# Patient Record
Sex: Female | Born: 1965
Health system: Southern US, Community
[De-identification: ages and names within clinical notes are randomized; demographics above are authoritative.]

## PROBLEM LIST (undated history)

## (undated) DIAGNOSIS — E785 Hyperlipidemia, unspecified: Secondary | ICD-10-CM

## (undated) DIAGNOSIS — E669 Obesity, unspecified: Secondary | ICD-10-CM

## (undated) DIAGNOSIS — I10 Essential (primary) hypertension: Secondary | ICD-10-CM

## (undated) DIAGNOSIS — I219 Acute myocardial infarction, unspecified: Secondary | ICD-10-CM

## (undated) DIAGNOSIS — Z803 Family history of malignant neoplasm of breast: Secondary | ICD-10-CM

## (undated) HISTORY — DX: Acute myocardial infarction, unspecified: I21.9

## (undated) HISTORY — DX: Essential (primary) hypertension: I10

## (undated) HISTORY — DX: Family history of malignant neoplasm of breast: Z80.3

## (undated) HISTORY — PX: TUBAL LIGATION: SHX77

## (undated) HISTORY — DX: Hyperlipidemia, unspecified: E78.5

## (undated) HISTORY — PX: ABDOMINAL HYSTERECTOMY: SHX81

## (undated) HISTORY — DX: Obesity, unspecified: E66.9

---

## 2001-11-30 HISTORY — PX: VESICOVAGINAL FISTULA CLOSURE W/ TAH: SUR271

## 2001-11-30 HISTORY — PX: LEFT OOPHORECTOMY: SHX1961

## 2001-11-30 HISTORY — PX: APPENDECTOMY: SHX54

## 2002-04-05 ENCOUNTER — Inpatient Hospital Stay (HOSPITAL_COMMUNITY): Admission: RE | Admit: 2002-04-05 | Discharge: 2002-04-07 | Payer: Self-pay | Admitting: Internal Medicine

## 2003-02-21 ENCOUNTER — Ambulatory Visit (HOSPITAL_COMMUNITY): Admission: RE | Admit: 2003-02-21 | Discharge: 2003-02-21 | Payer: Self-pay | Admitting: Internal Medicine

## 2003-02-21 ENCOUNTER — Encounter: Payer: Self-pay | Admitting: Internal Medicine

## 2004-08-29 ENCOUNTER — Ambulatory Visit (HOSPITAL_COMMUNITY): Admission: RE | Admit: 2004-08-29 | Discharge: 2004-08-29 | Payer: Self-pay | Admitting: Internal Medicine

## 2005-03-09 ENCOUNTER — Encounter (INDEPENDENT_AMBULATORY_CARE_PROVIDER_SITE_OTHER): Payer: Self-pay | Admitting: *Deleted

## 2005-03-09 LAB — CONVERTED CEMR LAB: Pap Smear: NORMAL

## 2006-12-22 ENCOUNTER — Ambulatory Visit: Payer: Self-pay | Admitting: Family Medicine

## 2007-03-07 ENCOUNTER — Encounter: Payer: Self-pay | Admitting: Family Medicine

## 2007-03-07 LAB — CONVERTED CEMR LAB
Basophils Absolute: 0 10*3/uL (ref 0.0–0.1)
Basophils Relative: 1 % (ref 0–1)
Eosinophils Absolute: 0.3 10*3/uL (ref 0.0–0.7)
Eosinophils Relative: 4 % (ref 0–5)
HCT: 37.8 % (ref 36.0–46.0)
Hemoglobin: 12.7 g/dL (ref 12.0–15.0)
Lymphocytes Relative: 31 % (ref 12–46)
Lymphs Abs: 1.9 10*3/uL (ref 0.7–3.3)
MCHC: 33.6 g/dL (ref 30.0–36.0)
MCV: 94 fL (ref 78.0–100.0)
Monocytes Absolute: 0.6 10*3/uL (ref 0.2–0.7)
Monocytes Relative: 10 % (ref 3–11)
Neutro Abs: 3.3 10*3/uL (ref 1.7–7.7)
Neutrophils Relative %: 55 % (ref 43–77)
Platelets: 372 10*3/uL (ref 150–400)
RBC: 4.02 M/uL (ref 3.87–5.11)
RDW: 13.7 % (ref 11.5–14.0)
WBC: 6.1 10*3/uL (ref 4.0–10.5)

## 2007-03-10 ENCOUNTER — Encounter: Payer: Self-pay | Admitting: Family Medicine

## 2007-03-10 ENCOUNTER — Ambulatory Visit: Payer: Self-pay | Admitting: Family Medicine

## 2007-03-10 ENCOUNTER — Other Ambulatory Visit: Admission: RE | Admit: 2007-03-10 | Discharge: 2007-03-10 | Payer: Self-pay | Admitting: Family Medicine

## 2007-03-10 LAB — CONVERTED CEMR LAB
Bilirubin Urine: NEGATIVE
Chlamydia, DNA Probe: NEGATIVE
GC Probe Amp, Genital: NEGATIVE
Hemoglobin, Urine: NEGATIVE
Ketones, ur: NEGATIVE mg/dL
Leukocytes, UA: NEGATIVE
Nitrite: NEGATIVE
Protein, ur: NEGATIVE mg/dL
Specific Gravity, Urine: 1.022 (ref 1.005–1.03)
Urine Glucose: NEGATIVE mg/dL
Urobilinogen, UA: 0.2 (ref 0.0–1.0)
pH: 5.5 (ref 5.0–8.0)

## 2007-03-11 ENCOUNTER — Encounter: Payer: Self-pay | Admitting: Family Medicine

## 2007-03-11 LAB — CONVERTED CEMR LAB
Candida species: NEGATIVE
Gardnerella vaginalis: NEGATIVE
Trichomonal Vaginitis: NEGATIVE

## 2007-08-19 ENCOUNTER — Encounter: Payer: Self-pay | Admitting: Family Medicine

## 2007-08-19 LAB — CONVERTED CEMR LAB
Cholesterol: 218 mg/dL — ABNORMAL HIGH (ref 0–200)
HDL: 45 mg/dL (ref >39)
LDL Cholesterol: 131 mg/dL — ABNORMAL HIGH (ref 0–99)
Total CHOL/HDL Ratio: 4.8
Triglycerides: 212 mg/dL — ABNORMAL HIGH (ref <150)
VLDL: 42 mg/dL — ABNORMAL HIGH (ref 0–40)

## 2007-08-22 ENCOUNTER — Ambulatory Visit: Payer: Self-pay | Admitting: Family Medicine

## 2007-08-29 ENCOUNTER — Ambulatory Visit: Payer: Self-pay | Admitting: Family Medicine

## 2007-11-12 ENCOUNTER — Encounter (INDEPENDENT_AMBULATORY_CARE_PROVIDER_SITE_OTHER): Payer: Self-pay | Admitting: *Deleted

## 2007-11-17 ENCOUNTER — Ambulatory Visit: Payer: Self-pay | Admitting: Family Medicine

## 2008-03-16 ENCOUNTER — Encounter: Payer: Self-pay | Admitting: Family Medicine

## 2008-03-16 LAB — CONVERTED CEMR LAB
Cholesterol: 220 mg/dL — ABNORMAL HIGH (ref 0–200)
HDL: 46 mg/dL (ref >39)
Total CHOL/HDL Ratio: 4.8

## 2008-03-19 ENCOUNTER — Ambulatory Visit: Payer: Self-pay | Admitting: Family Medicine

## 2008-03-19 ENCOUNTER — Other Ambulatory Visit: Admission: RE | Admit: 2008-03-19 | Discharge: 2008-03-19 | Payer: Self-pay | Admitting: Family Medicine

## 2008-03-19 ENCOUNTER — Encounter: Payer: Self-pay | Admitting: Family Medicine

## 2008-03-19 ENCOUNTER — Encounter (INDEPENDENT_AMBULATORY_CARE_PROVIDER_SITE_OTHER): Payer: Self-pay | Admitting: *Deleted

## 2008-03-20 ENCOUNTER — Encounter: Payer: Self-pay | Admitting: Family Medicine

## 2008-03-20 LAB — CONVERTED CEMR LAB
Chlamydia, DNA Probe: NEGATIVE
Gardnerella vaginalis: NEGATIVE
Trichomonal Vaginitis: NEGATIVE

## 2008-03-29 DIAGNOSIS — J309 Allergic rhinitis, unspecified: Secondary | ICD-10-CM | POA: Insufficient documentation

## 2008-03-29 DIAGNOSIS — E785 Hyperlipidemia, unspecified: Secondary | ICD-10-CM

## 2008-03-29 DIAGNOSIS — I10 Essential (primary) hypertension: Secondary | ICD-10-CM

## 2008-04-09 ENCOUNTER — Ambulatory Visit: Payer: Self-pay | Admitting: Family Medicine

## 2008-08-11 ENCOUNTER — Encounter: Payer: Self-pay | Admitting: Family Medicine

## 2008-08-13 LAB — CONVERTED CEMR LAB
BUN: 17 mg/dL (ref 6–23)
Basophils Relative: 0 % (ref 0–1)
Calcium: 9.8 mg/dL (ref 8.4–10.5)
Cholesterol: 212 mg/dL — ABNORMAL HIGH (ref 0–200)
Creatinine, Ser: 0.83 mg/dL (ref 0.40–1.20)
Eosinophils Absolute: 0.1 10*3/uL (ref 0.0–0.7)
Eosinophils Relative: 2 % (ref 0–5)
Glucose, Bld: 100 mg/dL — ABNORMAL HIGH (ref 70–99)
HCT: 37 % (ref 36.0–46.0)
Lymphs Abs: 1.5 10*3/uL (ref 0.7–4.0)
MCHC: 33 g/dL (ref 30.0–36.0)
MCV: 95.4 fL (ref 78.0–100.0)
Monocytes Relative: 9 % (ref 3–12)
Platelets: 364 10*3/uL (ref 150–400)
RBC: 3.88 M/uL (ref 3.87–5.11)
Triglycerides: 157 mg/dL — ABNORMAL HIGH (ref <150)
WBC: 4.8 10*3/uL (ref 4.0–10.5)

## 2008-08-15 ENCOUNTER — Telehealth: Payer: Self-pay | Admitting: Family Medicine

## 2008-08-15 ENCOUNTER — Telehealth (INDEPENDENT_AMBULATORY_CARE_PROVIDER_SITE_OTHER): Payer: Self-pay | Admitting: *Deleted

## 2008-08-20 ENCOUNTER — Encounter: Payer: Self-pay | Admitting: Family Medicine

## 2008-08-20 LAB — HM MAMMOGRAPHY: HM Mammogram: NORMAL

## 2008-09-18 ENCOUNTER — Encounter: Payer: Self-pay | Admitting: Family Medicine

## 2008-10-01 ENCOUNTER — Ambulatory Visit: Payer: Self-pay | Admitting: Family Medicine

## 2009-02-09 ENCOUNTER — Encounter: Payer: Self-pay | Admitting: Family Medicine

## 2009-02-09 LAB — CONVERTED CEMR LAB
BUN: 22 mg/dL (ref 6–23)
Bilirubin, Direct: 0.1 mg/dL (ref 0.0–0.3)
CO2: 26 meq/L (ref 19–32)
Chloride: 101 meq/L (ref 96–112)
Glucose, Bld: 94 mg/dL (ref 70–99)
Indirect Bilirubin: 0.4 mg/dL (ref 0.0–0.9)
LDL Cholesterol: 140 mg/dL — ABNORMAL HIGH (ref 0–99)
Potassium: 3.8 meq/L (ref 3.5–5.3)
Total Bilirubin: 0.5 mg/dL (ref 0.3–1.2)
Total Protein: 7.4 g/dL (ref 6.0–8.3)
Triglycerides: 181 mg/dL — ABNORMAL HIGH (ref <150)
VLDL: 36 mg/dL (ref 0–40)

## 2009-02-11 ENCOUNTER — Other Ambulatory Visit: Admission: RE | Admit: 2009-02-11 | Discharge: 2009-02-11 | Payer: Self-pay | Admitting: Family Medicine

## 2009-02-11 ENCOUNTER — Encounter: Payer: Self-pay | Admitting: Family Medicine

## 2009-02-11 ENCOUNTER — Ambulatory Visit: Payer: Self-pay | Admitting: Family Medicine

## 2009-02-11 LAB — CONVERTED CEMR LAB
Bilirubin Urine: NEGATIVE
Blood in Urine, dipstick: NEGATIVE
Ketones, urine, test strip: NEGATIVE
Urobilinogen, UA: 0.2

## 2009-02-12 ENCOUNTER — Encounter: Payer: Self-pay | Admitting: Family Medicine

## 2009-02-12 LAB — CONVERTED CEMR LAB
Candida species: NEGATIVE
GC Probe Amp, Genital: NEGATIVE
Trichomonal Vaginitis: NEGATIVE

## 2009-04-02 ENCOUNTER — Encounter: Payer: Self-pay | Admitting: Family Medicine

## 2009-04-08 ENCOUNTER — Telehealth: Payer: Self-pay | Admitting: Family Medicine

## 2009-05-13 ENCOUNTER — Ambulatory Visit: Payer: Self-pay | Admitting: Family Medicine

## 2009-05-13 DIAGNOSIS — F329 Major depressive disorder, single episode, unspecified: Secondary | ICD-10-CM

## 2009-06-05 ENCOUNTER — Encounter: Payer: Self-pay | Admitting: Family Medicine

## 2009-08-12 ENCOUNTER — Ambulatory Visit: Payer: Self-pay | Admitting: Family Medicine

## 2009-08-12 DIAGNOSIS — M2550 Pain in unspecified joint: Secondary | ICD-10-CM | POA: Insufficient documentation

## 2009-09-02 ENCOUNTER — Encounter: Payer: Self-pay | Admitting: Family Medicine

## 2009-09-12 ENCOUNTER — Ambulatory Visit: Payer: Self-pay | Admitting: Family Medicine

## 2009-09-12 DIAGNOSIS — G43909 Migraine, unspecified, not intractable, without status migrainosus: Secondary | ICD-10-CM

## 2009-09-16 ENCOUNTER — Ambulatory Visit: Payer: Self-pay | Admitting: Family Medicine

## 2009-09-16 ENCOUNTER — Telehealth: Payer: Self-pay | Admitting: Family Medicine

## 2009-09-20 ENCOUNTER — Encounter: Payer: Self-pay | Admitting: Family Medicine

## 2009-09-24 ENCOUNTER — Encounter: Payer: Self-pay | Admitting: Family Medicine

## 2009-10-01 ENCOUNTER — Encounter: Payer: Self-pay | Admitting: Family Medicine

## 2009-10-03 ENCOUNTER — Telehealth: Payer: Self-pay | Admitting: Family Medicine

## 2009-11-11 ENCOUNTER — Ambulatory Visit: Payer: Self-pay | Admitting: Family Medicine

## 2009-11-11 LAB — CONVERTED CEMR LAB
CO2: 24 meq/L (ref 19–32)
Calcium: 9.9 mg/dL (ref 8.4–10.5)
Creatinine, Ser: 0.87 mg/dL (ref 0.40–1.20)
Glucose, Bld: 97 mg/dL (ref 70–99)

## 2009-11-25 DIAGNOSIS — E669 Obesity, unspecified: Secondary | ICD-10-CM

## 2010-02-20 ENCOUNTER — Telehealth: Payer: Self-pay | Admitting: Family Medicine

## 2010-02-24 ENCOUNTER — Ambulatory Visit: Payer: Self-pay | Admitting: Family Medicine

## 2010-02-24 ENCOUNTER — Other Ambulatory Visit: Admission: RE | Admit: 2010-02-24 | Discharge: 2010-02-24 | Payer: Self-pay | Admitting: Family Medicine

## 2010-02-24 ENCOUNTER — Telehealth: Payer: Self-pay | Admitting: Family Medicine

## 2010-02-24 LAB — CONVERTED CEMR LAB
Basophils Absolute: 0 10*3/uL (ref 0.0–0.1)
Cholesterol: 213 mg/dL — ABNORMAL HIGH (ref 0–200)
Hemoglobin: 13.4 g/dL (ref 12.0–15.0)
Lymphocytes Relative: 29 % (ref 12–46)
Neutro Abs: 3.2 10*3/uL (ref 1.7–7.7)
Platelets: 394 10*3/uL (ref 150–400)
Potassium: 4 meq/L (ref 3.5–5.3)
RDW: 13.8 % (ref 11.5–15.5)
Sodium: 138 meq/L (ref 135–145)
Total CHOL/HDL Ratio: 5.8
Triglycerides: 196 mg/dL — ABNORMAL HIGH (ref <150)
VLDL: 39 mg/dL (ref 0–40)

## 2010-02-25 ENCOUNTER — Telehealth: Payer: Self-pay | Admitting: Family Medicine

## 2010-02-25 LAB — CONVERTED CEMR LAB
ALT: 13 units/L (ref 0–35)
AST: 18 units/L (ref 0–37)
Albumin: 4.5 g/dL (ref 3.5–5.2)
Alkaline Phosphatase: 34 units/L — ABNORMAL LOW (ref 39–117)
Bilirubin, Direct: 0.1 mg/dL (ref 0.0–0.3)
Hgb A1c MFr Bld: 6.2 % — ABNORMAL HIGH (ref 4.6–6.1)
Indirect Bilirubin: 0.3 mg/dL (ref 0.0–0.9)
Total Bilirubin: 0.4 mg/dL (ref 0.3–1.2)
Total Protein: 7.4 g/dL (ref 6.0–8.3)
Vit D, 25-Hydroxy: 23 ng/mL — ABNORMAL LOW (ref 30–89)

## 2010-04-16 ENCOUNTER — Telehealth: Payer: Self-pay | Admitting: Physician Assistant

## 2010-04-22 ENCOUNTER — Telehealth: Payer: Self-pay | Admitting: Family Medicine

## 2010-06-09 ENCOUNTER — Ambulatory Visit: Payer: Self-pay | Admitting: Family Medicine

## 2010-06-09 DIAGNOSIS — R7303 Prediabetes: Secondary | ICD-10-CM

## 2010-06-16 LAB — CONVERTED CEMR LAB
ALT: 16 units/L (ref 0–35)
BUN: 16 mg/dL (ref 6–23)
Bilirubin, Direct: 0.1 mg/dL (ref 0.0–0.3)
Cholesterol: 212 mg/dL — ABNORMAL HIGH (ref 0–200)
Glucose, Bld: 93 mg/dL (ref 70–99)
Indirect Bilirubin: 0.5 mg/dL (ref 0.0–0.9)
Potassium: 3.8 meq/L (ref 3.5–5.3)
Total Bilirubin: 0.6 mg/dL (ref 0.3–1.2)
VLDL: 42 mg/dL — ABNORMAL HIGH (ref 0–40)

## 2010-07-07 ENCOUNTER — Ambulatory Visit: Payer: Self-pay | Admitting: Family Medicine

## 2010-07-09 ENCOUNTER — Encounter: Payer: Self-pay | Admitting: Family Medicine

## 2010-10-06 ENCOUNTER — Encounter: Payer: Self-pay | Admitting: Family Medicine

## 2010-10-09 ENCOUNTER — Telehealth (INDEPENDENT_AMBULATORY_CARE_PROVIDER_SITE_OTHER): Payer: Self-pay | Admitting: *Deleted

## 2010-10-11 LAB — CONVERTED CEMR LAB
Albumin: 4.6 g/dL (ref 3.5–5.2)
Bilirubin, Direct: <0.1 mg/dL (ref 0.0–0.3)
CO2: 26 meq/L (ref 19–32)
Calcium: 9.6 mg/dL (ref 8.4–10.5)
Chloride: 103 meq/L (ref 96–112)
Glucose, Bld: 91 mg/dL (ref 70–99)
Hgb A1c MFr Bld: 5.5 % (ref <5.7)
LDL Cholesterol: 123 mg/dL — ABNORMAL HIGH (ref 0–99)
Potassium: 3.7 meq/L (ref 3.5–5.3)
Sodium: 138 meq/L (ref 135–145)
Total Bilirubin: 0.7 mg/dL (ref 0.3–1.2)
Total CHOL/HDL Ratio: 3.9
VLDL: 29 mg/dL (ref 0–40)

## 2010-10-13 ENCOUNTER — Ambulatory Visit: Payer: Self-pay | Admitting: Family Medicine

## 2010-10-13 DIAGNOSIS — R928 Other abnormal and inconclusive findings on diagnostic imaging of breast: Secondary | ICD-10-CM | POA: Insufficient documentation

## 2010-10-13 DIAGNOSIS — M25519 Pain in unspecified shoulder: Secondary | ICD-10-CM | POA: Insufficient documentation

## 2010-10-14 ENCOUNTER — Encounter: Payer: Self-pay | Admitting: Family Medicine

## 2010-10-16 ENCOUNTER — Telehealth: Payer: Self-pay | Admitting: Family Medicine

## 2010-10-19 DIAGNOSIS — J209 Acute bronchitis, unspecified: Secondary | ICD-10-CM

## 2010-10-21 ENCOUNTER — Encounter: Payer: Self-pay | Admitting: Family Medicine

## 2010-12-21 ENCOUNTER — Encounter: Payer: Self-pay | Admitting: Family Medicine

## 2010-12-29 ENCOUNTER — Telehealth: Payer: Self-pay | Admitting: Family Medicine

## 2010-12-30 ENCOUNTER — Ambulatory Visit: Admit: 2010-12-30 | Payer: Self-pay | Admitting: Family Medicine

## 2010-12-30 NOTE — Letter (Signed)
Summary: FMLA PAPER  FMLA PAPER   Imported By: Lind Guest 07/09/2010 10:34:20  _____________________________________________________________________  External Attachment:    Type:   Image     Comment:   External Document

## 2010-12-30 NOTE — Progress Notes (Signed)
Summary: lab order  Phone Note Call from Patient   Summary of Call: pt needs lab work faxed over so she can do sat. [phone] Initial call taken by: Rudene Anda,  February 20, 2010 10:34 AM  Follow-up for Phone Call        ORDER SENT Follow-up by: Adella Hare LPN,  February 20, 2010 11:32 AM

## 2010-12-30 NOTE — Progress Notes (Signed)
  Phone Note From Pharmacy   Caller: [facility] Summary of Call: lipitor requires pa Initial call taken by: Adella Hare LPN,  February 25, 2010 12:10 PM  Follow-up for Phone Call        She wasn't able to use the card yesterday. The Lipitor needed a PA and they said that we needed to call the insurance company to see why the card wasn't working. I told you that you may just want to change it. Follow-up by: Everitt Amber LPN,  February 25, 2010 4:03 PM  Additional Follow-up for Phone Call Additional follow up Details #1::        changed top pravastatin, I will erx and pls let her know Additional Follow-up by: Syliva Overman MD,  February 25, 2010 8:39 PM    Additional Follow-up for Phone Call Additional follow up Details #2::    patient aware Follow-up by: Everitt Amber LPN,  February 26, 2010 3:06 PM  New/Updated Medications: PRAVASTATIN SODIUM 40 MG TABS (PRAVASTATIN SODIUM) Take 1 tab by mouth at bedtime Prescriptions: PRAVASTATIN SODIUM 40 MG TABS (PRAVASTATIN SODIUM) Take 1 tab by mouth at bedtime  #30 x 3   Entered and Authorized by:   Syliva Overman MD   Signed by:   Syliva Overman MD on 02/25/2010   Method used:   Electronically to        [facility] (retail)       [address]       [place], [place]  [address]       Ph: [phone]       Fax: [phone]   RxID:   [id number]

## 2010-12-30 NOTE — Assessment & Plan Note (Signed)
Summary: WOULD LIKE TO DISCUSS FMLA PAPERS YOU FILLED OUT/SLJ   Vital Signs:  Patient profile:   45 year old female Menstrual status:  hysterectomy Height:      66.5 inches Weight:      180 pounds BMI:     28.72 O2 Sat:      98 % Pulse rate:   72 / minute Pulse rhythm:   regular Resp:     16 per minute BP sitting:   120 / 80  (left arm)  Vitals Entered By: Everitt Amber LPN (July 07, 2010 2:07 PM)  Nutrition Counseling: Patient's BMI is greater than 25 and therefore counseled on weight management options. CC: FMLA papers   Primary Care Darneshia Demary:  Syliva Overman MD  CC:  FMLA papers.  History of Present Illness: Reports  that tshe has been doing fairly well. She continues to be stressed helping to caRE FOR HER GRANDSON, HER DAUGHTER'S HEALTHIS IMOPROVING SLOWLY, ABOUT WHICH SHE IS HAPPY. Denies recent fever or chills. Denies sinus pressure, nasal congestion , ear pain or sore throat. Denies chest congestion, or cough productive of sputum. Denies chest pain, palpitations, PND, orthopnea or leg swelling. Denies abdominal pain, nausea, vomitting, diarrhea or constipation. Denies change in bowel movements or bloody stool. Denies dysuria , frequency, incontinence or hesitancy. Denies  joint pain, swelling, or reduced mobility. Denies headaches, vertigo, seizures. Denies depression, anxiety or insomnia. Denies  rash, lesions, or itch. she is also requesting FMLA forms to be completed which will bedue on October     Allergies: No Known Drug Allergies  Review of Systems      See HPI General:  Complains of fatigue. Eyes:  Complains of blurring and discharge. Endo:  Denies cold intolerance, excessive thirst, excessive urination, and polyuria. Heme:  Denies abnormal bruising and bleeding. Allergy:  Complains of seasonal allergies; denies persistent infections.  Physical Exam  General:  Well-developed,well-nourished,in no acute distress; alert,appropriate and cooperative  throughout examination HEENT: No facial asymmetry,  EOMI, No sinus tenderness, TM's Clear, oropharynx  pink and moist.   Chest: Clear to auscultation bilaterally.  CVS: S1, S2, No murmurs, No S3.   Abd: Soft, Nontender.  MS: Adequate ROM spine, hips, shoulders and knees.  Ext: No edema.   CNS: CN 2-12 intact, power tone and sensation normal throughout.   Skin: Intact, no visible lesions or rashes.  Psych: Good eye contact, normal affect.  Memory intact, not anxious or depressed appearing.    Impression & Recommendations:  Problem # 1:  IMPAIRED FASTING GLUCOSE (ICD-790.21) Assessment Improved  Labs Reviewed: Creat: 0.78 (06/14/2010)   HBA1C 5.7 , pt applauded on improvement, and adbvised to follow a low carb and reduced glucose diet  Problem # 2:  OVERWEIGHT (ICD-278.02) Assessment: Improved  Ht: 66.5 (07/07/2010)   Wt: 180 (07/07/2010)   BMI: 28.72 (07/07/2010)  Problem # 3:  DEPRESSION (ICD-311) Assessment: Improved  Problem # 4:  HYPERLIPIDEMIA (ICD-272.4) Assessment: Unchanged  Her updated medication list for this problem includes:    Pravastatin Sodium 40 Mg Tabs (Pravastatin sodium) .Marland Kitchen... Take 1 tab by mouth at bedtime  Labs Reviewed: SGOT: 16 (06/14/2010)   SGPT: 16 (06/14/2010)   HDL:50 (06/14/2010), 37 (02/22/2010)  LDL:120 (06/14/2010), 137 (02/22/2010)  Chol:212 (06/14/2010), 213 (02/22/2010)  Trig:210 (06/14/2010), 196 (02/22/2010) low fat diet stressed ansd pt to take med as prescribed also  Complete Medication List: 1)  Ibuprofen 800 Mg Tabs (Ibuprofen) .... One tab by mouth two times a day as needed 2)  Fioricet 50-325-40 Mg Tabs (Butalbital-apap-caffeine) .... One tablet every 6 to 8 hours as needed for severe headache 3)  Prinzide 20-12.5 Mg Tabs (Lisinopril-hydrochlorothiazide) .... One and a half tablets daily 4)  Tylenol Pm Extra Strength 500-25 Mg Tabs (Diphenhydramine-apap (sleep)) .... Take 1 tab by mouth at bedtime 5)  Pravastatin Sodium 40 Mg  Tabs (Pravastatin sodium) .... Take 1 tab by mouth at bedtime  Patient Instructions: 1)  f/u as before. 2)  labs as previously ordered.

## 2010-12-30 NOTE — Progress Notes (Signed)
Summary: papers  Phone Note Call from Patient   Summary of Call: wants to know if her papers are ready work is asking her  call back at [phone] Initial call taken by: Lind Guest,  Apr 22, 2010 2:17 PM  Follow-up for Phone Call        returned call, left message Follow-up by: Adella Hare LPN,  Apr 22, 2010 2:31 PM  Additional Follow-up for Phone Call Additional follow up Details #1::        advised patient not able to complete forms due to no medical grounds to put her on leave, she currently has fmla forms for blood pressure but blood pressure at this time is controlled. if she needs to be out for stress patient will have to come for ov then get referal to mental health for this. we can however fill out fmla forms stating she is out taking care of daughter, patient states nevermind unless papers are on her she will not get paid Additional Follow-up by: Adella Hare LPN,  Apr 22, 2010 2:49 PM

## 2010-12-30 NOTE — Medication Information (Signed)
Summary: Tax adviser   Imported By: Lind Guest 10/14/2010 07:49:28  _____________________________________________________________________  External Attachment:    Type:   Image     Comment:   External Document

## 2010-12-30 NOTE — Assessment & Plan Note (Signed)
Summary: physical   Vital Signs:  Patient profile:   45 year old female Menstrual status:  hysterectomy Height:      66.5 inches Weight:      186.25 pounds BMI:     29.72 O2 Sat:      96 % Pulse rate:   84 / minute Pulse rhythm:   regular Resp:     16 per minute BP sitting:   112 / 80  (left arm) Cuff size:   large  Vitals Entered By: Everitt Amber LPN (February 24, 2010 8:48 AM)  Nutrition Counseling: Patient's BMI is greater than 25 and therefore counseled on weight management options.  Vision Screening:Left eye w/o correction: 20 / 25 Right Eye w/o correction: 20 / 25 Both eyes w/o correction:  20/ 20  Color vision testing: normal      Vision Entered By: Everitt Amber LPN (February 24, 2010 8:48 AM)   Primary Care Provider:  Syliva Overman MD   History of Present Illness: Reports  that she has been doing fairly well. Her main health issue is stress due primartily to the irresponsible behavior of an adult daughter with 3 children , whoi is assuming responsibility neither ofr the children or for herself. Denies recent fever or chills. Denies sinus pressure, nasal congestion , ear pain or sore throat. Denies chest congestion, or cough productive of sputum. Denies chest pain, palpitations, PND, orthopnea or leg swelling. Denies abdominal pain, nausea, vomitting, diarrhea or constipation. Denies change in bowel movements or bloody stool. Denies dysuria , frequency, incontinence or hesitancy. Denies  joint pain, swelling, or reduced mobility. Denies headaches, vertigo, seizures.  Denies  rash, lesions, or itch. the pt has no regular time dedicated to exercise, however she is very active, she tries to follow a healthy diet.     Current Medications (verified): 1)  Ibuprofen 800 Mg  Tabs (Ibuprofen) .... One Tab By Mouth Two Times A Day As Needed 2)  Fioricet 50-325-40 Mg Tabs (Butalbital-Apap-Caffeine) .... One Tablet Every 6 To 8 Hours As Needed For Severe Headache 3)   Prinzide 20-12.5 Mg Tabs (Lisinopril-Hydrochlorothiazide) .... One and A Half Tablets Daily 4)  Tylenol Pm Extra Strength 500-25 Mg Tabs (Diphenhydramine-Apap (Sleep)) .... Take 1 Tab By Mouth At Bedtime  Allergies (verified): No Known Drug Allergies  Review of Systems      See HPI General:  Complains of fatigue. Eyes:  Denies blurring, discharge, and red eye. MS:  Denies joint pain and joint swelling. Derm:  Denies itching, lesion(s), and rash. Neuro:  Complains of headaches; denies poor balance, seizures, and sensation of room spinning; occaSIONAL. Psych:  Complains of anxiety, depression, and easily tearful; denies mental problems, panic attacks, sense of great danger, suicidal thoughts/plans, thoughts of violence, and unusual visions or sounds. Endo:  Denies cold intolerance, excessive hunger, excessive thirst, excessive urination, heat intolerance, polyuria, and weight change. Heme:  Denies abnormal bruising and bleeding. Allergy:  Complains of seasonal allergies; mild.  Physical Exam  General:  Well-developed,well-nourished,in no acute distress; alert,appropriate and cooperative throughout examination Head:  Normocephalic and atraumatic without obvious abnormalities. No apparent alopecia or balding. Eyes:  No corneal or conjunctival inflammation noted. EOMI. Perrla. Funduscopic exam benign, without hemorrhages, exudates or papilledema. Vision grossly normal. Ears:  External ear exam shows no significant lesions or deformities.  Otoscopic examination reveals clear canals, tympanic membranes are intact bilaterally without bulging, retraction, inflammation or discharge. Hearing is grossly normal bilaterally. Nose:  External nasal examination shows no deformity or inflammation.  Nasal mucosa are pink and moist without lesions or exudates. Mouth:  Oral mucosa and oropharynx without lesions or exudates.  Teeth in good repair. Neck:  No deformities, masses, or tenderness noted. Chest Wall:   No deformities, masses, or tenderness noted. Breasts:  No mass, nodules, thickening, tenderness, bulging, retraction, inflamation, nipple discharge or skin changes noted.   Lungs:  Normal respiratory effort, chest expands symmetrically. Lungs are clear to auscultation, no crackles or wheezes. Heart:  Normal rate and regular rhythm. S1 and S2 normal without gallop, murmur, click, rub or other extra sounds. Abdomen:  Bowel sounds positive,abdomen soft and non-tender without masses, organomegaly or hernias noted. Rectal:  No external abnormalities noted. Normal sphincter tone. No rectal masses or tenderness.guaic negative stool  Genitalia:  normal introitus, no external lesions, no vaginal discharge, mucosa pink and moist, and no adnexal masses or tenderness. uterus is absent.  Msk:  No deformity or scoliosis noted of thoracic or lumbar spine.   Pulses:  R and L carotid,radial,femoral,dorsalis pedis and posterior tibial pulses are full and equal bilaterally Extremities:  No clubbing, cyanosis, edema, or deformity noted with normal full range of motion of all joints.   Neurologic:  No cranial nerve deficits noted. Station and gait are normal. Plantar reflexes are down-going bilaterally. DTRs are symmetrical throughout. Sensory, motor and coordinative functions appear intact. Skin:  Intact without suspicious lesions or rashes Cervical Nodes:  No lymphadenopathy noted Axillary Nodes:  No palpable lymphadenopathy Inguinal Nodes:  No significant adenopathy Psych:  Cognition and judgment appear intact. Alert and cooperative with normal attention span and concentration. No apparent delusions, illusions, hallucinations. mild depression and anxiety present   Impression & Recommendations:  Problem # 1:  OVERWEIGHT (ICD-278.02) Assessment Deteriorated  Ht: 66.5 (02/24/2010)   Wt: 186.25 (02/24/2010)   BMI: 29.72 (02/24/2010)  Problem # 2:  HYPERTENSION (ICD-401.9) Assessment: Unchanged  Her updated  medication list for this problem includes:    Prinzide 20-12.5 Mg Tabs (Lisinopril-hydrochlorothiazide) ..... One and a half tablets daily  BP today: 112/80 Prior BP: 120/84 (11/11/2009)  Labs Reviewed: K+: 4.0 (02/22/2010) Creat: : 0.73 (02/22/2010)   Chol: 213 (02/22/2010)   HDL: 37 (02/22/2010)   LDL: 137 (02/22/2010)   TG: 196 (02/22/2010)  Problem # 3:  HYPERLIPIDEMIA (ICD-272.4) Assessment: Unchanged  Her updated medication list for this problem includes:    Lipitor 10 Mg Tabs (Atorvastatin calcium) .Marland Kitchen... Take 1 tab by mouth at bedtime  Labs Reviewed: SGOT: 15 (02/09/2009)   SGPT: 19 (02/09/2009)   HDL:37 (02/22/2010), 42 (02/09/2009)  LDL:137 (02/22/2010), 140 (02/09/2009)  Chol:213 (02/22/2010), 218 (02/09/2009)  Trig:196 (02/22/2010), 181 (02/09/2009)  Problem # 4:  ALLERGIC RHINITIS (ICD-477.9)  Complete Medication List: 1)  Ibuprofen 800 Mg Tabs (Ibuprofen) .... One tab by mouth two times a day as needed 2)  Fioricet 50-325-40 Mg Tabs (Butalbital-apap-caffeine) .... One tablet every 6 to 8 hours as needed for severe headache 3)  Prinzide 20-12.5 Mg Tabs (Lisinopril-hydrochlorothiazide) .... One and a half tablets daily 4)  Tylenol Pm Extra Strength 500-25 Mg Tabs (Diphenhydramine-apap (sleep)) .... Take 1 tab by mouth at bedtime 5)  Lipitor 10 Mg Tabs (Atorvastatin calcium) .... Take 1 tab by mouth at bedtime  Other Orders: T-Hepatic Function [id number]) T-Lipid Profile [id number]) Pap Smear (29562) Hemoccult Guaiac-1 spec.(in office) (13086)  Patient Instructions: 1)  Please schedule a follow-up appointment in 3 months. 2)  It is important that you exercise regularly at least 20 minutes 5 times a week. If you develop chest  pain, have severe difficulty breathing, or feel very tired , stop exercising immediately and seek medical attention. 3)  You need to lose weight. Consider a lower calorie diet and regular exercise.  4)  pls follow a low fat diet , and you will  be started  on med for cholesterol Prescriptions: IBUPROFEN 800 MG  TABS (IBUPROFEN) one tab by mouth two times a day as needed  #60 x 4   Entered by:   Everitt Amber LPN   Authorized by:   Syliva Overman MD   Signed by:   Everitt Amber LPN on 60/45/4098   Method used:   Electronically to        [facility] (retail)       [address]       [place], [place]  [address]       Ph: [phone]       Fax: [phone]   RxID:   [id number] FIORICET 50-325-40 MG TABS [facility]) one tablet every 6 to 8 hours as needed for severe headache  #30 x 4   Entered by:   Everitt Amber LPN   Authorized by:   Syliva Overman MD   Signed by:   Everitt Amber LPN on 53/66/4403   Method used:   Electronically to        [facility] (retail)       [address]       [place], [place]  [address]       Ph: [phone]       Fax: [phone]   RxID:   [id number] PRINZIDE 20-12.5 MG TABS (LISINOPRIL-HYDROCHLOROTHIAZIDE) one and a half tablets daily  #45 x 4   Entered by:   Everitt Amber LPN   Authorized by:   Syliva Overman MD   Signed by:   Everitt Amber LPN on 73/22/0254   Method used:   Electronically to        [facility] (retail)       [address]       [place], [place]  [address]       Ph: [phone]       Fax: [phone]   RxID:   [id number] LIPITOR 10 MG TABS (ATORVASTATIN CALCIUM) Take 1 tab by mouth at bedtime  #30 x 3   Entered and Authorized by:   Syliva Overman MD   Signed by:   Syliva Overman MD on 02/24/2010   Method used:   Electronically to        [facility] (retail)       [address]       [place], [place]  [address]       Ph: [phone]       Fax: [phone]   RxID:   [id number]   Laboratory Results    Stool - Occult Blood Hemmoccult #1: negative Date: 02/24/2010 Comments: [id number] 9 r 06/2010 118/1012      Preventive  Care Screening  Last Tetanus Booster:    Date:  04/09/2008    Results:  Tdap

## 2010-12-30 NOTE — Assessment & Plan Note (Signed)
Summary: office visit   Vital Signs:  Patient profile:   45 year old female Menstrual status:  hysterectomy Height:      66.5 inches Weight:      186.50 pounds BMI:     29.76 O2 Sat:      97 % on Room air Pulse rate:   83 / minute Pulse rhythm:   regular Resp:     16 per minute BP sitting:   130 / 84  (left arm)  Vitals Entered By: Mauricia Area CMA (October 13, 2010 11:11 AM)  Nutrition Counseling: Patient's BMI is greater than 25 and therefore counseled on weight management options.  O2 Flow:  Room air CC: Right shoulder and right elbow pain. Chest congestion, producing brownish phlegm Comments Did not bring meds   CC:  Right shoulder and right elbow pain. Chest congestion and producing brownish phlegm.  Allergies (verified): No Known Drug Allergies  Review of Systems      See HPI Resp:  Complains of cough and sputum productive; 1 wek h/o chest congestion with cough productive of Clemon sputum. MS:  Complains of joint pain; denies low back pain and mid back pain; right shoulder pain and popping x 2 weeks, felt the initial inj when shutting her patio door, still has full ROM , though with some restrictions, eg when putting on her coat, also experiencing right elbow pain.  Physical Exam  General:  Well-developed,well-nourished,in no acute distress; alert,appropriate and cooperative throughout examination HEENT: No facial asymmetry,  EOMI, No sinus tenderness, TM's Clear, oropharynx  pink and moist.   Chest: decreased air entry, bilateral crackles CVS: S1, S2, No murmurs, No S3.   Abd: Soft, Nontender.  MS: Adequate ROM spine, hips,  and knees. decreased in right shoulder Ext: No edema.   CNS: CN 2-12 intact, power tone and sensation normal throughout.   Skin: Intact, no visible lesions or rashes.  Psych: Good eye contact, normal affect.  Memory intact, not anxious or depressed appearing.    Impression & Recommendations:  Problem # 1:  SHOULDER PAIN, RIGHT  (ICD-719.41) Assessment Comment Only  Her updated medication list for this problem includes:    Ibuprofen 800 Mg Tabs (Ibuprofen) ..... One tab by mouth two times a day as needed    Fioricet 50-325-40 Mg Tabs (Butalbital-apap-caffeine) ..... One tablet every 6 to 8 hours as needed for severe headache  Orders: Depo- Medrol 80mg  (J1040) Ketorolac-Toradol 15mg  (U1324) Admin of Therapeutic Inj  intramuscular or subcutaneous (40102) Orthopedic Referral (Ortho)  Problem # 2:  ABNORMAL MAMMOGRAM, RIGHT BREAST (ICD-793.80) Assessment: Comment Only  Orders: Radiology Referral (Radiology)  Problem # 3:  HYPERTENSION (ICD-401.9) Assessment: Unchanged  Her updated medication list for this problem includes:    Prinzide 20-12.5 Mg Tabs (Lisinopril-hydrochlorothiazide) ..... One and a half tablets daily  BP today: 130/84 Prior BP: 120/80 (07/07/2010)  Labs Reviewed: K+: 3.7 (10/11/2010) Creat: : 0.73 (10/11/2010)   Chol: 205 (10/11/2010)   HDL: 53 (10/11/2010)   LDL: 123 (10/11/2010)   TG: 147 (10/11/2010)  Problem # 4:  HYPERLIPIDEMIA (ICD-272.4) Assessment: Unchanged  The following medications were removed from the medication list:    Pravastatin Sodium 40 Mg Tabs (Pravastatin sodium) .Marland Kitchen... Take 1 tab by mouth at bedtime    Pravastatin Sodium 40 Mg Tabs (Pravastatin sodium) .Marland Kitchen..Marland Kitchen Two tablets at bedtime Her updated medication list for this problem includes:    Pravastatin Sodium 40 Mg Tabs (Pravastatin sodium) .Marland Kitchen..Marland Kitchen Two at bedtime  Labs Reviewed: SGOT: 16 (10/11/2010)  SGPT: 13 (10/11/2010)   HDL:53 (10/11/2010), 50 (06/14/2010)  LDL:123 (10/11/2010), 120 (06/14/2010)  Chol:205 (10/11/2010), 212 (06/14/2010)  Trig:147 (10/11/2010), 210 (06/14/2010)  Problem # 5:  ACUTE BRONCHITIS (ICD-466.0) Assessment: Comment Only  Her updated medication list for this problem includes:    Tessalon Perles 100 Mg Caps (Benzonatate) .Marland Kitchen... Take 1 capsule by mouth three times a day    Penicillin V  Potassium 500 Mg Tabs (Penicillin v potassium) .Marland Kitchen... Take 1 tablet by mouth three times a day    Tussionex Pennkinetic Er 10-8 Mg/62ml Lqcr (Hydrocod polst-chlorphen polst) ..... One teaspoon twice daily as needed for uncontrolled cough  Complete Medication List: 1)  Ibuprofen 800 Mg Tabs (Ibuprofen) .... One tab by mouth two times a day as needed 2)  Fioricet 50-325-40 Mg Tabs (Butalbital-apap-caffeine) .... One tablet every 6 to 8 hours as needed for severe headache 3)  Prinzide 20-12.5 Mg Tabs (Lisinopril-hydrochlorothiazide) .... One and a half tablets daily 4)  Tylenol Pm Extra Strength 500-25 Mg Tabs (Diphenhydramine-apap (sleep)) .... Take 1 tab by mouth at bedtime 5)  Tessalon Perles 100 Mg Caps (Benzonatate) .... Take 1 capsule by mouth three times a day 6)  Penicillin V Potassium 500 Mg Tabs (Penicillin v potassium) .... Take 1 tablet by mouth three times a day 7)  Fluconazole 150 Mg Tabs (Fluconazole) .... Take 1 tablet by mouth once a day as needed vaginall itch 8)  Pravastatin Sodium 40 Mg Tabs (Pravastatin sodium) .... Two at bedtime 9)  Tussionex Pennkinetic Er 10-8 Mg/53ml Lqcr (Hydrocod polst-chlorphen polst) .... One teaspoon twice daily as needed for uncontrolled cough  Patient Instructions: 1)  Please schedule a follow-up appointment in 4 months. 2)  It is important that you exercise regularly at least 20 minutes 5 times a week. If you develop chest pain, have severe difficulty breathing, or feel very tired , stop exercising immediately and seek medical attention. 3)  You need to lose weight. Consider a lower calorie diet and regular exercise.  4)  Pls cut back on fried  foods and red meats, your cholesterol is high. 5)  Dose inc to TWO pravachol at bedtime. 6)  Meds sent in fror bronchitis and right shoulder pain, you will be referred to dr Madelon Lips also. 7)  Injections today Prescriptions: PRAVASTATIN SODIUM 40 MG TABS (PRAVASTATIN SODIUM) two at bedtime  #180 x 1   Entered  and Authorized by:   Syliva Overman MD   Signed by:   Syliva Overman MD on 10/13/2010   Method used:   Printed then faxed to ...       [facility] (retail)       [address]       [place], [place]  [address]       Ph: [phone]       Fax: [phone]   RxID:   [id number] FLUCONAZOLE 150 MG TABS (FLUCONAZOLE) Take 1 tablet by mouth once a day as needed vaginall itch  #3 x 0   Entered and Authorized by:   Syliva Overman MD   Signed by:   Syliva Overman MD on 10/13/2010   Method used:   Electronically to        [facility] (retail)       [address]       [place], [place]  [address]  Ph: [phone]       Fax: [phone]   RxID:   [id number] PENICILLIN V POTASSIUM 500 MG TABS (PENICILLIN V POTASSIUM) Take 1 tablet by mouth three times a day  #21 x 0   Entered and Authorized by:   Syliva Overman MD   Signed by:   Syliva Overman MD on 10/13/2010   Method used:   Electronically to        [facility] (retail)       [address]       [place], [place]  [address]       Ph: [phone]       Fax: [phone]   RxID:   [id number] PREDNISONE (PAK) 5 MG TABS (PREDNISONE) Use as directed  #21 x 0   Entered and Authorized by:   Syliva Overman MD   Signed by:   Syliva Overman MD on 10/13/2010   Method used:   Electronically to        [facility] (retail)       [address]       [place], [place]  [address]       Ph: [phone]       Fax: [phone]   RxID:   [id number] TESSALON PERLES 100 MG CAPS (BENZONATATE) Take 1 capsule by mouth three times a day  #21 x 0   Entered and Authorized by:   Syliva Overman MD   Signed by:   Syliva Overman MD on 10/13/2010   Method used:   Electronically to        [facility] (retail)       [address]       [place], [place]  [address]       Ph: [phone]        Fax: [phone]   RxID:   [id number] PRAVASTATIN SODIUM 40 MG TABS (PRAVASTATIN SODIUM) two tablets at bedtime  #60 x 3   Entered and Authorized by:   Syliva Overman MD   Signed by:   Syliva Overman MD on 10/13/2010   Method used:   Printed then faxed to ...       [facility] (retail)       [address]       [place], [place]  [address]       Ph: [phone]       Fax: [phone]   RxID:   [id number]    Medication Administration  Injection # 1:    Medication: Depo- Medrol 80mg     Diagnosis: SHOULDER PAIN, RIGHT (ICD-719.41)    Route: IM    Site: RUOQ gluteus    Exp Date: 07/12    Lot #: Gunnar [id number]    Mfr: Pharmacia    Patient tolerated injection without complications    Given by: Adella Hare LPN (October 13, 2010 11:51 AM)  Injection # 2:    Medication: Ketorolac-Toradol 15mg     Diagnosis: SHOULDER PAIN, RIGHT (ICD-719.41)    Route: IM    Site: LUOQ gluteus    Exp Date: 10/01/2011    Lot #: [id number]    Mfr: [email or web address]    Comments: toradol 60mg  given    Patient tolerated injection without complications    Given by: Marijean Niemann  Boothe LPN (October 13, 2010 11:51 AM)  Orders Added: 1)  Est. Patient Level IV [[address]] 2)  Depo- Medrol 80mg  [J1040] 3)  Ketorolac-Toradol 15mg  [J1885] 4)  Admin of Therapeutic Inj  intramuscular or subcutaneous [96[address]] 5)  Radiology Referral [Radiology] 6)  Orthopedic Referral [Ortho]     Medication Administration  Injection # 1:    Medication: Depo- Medrol 80mg     Diagnosis: SHOULDER PAIN, RIGHT (ICD-719.41)    Route: IM    Site: RUOQ gluteus    Exp Date: 07/12    Lot #: Gunnar Bulla    Mfr: Pharmacia    Patient tolerated injection without complications    Given by: Adella Hare LPN (October 13, 2010 11:51 AM)  Injection # 2:    Medication: Ketorolac-Toradol 15mg     Diagnosis: SHOULDER PAIN, RIGHT (ICD-719.41)    Route: IM    Site: LUOQ gluteus    Exp Date: 10/01/2011    Lot #: [id number]     Mfr: [email or web address]    Comments: toradol 60mg  given    Patient tolerated injection without complications    Given by: Adella Hare LPN (October 13, 2010 11:51 AM)  Orders Added: 1)  Est. Patient Level IV [[address]] 2)  Depo- Medrol 80mg  [J1040] 3)  Ketorolac-Toradol 15mg  [J1885] 4)  Admin of Therapeutic Inj  intramuscular or subcutaneous [96372] 5)  Radiology Referral [Radiology] 6)  Orthopedic Referral [Ortho]

## 2010-12-30 NOTE — Assessment & Plan Note (Signed)
Summary: office visit   Vital Signs:  Patient profile:   45 year old female Menstrual status:  hysterectomy Height:      66.5 inches Weight:      184 pounds BMI:     29.36 O2 Sat:      98 % Pulse rate:   75 / minute Pulse rhythm:   regular Resp:     16 per minute BP sitting:   118 / 80  (left arm) Cuff size:   large  Vitals Entered By: Everitt Amber LPN (June 09, 2010 1:25 PM)  Nutrition Counseling: Patient's BMI is greater than 25 and therefore counseled on weight management options. CC: Follow up chronic problems   Primary Care Provider:  Syliva Overman MD  CC:  Follow up chronic problems.  History of Present Illness: Reports  that she has been doing better, since her daughter's health has improved Denies recent fever or chills. Denies sinus pressure, nasal congestion , ear pain or sore throat. Denies chest congestion, or cough productive of sputum. Denies chest pain, palpitations, PND, orthopnea or leg swelling. Denies abdominal pain, nausea, vomitting, diarrhea or constipation. Denies change in bowel movements or bloody stool. Denies dysuria , frequency, incontinence or hesitancy. Denies  joint pain, swelling, or reduced mobility. Denies headaches, vertigo, seizures. Denies depression, anxiety or insomnia. Denies  rash, lesions, or itch.     Current Medications (verified): 1)  Ibuprofen 800 Mg  Tabs (Ibuprofen) .... One Tab By Mouth Two Times A Day As Needed 2)  Fioricet 50-325-40 Mg Tabs (Butalbital-Apap-Caffeine) .... One Tablet Every 6 To 8 Hours As Needed For Severe Headache 3)  Prinzide 20-12.5 Mg Tabs (Lisinopril-Hydrochlorothiazide) .... One and A Half Tablets Daily 4)  Tylenol Pm Extra Strength 500-25 Mg Tabs (Diphenhydramine-Apap (Sleep)) .... Take 1 Tab By Mouth At Bedtime 5)  Pravastatin Sodium 40 Mg Tabs (Pravastatin Sodium) .... Take 1 Tab By Mouth At Bedtime  Allergies (verified): No Known Drug Allergies  Review of Systems      See HPI General:   Complains of fatigue. Eyes:  Denies blurring, discharge, eye irritation, and vision loss-1 eye. Endo:  Denies cold intolerance, excessive hunger, excessive thirst, excessive urination, heat intolerance, polyuria, and weight change. Heme:  Denies abnormal bruising and bleeding. Allergy:  Denies hives or rash and itching eyes.  Physical Exam  General:  Well-developed,well-nourished,in no acute distress; alert,appropriate and cooperative throughout examination HEENT: No facial asymmetry,  EOMI, No sinus tenderness, TM's Clear, oropharynx  pink and moist.   Chest: Clear to auscultation bilaterally.  CVS: S1, S2, No murmurs, No S3.   Abd: Soft, Nontender.  MS: Adequate ROM spine, hips, shoulders and knees.  Ext: No edema.   CNS: CN 2-12 intact, power tone and sensation normal throughout.   Skin: Intact, no visible lesions or rashes.  Psych: Good eye contact, normal affect.  Memory intact, not anxious or depressed appearing.    Impression & Recommendations:  Problem # 1:  IMPAIRED FASTING GLUCOSE (ICD-790.21) Assessment Comment Only  Orders: T- Hemoglobin A1C ([address]), due, low carb diet discussed and encouraged as well as regular exercise  Labs Reviewed: Creat: 0.73 (02/22/2010)     Problem # 2:  HYPERTENSION (ICD-401.9) Assessment: Unchanged  Her updated medication list for this problem includes:    Prinzide 20-12.5 Mg Tabs (Lisinopril-hydrochlorothiazide) ..... One and a half tablets daily  Orders: T-Basic Metabolic Panel [phone])  BP today: 118/80 Prior BP: 112/80 (02/24/2010)  Labs Reviewed: K+: 4.0 (02/22/2010) Creat: : 0.73 (02/22/2010)  Chol: 213 (02/22/2010)   HDL: 37 (02/22/2010)   LDL: 137 (02/22/2010)   TG: 196 (02/22/2010)  Problem # 3:  OVERWEIGHT (ICD-278.02) Assessment: Unchanged  Ht: 66.5 (06/09/2010)   Wt: 184 (06/09/2010)   BMI: 29.36 (06/09/2010)  Problem # 4:  PAIN IN JOINT, MULTIPLE SITES (ICD-719.49) Assessment: Improved  Problem #  5:  HEADACHE (ICD-784.0) Assessment: Improved  Her updated medication list for this problem includes:    Ibuprofen 800 Mg Tabs (Ibuprofen) ..... One tab by mouth two times a day as needed    Fioricet 50-325-40 Mg Tabs (Butalbital-apap-caffeine) ..... One tablet every 6 to 8 hours as needed for severe headache  Problem # 6:  HYPERLIPIDEMIA (ICD-272.4) Assessment: Comment Only  Her updated medication list for this problem includes:    Pravastatin Sodium 40 Mg Tabs (Pravastatin sodium) .Marland Kitchen... Take 1 tab by mouth at bedtime  Orders: T-Hepatic Function [phone]) T-Lipid Profile [phone])  Labs Reviewed: SGOT: 18 (02/24/2010)   SGPT: 13 (02/24/2010)   HDL:37 (02/22/2010), 42 (02/09/2009)  LDL:137 (02/22/2010), 140 (02/09/2009)  Chol:213 (02/22/2010), 218 (02/09/2009)  Trig:196 (02/22/2010), 181 (02/09/2009)  Complete Medication List: 1)  Ibuprofen 800 Mg Tabs (Ibuprofen) .... One tab by mouth two times a day as needed 2)  Fioricet 50-325-40 Mg Tabs (Butalbital-apap-caffeine) .... One tablet every 6 to 8 hours as needed for severe headache 3)  Prinzide 20-12.5 Mg Tabs (Lisinopril-hydrochlorothiazide) .... One and a half tablets daily 4)  Tylenol Pm Extra Strength 500-25 Mg Tabs (Diphenhydramine-apap (sleep)) .... Take 1 tab by mouth at bedtime 5)  Pravastatin Sodium 40 Mg Tabs (Pravastatin sodium) .... Take 1 tab by mouth at bedtime  Patient Instructions: 1)  Please schedule a follow-up appointment in 4 5 months. 2)  It is important that you exercise regularly at least 20 minutes 5 times a week. If you develop chest pain, have severe difficulty breathing, or feel very tired , stop exercising immediately and seek medical attention. 3)  You need to lose weight. Consider a lower calorie diet and regular exercise.  4)  BMP prior to visit, ICD-9: 5)  Hepatic Panel prior to visit, ICD-9: 6)  Lipid Panel prior to visit, ICD-9:  fasting nex t week  7)  HbgA1C prior to visit, ICD-9: 8)   your bp is great

## 2010-12-30 NOTE — Letter (Signed)
Summary: [facility]/ mammogram paper signed  [facility]/ mammogram paper signed   Imported By: Lind Guest 10/14/2010 08:54:30  _____________________________________________________________________  External Attachment:    Type:   Image     Comment:   External Document

## 2010-12-30 NOTE — Progress Notes (Signed)
Summary: lipitor   Phone Note Call from Patient   Summary of Call: patient states that pharmacy wouldn't let her get lipitor with the card Dr. Lodema Hong gave her this morning.  They stated that we would need to call her insurance company and ask them about the card.   Initial call taken by: Curtis Sites,  February 24, 2010 1:24 PM  Follow-up for Phone Call        Talked to her and told her that the lipitor needed a PA and already sent a message to dr. Lodema Hong about changing it Follow-up by: Everitt Amber LPN,  February 25, 2010 4:09 PM

## 2010-12-30 NOTE — Progress Notes (Signed)
Summary: results  Phone Note Call from Patient   Summary of Call: RESULTS OF MAMMOGRAM CALL BACK AT [phone] Initial call taken by: Lind Guest,  October 09, 2010 9:12 AM  Follow-up for Phone Call        find out when and where she had it and send for it , I will let her know next week when the info is available Follow-up by: Syliva Overman MD,  October 10, 2010 12:37 PM  Additional Follow-up for Phone Call Additional follow up Details #1::        Called patient, no answer Additional Follow-up by: Mauricia Area CMA,  October 10, 2010 3:20 PM    Additional Follow-up for Phone Call Additional follow up Details #2::    Patient aware. [facility] Monday, October 06, 2010. Sent for results. Follow-up by: Mauricia Area CMA,  October 10, 2010 3:27 PM

## 2010-12-30 NOTE — Progress Notes (Signed)
Summary: needs time off  Phone Note Call from Patient   Summary of Call: pt daughter was in a bad accident and would like to get a not to take time off work. [phone]  Initial call taken by: Rudene Anda,  Apr 16, 2010 4:30 PM  Follow-up for Phone Call        I could do a note ,but would be better to get FMLA forms from her daughters dr. Follow-up by: Esperanza Sheets PA,  Apr 16, 2010 5:44 PM  Additional Follow-up for Phone Call Additional follow up Details #1::        returned call, left message Additional Follow-up by: Adella Hare LPN,  Apr 17, 2010 3:41 PM    Additional Follow-up for Phone Call Additional follow up Details #2::    states she cant get fmla papers that way because daughter is over 77   only needs work note, she already has fmla paper for herself for her bp Follow-up by: Adella Hare LPN,  Apr 17, 2010 4:02 PM  Additional Follow-up for Phone Call Additional follow up Details #3:: Details for Additional Follow-up Action Taken: Advise pt I spoke with Dr Lodema Hong. We can complete FMLA forms for her to care for a family member.  She would be listed as the employee but her daughter would be listed as the pt.  Or she can have her daughters Drs fill this out for her.  If we fill out the forms, we will need a written statement from her daughters Drs verifying the illness and need for care x 6-8 weeks. Additional Follow-up by: Esperanza Sheets PA,  Apr 18, 2010 8:14 AM  returned call, left message Adella Hare LPN  Apr 18, 2010 9:13 AM  patient aware Adella Hare LPN  Apr 18, 2010 9:15 AM

## 2010-12-30 NOTE — Letter (Signed)
Summary: DIAG. MAMMO REF  DIAG. MAMMO REF   Imported By: Lind Guest 10/13/2010 13:01:05  _____________________________________________________________________  External Attachment:    Type:   Image     Comment:   External Document

## 2010-12-30 NOTE — Progress Notes (Signed)
Summary: cough medicine  Phone Note Call from Patient   Summary of Call: was in here this week and still has the cough and would like to know will you call in some tussionex  call back at [phone] Initial call taken by: Lind Guest,  October 16, 2010 11:18 AM  Follow-up for Phone Call        script printed pls fax and let her know Follow-up by: Syliva Overman MD,  October 16, 2010 3:47 PM  Additional Follow-up for Phone Call Additional follow up Details #1::        med sent, called patient, no answer Additional Follow-up by: Adella Hare LPN,  October 16, 2010 4:35 PM    New/Updated Medications: Sandria Senter ER 10-8 MG/5ML LQCR (HYDROCOD POLST-CHLORPHEN POLST) one teaspoon twice daily as needed for uncontrolled cough Prescriptions: TUSSIONEX PENNKINETIC ER 10-8 MG/5ML LQCR (HYDROCOD POLST-CHLORPHEN POLST) one teaspoon twice daily as needed for uncontrolled cough  #300cc x 0   Entered and Authorized by:   Syliva Overman MD   Signed by:   Syliva Overman MD on 10/16/2010   Method used:   Printed then faxed to ...       [facility] (retail)       [address]       [place], [place]  [address]       Ph: [phone]       Fax: [phone]   RxID:   [id number]

## 2010-12-31 ENCOUNTER — Encounter: Payer: Self-pay | Admitting: Family Medicine

## 2011-01-07 NOTE — Letter (Signed)
Summary: 1st missed letter  1st missed letter   Imported By: Lind Guest 12/31/2010 09:28:20  _____________________________________________________________________  External Attachment:    Type:   Image     Comment:   External Document

## 2011-01-07 NOTE — Progress Notes (Signed)
Summary: MEDICINE  Phone Note Call from Patient   Summary of Call: LEFT MESSAGE THAT SHE NEEDS SOMETHING FOR HER BRONCHITIS. [facility] AND COUGH MEDICINE  CALL BACK AT [phone] Initial call taken by: Lind Guest,  December 29, 2010 8:23 AM  Follow-up for Phone Call        if she has fever and green sputum she needs an office visit, if not advise robitussin or mucinex D with fluids  Follow-up by: Syliva Overman MD,  December 29, 2010 12:31 PM  Additional Follow-up for Phone Call Additional follow up Details #1::        she left message at lunch to call her Additional Follow-up by: Lind Guest,  December 29, 2010 12:46 PM    Additional Follow-up for Phone Call Additional follow up Details #2::    patient declines fever or green drainage but states she has been taking otc meds and been sick all weekend advised to schedule ov Follow-up by: Adella Hare LPN,  December 29, 2010 1:52 PM

## 2011-02-12 ENCOUNTER — Telehealth: Payer: Self-pay | Admitting: Family Medicine

## 2011-02-16 ENCOUNTER — Ambulatory Visit (INDEPENDENT_AMBULATORY_CARE_PROVIDER_SITE_OTHER): Payer: BC Managed Care – PPO | Admitting: Family Medicine

## 2011-02-16 ENCOUNTER — Other Ambulatory Visit (HOSPITAL_COMMUNITY)
Admission: RE | Admit: 2011-02-16 | Discharge: 2011-02-16 | Disposition: A | Discharge status: Home / Self Care | Payer: BC Managed Care – PPO | Source: Ambulatory Visit | Attending: Family Medicine | Admitting: Family Medicine

## 2011-02-16 ENCOUNTER — Encounter: Payer: Self-pay | Admitting: Family Medicine

## 2011-02-16 ENCOUNTER — Other Ambulatory Visit: Payer: Self-pay | Admitting: Family Medicine

## 2011-02-16 DIAGNOSIS — Z1211 Encounter for screening for malignant neoplasm of colon: Secondary | ICD-10-CM

## 2011-02-16 DIAGNOSIS — Z0000 Encounter for general adult medical examination without abnormal findings: Secondary | ICD-10-CM

## 2011-02-16 DIAGNOSIS — Z124 Encounter for screening for malignant neoplasm of cervix: Secondary | ICD-10-CM

## 2011-02-16 DIAGNOSIS — I10 Essential (primary) hypertension: Secondary | ICD-10-CM

## 2011-02-16 DIAGNOSIS — Z01419 Encounter for gynecological examination (general) (routine) without abnormal findings: Secondary | ICD-10-CM | POA: Insufficient documentation

## 2011-02-16 LAB — CONVERTED CEMR LAB
Bilirubin Urine: NEGATIVE
Nitrite: NEGATIVE
OCCULT 1: NEGATIVE
Specific Gravity, Urine: >=1.03
Urobilinogen, UA: 0.2

## 2011-02-17 ENCOUNTER — Encounter: Payer: Self-pay | Admitting: Family Medicine

## 2011-02-17 NOTE — Progress Notes (Signed)
Summary: lab order  Phone Note Call from Patient   Summary of Call: send lab order to lab going saturday to the lab. Has an appoinment Monday Initial call taken by: Lind Guest,  February 12, 2011 12:55 PM  Follow-up for Phone Call        what needs to be ordered Follow-up by: Adella Hare LPN,  February 12, 2011 1:33 PM  Additional Follow-up for Phone Call Additional follow up Details #1::        cbc, chem 7 lipid, hepatic, TSh, and HBA1C and vit D level Additional Follow-up by: Syliva Overman MD,  February 12, 2011 2:34 PM    Additional Follow-up for Phone Call Additional follow up Details #2::    order sent Follow-up by: Adella Hare LPN,  February 13, 2011 8:37 AM

## 2011-02-19 ENCOUNTER — Encounter: Payer: Self-pay | Admitting: *Deleted

## 2011-02-24 ENCOUNTER — Telehealth: Payer: Self-pay | Admitting: Family Medicine

## 2011-02-24 LAB — CONVERTED CEMR LAB
ALT: 17 units/L (ref 0–35)
AST: 16 units/L (ref 0–37)
Albumin: 4.5 g/dL (ref 3.5–5.2)
Basophils Absolute: 0 10*3/uL (ref 0.0–0.1)
Calcium: 9.7 mg/dL (ref 8.4–10.5)
Cholesterol: 177 mg/dL (ref 0–200)
Eosinophils Absolute: 0.2 10*3/uL (ref 0.0–0.7)
Eosinophils Relative: 3 % (ref 0–5)
HCT: 39 % (ref 36.0–46.0)
HDL: 48 mg/dL (ref >39)
MCV: 98.5 fL (ref 78.0–100.0)
Neutrophils Relative %: 59 % (ref 43–77)
Platelets: 372 10*3/uL (ref 150–400)
RDW: 14.3 % (ref 11.5–15.5)
Sodium: 140 meq/L (ref 135–145)
TSH: 1.05 microintl units/mL (ref 0.350–4.500)
Total CHOL/HDL Ratio: 3.7
Total Protein: 7.3 g/dL (ref 6.0–8.3)
Triglycerides: 140 mg/dL (ref <150)

## 2011-02-24 NOTE — Telephone Encounter (Signed)
Patient aware of lab results.

## 2011-02-26 NOTE — Assessment & Plan Note (Signed)
Summary: f up   Vital Signs:  Patient profile:   45 year old female Menstrual status:  hysterectomy Height:      66.5 inches Weight:      187.31 pounds BMI:     29.89 O2 Sat:      97 % Pulse rate:   85 / minute Pulse rhythm:   regular Resp:     16 per minute BP supine:   130 / 86  (right arm) BP sitting:   130 / 90  (left arm) Cuff size:   large  Vitals Entered By: Everitt Amber LPN (February 16, 2011 8:03 AM)  Nutrition Counseling: Patient's BMI is greater than 25 and therefore counseled on weight management options. CC: CPE  Vision Screening:Left eye w/o correction: 20 / 40 Right Eye w/o correction: 20 / 40 Both eyes w/o correction:  20/ 25        Vision Entered By: Adella Hare LPN (February 16, 2011 9:30 AM)   Primary Care Provider:  Syliva Overman MD  CC:  CPE.  History of Present Illness: Reports  that she has been doing well. Denies recent fever or chills. Denies sinus pressure, nasal congestion , ear pain or sore throat. Denies chest congestion, or cough productive of sputum. Denies chest pain, palpitations, PND, orthopnea or leg swelling. Denies abdominal pain, nausea, vomitting, diarrhea or constipation. Denies change in bowel movements or bloody stool. Denies dysuria , frequency, incontinence or hesitancy. Denies  joint pain, swelling, or reduced mobility. Denies , vertigo, seizures. Denies depression, anxiety or insomnia. Denies  rash, lesions, or itch.     Current Medications (verified): 1)  Ibuprofen 800 Mg  Tabs (Ibuprofen) .... One Tab By Mouth Two Times A Day As Needed 2)  Fioricet 50-325-40 Mg Tabs (Butalbital-Apap-Caffeine) .... One Tablet Every 6 To 8 Hours As Needed For Severe Headache 3)  Prinzide 20-12.5 Mg Tabs (Lisinopril-Hydrochlorothiazide) .... One and A Half Tablets Daily 4)  Tylenol Pm Extra Strength 500-25 Mg Tabs (Diphenhydramine-Apap (Sleep)) .... Take 1 Tab By Mouth At Bedtime 5)  Pravastatin Sodium 40 Mg Tabs (Pravastatin Sodium)  .... Two At Bedtime 6)  Vitamin D 1000 Unit Tabs (Cholecalciferol) .... Take 1 Tablet By Mouth Once A Day 7)  Biotin 300 Mcg Tabs (Biotin) .... 2 Tabs A Day 8)  Benadryl 25 Mg Caps (Diphenhydramine Hcl) .... 2 At Bedtime 9)  Colace 100 Mg Caps (Docusate Sodium) .... Take 1 Tablet By Mouth Two Times A Day  Allergies (verified): No Known Drug Allergies  Review of Systems      See HPI General:  Denies fatigue. Eyes:  Complains of vision loss-both eyes. GU:  Complains of urinary frequency; denies dysuria; pt reports dark colored urine wants it checked. Neuro:  Complains of headaches; occasional. Psych:  Denies anxiety and depression. Endo:  Denies cold intolerance, excessive hunger, excessive thirst, excessive urination, and heat intolerance. Heme:  Denies abnormal bruising, bleeding, enlarge lymph nodes, and fevers. Allergy:  Complains of seasonal allergies; denies hives or rash and itching eyes.  Physical Exam  General:  Well-developed,well-nourished,in no acute distress; alert,appropriate and cooperative throughout examination Head:  Normocephalic and atraumatic without obvious abnormalities. No apparent alopecia or balding. Eyes:  No corneal or conjunctival inflammation noted. EOMI. Perrla. Funduscopic exam benign, without hemorrhages, exudates or papilledema. Vision grossly normal. Ears:  External ear exam shows no significant lesions or deformities.  Otoscopic examination reveals clear canals, tympanic membranes are intact bilaterally without bulging, retraction, inflammation or discharge. Hearing is grossly normal  bilaterally. Nose:  External nasal examination shows no deformity or inflammation. Nasal mucosa are pink and moist without lesions or exudates. Mouth:  Oral mucosa and oropharynx without lesions or exudates.  Teeth in good repair. Neck:  No deformities, masses, or tenderness noted. Chest Wall:  No deformities, masses, or tenderness noted. Breasts:  No mass, nodules,  thickening, tenderness, bulging, retraction, inflamation, nipple discharge or skin changes noted.   Lungs:  Normal respiratory effort, chest expands symmetrically. Lungs are clear to auscultation, no crackles or wheezes. Heart:  Normal rate and regular rhythm. S1 and S2 normal without gallop, murmur, click, rub or other extra sounds. Abdomen:  Bowel sounds positive,abdomen soft and non-tender without masses, organomegaly or hernias noted. Rectal:  No external abnormalities noted. Normal sphincter tone. No rectal masses or tenderness. Genitalia:  Normal introitus for age, no external lesions, no vaginal discharge, mucosa pink and moist, no vaginal or cervical lesions, no vaginal atrophy, no friaility or hemorrhage, uterus abnsent, no adnexal masses or tenderness Msk:  No deformity or scoliosis noted of thoracic or lumbar spine.   Pulses:  R and L carotid,radial,femoral,dorsalis pedis and posterior tibial pulses are full and equal bilaterally Extremities:  No clubbing, cyanosis, edema, or deformity noted with normal full range of motion of all joints.   Neurologic:  No cranial nerve deficits noted. Station and gait are normal. Plantar reflexes are down-going bilaterally. DTRs are symmetrical throughout. Sensory, motor and coordinative functions appear intact. Skin:  Intact without suspicious lesions or rashes Cervical Nodes:  No lymphadenopathy noted Axillary Nodes:  No palpable lymphadenopathy Inguinal Nodes:  No significant adenopathy Psych:  Cognition and judgment appear intact. Alert and cooperative with normal attention span and concentration. No apparent delusions, illusions, hallucinations   Impression & Recommendations:  Problem # 1:  PHYSICAL EXAMINATION (ICD-V70.0) Assessment Comment Only  Problem # 2:  HYPERTENSION (ICD-401.9) Assessment: Deteriorated  Her updated medication list for this problem includes:    Prinzide 20-12.5 Mg Tabs (Lisinopril-hydrochlorothiazide) ..... One and a  half tablets daily  Orders: UA Dipstick W/ Micro (manual) (40981) T-Basic Metabolic Panel (19147-82956)  BP today: 130/90 Prior BP: 130/84 (10/13/2010)  Labs Reviewed: K+: 3.7 (10/11/2010) Creat: : 0.73 (10/11/2010)   Chol: 205 (10/11/2010)   HDL: 53 (10/11/2010)   LDL: 123 (10/11/2010)   TG: 147 (10/11/2010)  Problem # 3:  HYPERLIPIDEMIA (ICD-272.4) Assessment: Deteriorated  Her updated medication list for this problem includes:    Pravastatin Sodium 40 Mg Tabs (Pravastatin sodium) .Marland Kitchen..Marland Kitchen Two at bedtime  Orders: T-Lipid Profile [phone]) T-Hepatic Function [phone])  Labs Reviewed: SGOT: 16 (10/11/2010)   SGPT: 13 (10/11/2010)   HDL:53 (10/11/2010), 50 (06/14/2010)  LDL:123 (10/11/2010), 120 (06/14/2010)  Chol:205 (10/11/2010), 212 (06/14/2010)  Trig:147 (10/11/2010), 210 (06/14/2010)  Problem # 4:  DEPRESSION (ICD-311)  Complete Medication List: 1)  Ibuprofen 800 Mg Tabs (Ibuprofen) .... One tab by mouth two times a day as needed 2)  Fioricet 50-325-40 Mg Tabs (Butalbital-apap-caffeine) .... One tablet every 6 to 8 hours as needed for severe headache 3)  Prinzide 20-12.5 Mg Tabs (Lisinopril-hydrochlorothiazide) .... One and a half tablets daily 4)  Tylenol Pm Extra Strength 500-25 Mg Tabs (Diphenhydramine-apap (sleep)) .... Take 1 tab by mouth at bedtime 5)  Pravastatin Sodium 40 Mg Tabs (Pravastatin sodium) .... Two at bedtime 6)  Vitamin D 1000 Unit Tabs (Cholecalciferol) .... Take 1 tablet by mouth once a day 7)  Biotin 300 Mcg Tabs (Biotin) .... 2 tabs a day 8)  Benadryl 25 Mg Caps (Diphenhydramine  hcl) .... 2 at bedtime 9)  Colace 100 Mg Caps (Docusate sodium) .... Take 1 tablet by mouth two times a day  Other Orders: Hemoccult Guaiac-1 spec.(in office) (82270) Pap Smear (82956)  Patient Instructions: 1)  Please schedule a follow-up appointment in 6 months. 2)  It is important that you exercise regularly at least 20 minutes 5 times a week. If you develop  chest pain, have severe difficulty breathing, or feel very tired , stop exercising immediately and seek medical attention. 3)  You need to lose weight. Consider a lower calorie diet and regular exercise. Goal is 5 to 8 pounds 4)  Your blood pressure is not as controlled as I would like,PLS cut back on salt, and increase fresh fruit and veg intake, also make a serious attempt to lose weight . 5)  No med changes at this time, labs are good overall, and improved. 6)  Pls call for an eye exam Prescriptions: PRAVASTATIN SODIUM 40 MG TABS (PRAVASTATIN SODIUM) two at bedtime  #180 x 1   Entered by:   Adella Hare LPN   Authorized by:   Syliva Overman MD   Signed by:   Adella Hare LPN on 21/30/8657   Method used:   Electronically to        [facility] (retail)       [address]       [place], [place]  [address]       Ph: [phone]       Fax: [phone]   RxID:   [id number] PRINZIDE 20-12.5 MG TABS (LISINOPRIL-HYDROCHLOROTHIAZIDE) one and a half tablets daily  #45 x 5   Entered by:   Adella Hare LPN   Authorized by:   Syliva Overman MD   Signed by:   Adella Hare LPN on 56/43/3295   Method used:   Electronically to        [facility] (retail)       [address]       [place], [place]  [address]       Ph: [phone]       Fax: [phone]   RxID:   [id number] FIORICET 50-325-40 MG TABS [facility]) one tablet every 6 to 8 hours as needed for severe headache  #30 x 5   Entered by:   Adella Hare LPN   Authorized by:   Syliva Overman MD   Signed by:   Adella Hare LPN on 15/17/6160   Method used:   Electronically to        [facility] (retail)       [address]       [place], [place]  [address]       Ph: [phone]       Fax: [phone]   RxID:   [id number] IBUPROFEN 800 MG  TABS (IBUPROFEN) one tab by mouth two times a day as needed  #60 x 5    Entered by:   Adella Hare LPN   Authorized by:   Syliva Overman MD   Signed by:   Adella Hare LPN on 01/75/1025   Method used:   Electronically to        [facility] (retail)       [address] [facility]  [place], [place]  [address]       Ph: [phone]       Fax: [phone]   RxID:   [id number]    Orders Added: 1)  UA Dipstick W/ Micro (manual) [81000] 2)  Est. Patient 40-64 years [99396] 3)  T-Basic Metabolic Panel [80048-22910] 4)  T-Lipid Profile [80061-22930] 5)  T-Hepatic Function [80076-22960] 6)  Hemoccult Guaiac-1 spec.(in office) [82270] 7)  Pap Smear [88150]    Laboratory Results   Urine Tests    Routine Urinalysis   Color: lt. yellow Appearance: Clear Glucose: negative   (Normal Range: Negative) Bilirubin: negative   (Normal Range: Negative) Ketone: negative   (Normal Range: Negative) Spec. Gravity: >=1.030   (Normal Range: 1.003-1.035) Blood: negative   (Normal Range: Negative) pH: 5.5   (Normal Range: 5.0-8.0) Protein: negative   (Normal Range: Negative) Urobilinogen: 0.2   (Normal Range: 0-1) Nitrite: negative   (Normal Range: Negative) Leukocyte Esterace: negative   (Normal Range: Negative)    Date/Time Received: February 16, 2011 9:29 AM  Date/Time Reported: February 16, 2011 9:29 AM   Stool - Occult Blood Hemmoccult #1: negative Date: 02/16/2011 Comments: 5030 5/14 51301 13L 10/13 Adella Hare LPN  February 16, 2011 9:30 AM    Laboratory Results   Urine Tests    Routine Urinalysis   Color: lt. yellow Appearance: Clear Glucose: negative   (Normal Range: Negative) Bilirubin: negative   (Normal Range: Negative) Ketone: negative   (Normal Range: Negative) Spec. Gravity: >=1.030   (Normal Range: 1.003-1.035) Blood: negative   (Normal Range: Negative) pH: 5.5   (Normal Range: 5.0-8.0) Protein: negative   (Normal Range: Negative) Urobilinogen: 0.2   (Normal Range: 0-1) Nitrite: negative   (Normal  Range: Negative) Leukocyte Esterace: negative   (Normal Range: Negative)     Comments: 5030 5/14 51301 13L 10/13 Adella Hare LPN  February 16, 2011 9:30 AM

## 2011-03-23 ENCOUNTER — Ambulatory Visit (INDEPENDENT_AMBULATORY_CARE_PROVIDER_SITE_OTHER): Payer: BC Managed Care – PPO | Admitting: Family Medicine

## 2011-03-23 ENCOUNTER — Encounter: Payer: Self-pay | Admitting: Family Medicine

## 2011-03-23 DIAGNOSIS — R7301 Impaired fasting glucose: Secondary | ICD-10-CM

## 2011-03-23 DIAGNOSIS — E785 Hyperlipidemia, unspecified: Secondary | ICD-10-CM

## 2011-03-23 DIAGNOSIS — I10 Essential (primary) hypertension: Secondary | ICD-10-CM

## 2011-03-23 DIAGNOSIS — N3090 Cystitis, unspecified without hematuria: Secondary | ICD-10-CM

## 2011-03-23 DIAGNOSIS — R300 Dysuria: Secondary | ICD-10-CM

## 2011-03-23 LAB — POCT URINALYSIS DIPSTICK
Protein, UA: NEGATIVE
Spec Grav, UA: 1.03
pH, UA: 5.5

## 2011-03-23 MED ORDER — CIPROFLOXACIN HCL 500 MG PO TABS: 500.0000 mg | ORAL_TABLET | Freq: Two times a day (BID) | ORAL | Status: AC

## 2011-03-23 MED ORDER — FLUCONAZOLE 150 MG PO TABS: 150.0000 mg | ORAL_TABLET | Freq: Once | ORAL | Status: AC

## 2011-03-23 NOTE — Patient Instructions (Signed)
F/u in 4 months.  Fasting lipid, hepatic and chem7 and hBA1C in 4 months.  Med is sent in today for a presumed UTI. Drink at least 64 ounces of water daily.  No changes in medications today.  It is important that you exercise regularly at least 30 minutes 5 times a week. If you develop chest pain, have severe difficulty breathing, or feel very tired, stop exercising immediately and seek medical attention  A healthy diet is rich in fruit, vegetables and whole grains. Poultry fish, nuts and beans are a healthy choice for protein rather then red meat. A low sodium diet and drinking 64 ounces of water daily is generally recommended. Oils and sweet should be limited. Carbohydrates especially for those who are diabetic or overweight, should be limited to 34-45 gram per meal. It is important to eat on a regular schedule, at least 3 times daily. Snacks should be primarily fruits, vegetables or nuts.

## 2011-03-25 LAB — URINE CULTURE

## 2011-03-25 NOTE — Progress Notes (Signed)
  Subjective:    Patient ID: Meredith Sanchez, female    DOB: 03-21-1966, 45 y.o.   MRN: [id number]  HPI 1 week h/o dysuria and frequency, denies flank pain , fever, chills , nausea or vomiting. Prior to this she has been well and is here for f/u of her chronic problems   Review of Systems Denies recent fever or chills. Denies sinus pressure, nasal congestion, ear pain or sore throat. Denies chest congestion, productive cough or wheezing. Denies chest pains, palpitations, paroxysmal nocturnal dyspnea, orthopnea and leg swelling Denies abdominal pain, nausea, vomiting,diarrhea or constipation.  Denies rectal bleeding or change in bowel movement.  Denies joint pain, swelling and limitation in mobility. Denies headaches, seizure, numbness, or tingling. Denies depression, anxiety or insomnia. Denies skin break down or rash.        Objective:   Physical Exam Patient alert and oriented and in no Cardiopulmonary distress.  HEENT: No facial asymmetry, EOMI, no sinus tenderness, TM's clear, Oropharynx pink and moist.  Neck supple no adenopathy.  Chest: Clear to auscultation bilaterally.  CVS: S1, S2 no murmurs, no S3.  ABD: Soft mild suprapubic tenderness, no renal angle tenderness. Bowel sounds normal.  Ext: No edema  MS: Adequate ROM spine, shoulders, hips and knees.  Skin: Intact, no ulcerations or rash noted.  Psych: Good eye contact, normal affect. Memory intact not anxious or depressed appearing.  CNS: CN 2-12 intact, power, tone and sensation normal throughout.        Assessment & Plan:  1. Cystitis: cipro prescribed and urine c/s sent 2.Hypertension:Controlled, no changes in medication.  3.Hyperlipidemia: Hyperlipidemia:Low fat diet discussed and encouraged. Pt to have fasting labs 4. Obesity: unchanged, lifestyle change encouraged and discussed

## 2011-04-17 NOTE — Op Note (Signed)
[facility]  Patient:    Meredith Sanchez, Meredith Sanchez Visit Number: [id number] MRN: [id number]          Service Type: Attending:  Arna Snipe, M.D. Dictated by:   Arna Snipe, M.D. Proc. Date: 04/05/02                             Operative Report  PREOPERATIVE DIAGNOSES: 1. Fibroid uterus. 2. Menometrorrhagia.  POSTOPERATIVE DIAGNOSES: 1. Fibroid uterus. 2. Menometrorrhagia.  OPERATION PERFORMED: 1. Total abdominal hysterectomy. 2. Left salpingo-oophorectomy. 3. Incidental appendectomy.  COMPLICATIONS:  None.  SURGEON:  Arna Snipe, M.D.  DESCRIPTION OF PROCEDURE:  Under adequate general anesthesia, the patient was prepped with Betadine in the usual manner.  A transverse Pfannenstiel skin incision was made.  This was carried through the subcutaneous tissue and the surface of the anterior rectus sheath which was incised longitudinally and the rectus muscles split in the midline.  The posterior rectus sheath and peritoneum were incised and there was noted to be a huge fibroid uterus extended up to the level of the umbilicus.  It contained multiple fibroids. The left ovary was noted to be plastered to the uterus and it was difficult to remove.  The right ovary was normal.  A corkscrew apparatus was inserted into the uterus and with moderate difficulty the uterus was introduced into the wound.  The mesosalpinx on the right was clamped, divided and suture ligated with #1 chromic.  The round ligament on the right was clamped, divided and suture ligated with #1 chromic.  The round ligament on the left was clamped, divided and suture ligated with #1 chromic.  The infundibulopelvic ligament on the left was clamped, divided and suture ligated with #1 chromic, thus sacrificing the left ovary.  The bladder was pushed inferiorly out of harms way and the uterine vessels were clamped, divided and suture ligated with #1 chromic.  Descending branch was clamped,  divided and suture ligated with #1 chromic.  Then with upward traction on the body of the uterus, utilizing an electrocautery, the uterus, body and cervix were removed in toto.  The vaginal cuff was closed with continuous locking #1 chromic and the entire operative area then reperitonealized with continuous 0 chromic.  We did note that there was decreased drainage through the Foley catheter and the bladder appeared to be filling up.  It was moved several times and the urine flow increased.  Attention was then turned to the cecum.  There appeared to be a long retrocecal appendix.  This was mobilized and mesoappendix clamped, divided and suture ligated with 0 chromic catgut, the appendix ligated at its base, amputated, electrofulgurated and buried the mesoappendix.  After the terminal ileum was under control and all packing removed, the pelvis itself was packed with two large Gelfoam sponges and the wound closed in layers.  The peritoneum and posterior rectus sheath with continuous 2-0 Novofil, the rectus muscle and anterior rectus sheath with continuous 2-0 Novofil, the subcutaneous tissue closed with continuous 3-0 Vicryl. The skin was closed with subcuticular suture of 3-0 Vicryl.  Steri-Strips, Telfa and Op-Site dressing applied.  After the operation was completed, we removed the Foley catheter, inserted a new one and there was fairly good flow.  This was insufficient for the amount of fluid she received.  Urine was clear.  The patient tolerated the procedure and left the operating room in good condition.  She had an estimated blood loss of about 750  cc, 500 of which packed red blood cells were replaced during the procedure. Dictated by:   Arna Snipe, M.D. Attending:  Arna Snipe, M.D. DD:  04/05/02 TD:  04/06/02 Job: [id number]

## 2011-04-17 NOTE — H&P (Signed)
[facility]  Patient:    Meredith Sanchez, Meredith Sanchez Visit Number: [id number] MRN: [id number]          Service Type: Attending:  Arna Snipe, M.D. Dictated by:   Arna Snipe, M.D. Adm. Date:  04/05/02                           History and Physical  CHIEF COMPLAINT:  This is a 45 year old black female who was admitted to this hospital for a total abdominal hysterectomy.  HISTORY OF PRESENT ILLNESS:  The patient has had fibroids for many years. They have gradually increased in size.  She is a gravida 4, para 3, AB 1.  She claims now that she has regular periods, but they last at least two weeks. She complains also of associated pressure over the bladder with frequency and dribbling.  Her symptoms have increased in severity.  I have talked at length to the patient about this.  In view of the fact that she has had a bilateral tubal ligation and the fibroids are at least at or above the level of the umbilicus, surgery probably is the only method of correction.  I did discuss embolization and other methods, but she refused to follow this line of thinking.  PAST MEDICAL HISTORY:  She has had a tubal ligation in 1990.  No serious illnesses.  ALLERGIES:  No history of any food or drug allergies.  PHYSICAL EXAMINATION:  GENERAL:  Reveals a healthy 45 year old black female in no acute distress.  VITAL SIGNS:  Her pressure is 136/78, pulse 80, respirations 20.  HEENT:  Normal.  No jaundice.  NECK:  Supple.  Thyroid not enlarged.  No palpable cervical adenopathy.  CARDIOVASCULAR:  Regular sinus rhythm with no thrills or murmur.  RESPIRATORY:  Chest clear to percussion and auscultation.  BREASTS:  Moderate size, nipples symmetric with no abnormal masses. Examination of both axillae are normal.  ABDOMEN:  Soft, no areas of muscle guarding.  There is some tenderness at the area of the umbilicus.  There is a large palpable mass that seems to extend up out of  the pelvis.  There is normal peristalsis.  No operative scars.  EXTREMITIES:  Limbs are negative.  BACK:  Negative.  PELVIC:  There is a marital introitus.  No pathology of Bartholin or Skene glands.  Recent Pap smear was normal.  The cervix points posteriorly.  It is distorted because of this large anterior fibroid that now extends up to the level of the umbilicus.  There is some pain on motion.  Examination of both adnexa are normal.  Rectovaginal examination normal.  ADMITTING DIAGNOSIS:  Multiple leiomyomata.  DISPOSITION:  The patient is admitted for a total abdominal hysterectomy.  The surgery, risks, and complications, and consequences have been discussed with the patient.  She agrees to the surgery.  She has been scheduled for Apr 05, 2002. Dictated by:   Arna Snipe, M.D. Attending:  Arna Snipe, M.D. DD:  04/04/02 TD:  04/04/02 Job: [id number]

## 2011-04-17 NOTE — Discharge Summary (Signed)
[facility]  Patient:    Meredith Sanchez, SWARTZLANDER Visit Number: [id number] MRN: [id number]          Service Type: SUR Location: 3A A306 01 Attending Physician:  Arna Snipe Dictated by:   Arna Snipe, M.D. Admit Date:  04/05/2002 Discharge Date: 04/07/2002                             Discharge Summary  ADMITTING DIAGNOSIS:  Fibroid uterus.  DISCHARGE DIAGNOSES:  Fibroid uterus.  OPERATIONS PERFORMED:  Apr 05, 2002 total abdominal hysterectomy, left salpingo-oophorectomy, and appendectomy.  COMPLICATIONS:  None.  PROGNOSIS:  Good. The patient recovered.  HISTORY:  This 45 year old black female was admitted to the hospital on Apr 05, 2002 with a huge multilobulated uterus. It extended to and above the umbilicus. She gave a long history of pelvic pain and menometrorrhagia. Symptoms have become progressively increased. She practically had a period all the time. Ultrasound of the pelvis and transvaginal ultrasound preoperatively confirmed the presence of this large uterus.  HOSPITAL COURSE AND TREATMENT:  On the 7th of May, the patient was taken to the operating room and total abdominal hysterectomy and salpingo-oophorectomy and incidental appendectomy was carried out without difficulty. Postoperatively, she did quite nicely. The estimated blood loss was 750 cc. She received a unit of packed red blood cells during the course of the surgery. Postop hemoglobin was 9. Gave her a second unit of blood in the afternoon of the 7th of May. By the 8th of May, she was up doing well, Foley catheter was removed. Hemoglobin by this time was 10.1. By the 9th of May, she had much less pain. She was having normal bowel movements. Her IV was discontinued, after care outlined, and she was discharged on home on this date to be seen in my office in one week. Dictated by:   Arna Snipe, M.D. Attending Physician:  Arna Snipe DD:  04/27/02 TD:  04/28/02 Job:  [id number]

## 2011-05-26 ENCOUNTER — Telehealth: Payer: Self-pay | Admitting: Family Medicine

## 2011-05-26 NOTE — Telephone Encounter (Signed)
Same problem, patient will drop off paper

## 2011-05-26 NOTE — Telephone Encounter (Signed)
If it is the same old problem , she can leave off the papers and collect them for the fee, pls call pt and see if the reason is unchanged from before, nothing new, then she can do this without being seen, pls let her know

## 2011-05-26 NOTE — Telephone Encounter (Signed)
Called patient, left message.

## 2011-08-20 ENCOUNTER — Encounter: Payer: Self-pay | Admitting: Family Medicine

## 2011-08-21 ENCOUNTER — Telehealth: Payer: Self-pay | Admitting: Family Medicine

## 2011-08-21 NOTE — Telephone Encounter (Signed)
Faxed labs to [facility] as requested

## 2011-08-22 LAB — BASIC METABOLIC PANEL
BUN: 14 mg/dL (ref 6–23)
CO2: 27 mEq/L (ref 19–32)
Glucose, Bld: 98 mg/dL (ref 70–99)
Potassium: 3.4 mEq/L — ABNORMAL LOW (ref 3.5–5.3)
Sodium: 139 mEq/L (ref 135–145)

## 2011-08-22 LAB — HEPATIC FUNCTION PANEL
ALT: 23 U/L (ref 0–35)
AST: 29 U/L (ref 0–37)
Bilirubin, Direct: 0.1 mg/dL (ref 0.0–0.3)
Indirect Bilirubin: 0.4 mg/dL (ref 0.0–0.9)
Total Bilirubin: 0.5 mg/dL (ref 0.3–1.2)

## 2011-08-22 LAB — LIPID PANEL
Cholesterol: 156 mg/dL (ref 0–200)
Total CHOL/HDL Ratio: 3.4 Ratio

## 2011-08-24 ENCOUNTER — Ambulatory Visit (INDEPENDENT_AMBULATORY_CARE_PROVIDER_SITE_OTHER): Payer: BC Managed Care – PPO | Admitting: Family Medicine

## 2011-08-24 ENCOUNTER — Encounter: Payer: Self-pay | Admitting: Family Medicine

## 2011-08-24 DIAGNOSIS — R7301 Impaired fasting glucose: Secondary | ICD-10-CM

## 2011-08-24 DIAGNOSIS — E663 Overweight: Secondary | ICD-10-CM

## 2011-08-24 DIAGNOSIS — I10 Essential (primary) hypertension: Secondary | ICD-10-CM

## 2011-08-24 DIAGNOSIS — E785 Hyperlipidemia, unspecified: Secondary | ICD-10-CM

## 2011-08-24 DIAGNOSIS — Z23 Encounter for immunization: Secondary | ICD-10-CM

## 2011-08-24 MED ORDER — INFLUENZA VAC TYPES A & B PF IM SUSP: 0.5000 mL | Freq: Once | INTRAMUSCULAR | Status: DC

## 2011-08-24 MED ORDER — LISINOPRIL-HYDROCHLOROTHIAZIDE 20-12.5 MG PO TABS: 1.0000 | ORAL_TABLET | Freq: Every day | ORAL | Status: DC

## 2011-08-24 MED ORDER — PRAVASTATIN SODIUM 40 MG PO TABS: 40.0000 mg | ORAL_TABLET | Freq: Every day | ORAL | Status: DC

## 2011-08-24 NOTE — Patient Instructions (Addendum)
CPE March 20 or after  LABWORK  NEEDS TO BE DONE BETWEEN 3 TO 7 DAYS BEFORE YOUR NEXT SCEDULED  VISIT.  THIS WILL IMPROVE THE QUALITY OF YOUR CARE.  fasting chem 7, lipid, hepatic, HBa1C in 6 months  It is important that you exercise regularly at least 30 minutes 5 times a week. If you develop chest pain, have severe difficulty breathing, or feel very tired, stop exercising immediately and seek medical attention    A healthy diet is rich in fruit, vegetables and whole grains. Poultry fish, nuts and beans are a healthy choice for protein rather then red meat. A low sodium diet and drinking 64 ounces of water daily is generally recommended. Oils and sweet should be limited. Carbohydrates especially for those who are diabetic or overweight, should be limited to 30-45 gram per meal. It is important to eat on a regular schedule, at least 3 times daily. Snacks should be primarily fruits, vegetables or nuts.  Weight loss goal of 2 ponds per month  No med change changes at this time  Pls schedule mammogram when it is due  Flu vac today

## 2011-08-24 NOTE — Assessment & Plan Note (Signed)
Controlled, no change in medication Low fat diet discussed and encouraged. Follow up lipid profile  in 3 to 6 months  

## 2011-08-24 NOTE — Assessment & Plan Note (Signed)
Deteriorated. Patient re-educated about  the importance of commitment to a  minimum of 150 minutes of exercise per week. The importance of healthy food choices with portion control discussed. Encouraged to start a food diary, count calories and to consider  joining a support group. Sample diet sheets offered. Goals set by the patient for the next several months.    

## 2011-08-24 NOTE — Progress Notes (Signed)
  Subjective:    Patient ID: Meredith Sanchez, female    DOB: 1966-09-16, 45 y.o.   MRN: [id number]  HPI The PT is here for follow up and re-evaluation of chronic medical conditions, medication management and review of any available recent lab and radiology data.  Preventive health is updated, specifically  Cancer screening and Immunization.   Questions or concerns regarding consultations or procedures which the PT has had in the interim are  addressed. The PT denies any adverse reactions to current medications since the last visit.  Concerned about weight gain, has not been exercising and is eating increased bread and candy. Not exercising [place] avg twice per month, depending on stress     Review of Systems Denies recent fever or chills. Denies sinus pressure, nasal congestion, ear pain or sore throat. Denies chest congestion, productive cough or wheezing. Denies chest pains, palpitations and leg swelling Denies abdominal pain, nausea, vomiting,diarrhea or constipation.   Denies dysuria, frequency, hesitancy or incontinence. Denies joint pain, swelling and limitation in mobility. Denies headaches, seizures, numbness, or tingling. Denies depression, anxiety or insomnia. Denies skin break down or rash.     Objective:   Physical Exam Patient alert and oriented and in no cardiopulmonary distress.  HEENT: No facial asymmetry, EOMI, no sinus tenderness,  oropharynx pink and moist.  Neck supple no adenopathy.  Chest: Clear to auscultation bilaterally.  CVS: S1, S2 no murmurs, no S3.  ABD: Soft non tender. Bowel sounds normal.  Ext: No edema  MS: Adequate ROM spine, shoulders, hips and knees.  Skin: Intact, no ulcerations or rash noted.  Psych: Good eye contact, normal affect. Memory intact not anxious or depressed appearing.  CNS: CN 2-12 intact, power, tone and sensation normal throughout.        Assessment & Plan:

## 2011-08-24 NOTE — Assessment & Plan Note (Signed)
Controlled, no change in medication  

## 2011-08-24 NOTE — Assessment & Plan Note (Signed)
Deteriorated, advised pt to go to class re dietary changes needed , and to commit to reg activity. Needs rept HBA1C in 3 months, then 6 months

## 2011-09-04 ENCOUNTER — Encounter: Payer: Self-pay | Admitting: Family Medicine

## 2011-10-05 ENCOUNTER — Telehealth: Payer: Self-pay | Admitting: Family Medicine

## 2011-10-05 NOTE — Telephone Encounter (Signed)
Called patient, left message.

## 2011-10-09 ENCOUNTER — Other Ambulatory Visit: Payer: Self-pay

## 2011-10-09 MED ORDER — ACCU-CHEK FASTCLIX LANCETS MISC: 1.0000 | Freq: Once | Status: DC

## 2011-10-09 MED ORDER — GLUCOSE BLOOD VI STRP: ORAL_STRIP | Status: DC

## 2011-10-09 MED ORDER — ONETOUCH ULTRASOFT LANCETS MISC: Status: DC

## 2011-10-09 MED ORDER — ACCU-CHEK AVIVA PLUS W/DEVICE KIT: 1.0000 | PACK | Freq: Once | Status: DC

## 2011-10-09 NOTE — Telephone Encounter (Signed)
Sent in - pt aware  

## 2011-10-26 ENCOUNTER — Other Ambulatory Visit: Payer: Self-pay | Admitting: Family Medicine

## 2011-11-16 ENCOUNTER — Telehealth: Payer: Self-pay | Admitting: Family Medicine

## 2011-11-16 NOTE — Telephone Encounter (Signed)
She said she has never seen him before and wants [place] to set it up. Sometime this week. Tried to call but they were closed until 1:30

## 2011-11-16 NOTE — Telephone Encounter (Signed)
Best that this pt sees orthopedics to eval foot pain weeks after trauma, she can call Dr Sanjuan Dame office for appt, I do nott believe she needs a referral, if she calls and has a prob let us know . So long after the injury with pain, I do not want to just "order an xray"

## 2011-11-16 NOTE — Telephone Encounter (Signed)
Faxed over papers and they will call patient with appointment patient is aware

## 2011-11-16 NOTE — Telephone Encounter (Signed)
States she dropped a can on her left foot a few weeks ago and thought it was just sore. Soaking in epsom salt and using ace bandage when walking. Still hurts, no swelling. Has been bothering her for a couple weeks now so she is getting concerned. Wants to know what to do? Xray?

## 2012-02-01 ENCOUNTER — Other Ambulatory Visit: Payer: Self-pay

## 2012-02-01 MED ORDER — LISINOPRIL-HYDROCHLOROTHIAZIDE 20-12.5 MG PO TABS: ORAL_TABLET | ORAL | Status: DC

## 2012-02-18 ENCOUNTER — Telehealth: Payer: Self-pay | Admitting: Family Medicine

## 2012-02-18 NOTE — Telephone Encounter (Signed)
Faxed to [facility]  

## 2012-02-22 ENCOUNTER — Other Ambulatory Visit: Payer: Self-pay | Admitting: Family Medicine

## 2012-02-22 ENCOUNTER — Encounter: Payer: BC Managed Care – PPO | Admitting: Family Medicine

## 2012-03-02 ENCOUNTER — Telehealth: Payer: Self-pay | Admitting: Family Medicine

## 2012-03-02 DIAGNOSIS — E785 Hyperlipidemia, unspecified: Secondary | ICD-10-CM

## 2012-03-02 DIAGNOSIS — R7301 Impaired fasting glucose: Secondary | ICD-10-CM

## 2012-03-02 DIAGNOSIS — I10 Essential (primary) hypertension: Secondary | ICD-10-CM

## 2012-03-02 NOTE — Telephone Encounter (Signed)
Lab order sent in

## 2012-03-04 LAB — BASIC METABOLIC PANEL
CO2: 25 mEq/L (ref 19–32)
Chloride: 103 mEq/L (ref 96–112)
Creat: 0.71 mg/dL (ref 0.50–1.10)
Potassium: 3.7 mEq/L (ref 3.5–5.3)

## 2012-03-04 LAB — LIPID PANEL
HDL: 38 mg/dL — ABNORMAL LOW (ref >39)
LDL Cholesterol: 91 mg/dL (ref 0–99)
Triglycerides: 240 mg/dL — ABNORMAL HIGH (ref <150)
VLDL: 48 mg/dL — ABNORMAL HIGH (ref 0–40)

## 2012-03-04 LAB — HEPATIC FUNCTION PANEL
Albumin: 4.2 g/dL (ref 3.5–5.2)
Alkaline Phosphatase: 32 U/L — ABNORMAL LOW (ref 39–117)
Total Bilirubin: 0.6 mg/dL (ref 0.3–1.2)
Total Protein: 7 g/dL (ref 6.0–8.3)

## 2012-03-05 LAB — HEMOGLOBIN A1C
Hgb A1c MFr Bld: 5.9 % — ABNORMAL HIGH (ref <5.7)
Mean Plasma Glucose: 123 mg/dL — ABNORMAL HIGH (ref <117)

## 2012-03-07 ENCOUNTER — Encounter: Payer: Self-pay | Admitting: Family Medicine

## 2012-03-07 ENCOUNTER — Ambulatory Visit (INDEPENDENT_AMBULATORY_CARE_PROVIDER_SITE_OTHER): Payer: BC Managed Care – PPO | Admitting: Family Medicine

## 2012-03-07 ENCOUNTER — Other Ambulatory Visit (HOSPITAL_COMMUNITY)
Admission: RE | Admit: 2012-03-07 | Discharge: 2012-03-07 | Disposition: A | Discharge status: Home / Self Care | Payer: BC Managed Care – PPO | Source: Ambulatory Visit | Attending: Family Medicine | Admitting: Family Medicine

## 2012-03-07 VITALS — BP 128/82 | HR 74 | Resp 15 | Ht 66.0 in | Wt 192.0 lb

## 2012-03-07 DIAGNOSIS — M79673 Pain in unspecified foot: Secondary | ICD-10-CM

## 2012-03-07 DIAGNOSIS — E785 Hyperlipidemia, unspecified: Secondary | ICD-10-CM

## 2012-03-07 DIAGNOSIS — R5381 Other malaise: Secondary | ICD-10-CM

## 2012-03-07 DIAGNOSIS — E8881 Metabolic syndrome: Secondary | ICD-10-CM

## 2012-03-07 DIAGNOSIS — I10 Essential (primary) hypertension: Secondary | ICD-10-CM

## 2012-03-07 DIAGNOSIS — R7301 Impaired fasting glucose: Secondary | ICD-10-CM

## 2012-03-07 DIAGNOSIS — Z0000 Encounter for general adult medical examination without abnormal findings: Secondary | ICD-10-CM

## 2012-03-07 DIAGNOSIS — M79672 Pain in left foot: Secondary | ICD-10-CM

## 2012-03-07 DIAGNOSIS — M79609 Pain in unspecified limb: Secondary | ICD-10-CM

## 2012-03-07 DIAGNOSIS — Z01419 Encounter for gynecological examination (general) (routine) without abnormal findings: Secondary | ICD-10-CM | POA: Insufficient documentation

## 2012-03-07 DIAGNOSIS — Z1211 Encounter for screening for malignant neoplasm of colon: Secondary | ICD-10-CM

## 2012-03-07 DIAGNOSIS — R0789 Other chest pain: Secondary | ICD-10-CM

## 2012-03-07 MED ORDER — METHYLPREDNISOLONE ACETATE 80 MG/ML IJ SUSP
80.0000 mg | Freq: Once | INTRAMUSCULAR | Status: AC
  Administered 2012-03-07: 80 mg via INTRAMUSCULAR

## 2012-03-07 NOTE — Patient Instructions (Addendum)
F/u in 6 month  EKG in the office today due to your complaint of chest pain several days ago, and you do have hypertension also.  Depo medrol injection in the office today for left foot pain with pressure, if this persists you need to return to podiatry, call if you need a referral.  It is important that you exercise regularly at least 30 minutes 5 times a week. If you develop chest pain, have severe difficulty breathing, or feel very tired, stop exercising immediately and seek medical attention    Weight loss goal of 5 to 10 pounds in the next 6 months.  Congrats on improved blood sugars with dietary change. Please cut back on cheese, your triglycerides are high  You need to schedule both an appointment with opthalmology as well as a mammogram, both are important  Fasting lipid, cmp , hBa1C and cbc in 6 month

## 2012-03-07 NOTE — Assessment & Plan Note (Signed)
Controlled, no change in medication  

## 2012-03-07 NOTE — Assessment & Plan Note (Signed)
Triglycerides elevated and HDL low , dietary change and regular exercise advised

## 2012-03-07 NOTE — Assessment & Plan Note (Signed)
Acute left chest pain 4 days ago , ekg normal, risk factor modification stressed

## 2012-03-07 NOTE — Assessment & Plan Note (Signed)
Ongoing left foot pain for months esp with pressure steroid injection in the office

## 2012-03-07 NOTE — Progress Notes (Signed)
  Subjective:    Patient ID: Meredith Sanchez, female    DOB: January 26, 1966, 46 y.o.   MRN: [id number]  HPI The PT is here for annual exam  and re-evaluation of chronic medical conditions, medication management and review of any available recent lab and radiology data.  Preventive health is updated, specifically  Cancer screening and Immunization.   Questions or concerns regarding consultations or procedures which the PT has had in the interim are  addressed. The PT denies any adverse reactions to current medications since the last visit.  C/o persistent left foot pain for months, has seen podiatry/orthopedics, no fracture reportedly. Requests specifically trial of in house steroid injection before re eval by specialist. Pain is primarily with direct pressure. C/o left chest pain 4 days ago while lying down, non radiating, took extra aspirin, concerned may have been experiencing cardiac issues.. No regula exercise , concerned about increasing abdominal girth as she should be as this is increasing her cardiac risk       Review of Systems    See HPI Denies recent fever or chills. Denies sinus pressure, nasal congestion, ear pain or sore throat. Denies chest congestion, productive cough or wheezing. Denies PND, orthopnea, palpitations and leg swelling Denies abdominal pain, nausea, vomiting,diarrhea or constipation.   Denies dysuria, frequency, hesitancy or incontinence. Denies joint pain, swelling and limitation in mobility. Denies headaches, seizures, numbness, or tingling. Denies depression, anxiety or insomnia. Denies skin break down or rash.     Objective:   Physical Exam Pleasant well nourished female, alert and oriented x 3, in no cardio-pulmonary distress. Afebrile. HEENT No facial trauma or asymetry. Sinuses non tender.  EOMI, PERTL, fundoscopic exam is normal, no hemorhage or exudate.  External ears normal, tympanic membranes clear. Oropharynx moist, no exudate, fair   dentition. Neck: supple, no adenopathy,JVD or thyromegaly.No bruits.  Chest: Clear to ascultation bilaterally.No crackles or wheezes. Non tender to palpation  Breast: No asymetry,no masses. No nipple discharge or inversion. No axillary or supraclavicular adenopathy  Cardiovascular system; Heart sounds normal,  S1 and  S2 ,no S3.  No murmur, or thrill. Apical beat not displaced Peripheral pulses normal.  Abdomen: Soft, non tender, no organomegaly or masses. No bruits. Bowel sounds normal. No guarding, tenderness or rebound.  Rectal:  No mass. Guaiac negative stool.  GU: External genitalia normal. No lesions. Vaginal canal normal.White  discharge. Uterus absent, no adnexal masses, no  adnexal tenderness.  Musculoskeletal exam: Full ROM of spine, hips , shoulders and knees. No deformity ,swelling or crepitus noted. No muscle wasting or atrophy. Tender over left foot, dorsum Neurologic: Cranial nerves 2 to 12 intact. Power, tone ,sensation and reflexes normal throughout. No disturbance in gait. No tremor.  Skin: Intact, no ulceration, erythema , scaling or rash noted. Pigmentation normal throughout  Psych; Normal mood and affect. Judgement and concentration normal       Assessment & Plan:

## 2012-03-23 ENCOUNTER — Encounter: Payer: Self-pay | Admitting: Family Medicine

## 2012-03-29 ENCOUNTER — Encounter: Payer: Self-pay | Admitting: Family Medicine

## 2012-06-06 ENCOUNTER — Telehealth: Payer: Self-pay | Admitting: Family Medicine

## 2012-06-07 ENCOUNTER — Ambulatory Visit: Payer: BC Managed Care – PPO | Admitting: Family Medicine

## 2012-06-09 NOTE — Telephone Encounter (Signed)
Called patient - advised she must have OV before we can prescribe meds -  we can get her in with Dr. Jeanice Lim late this afternoon if she calls soon.  Dr. Lodema Hong is full today and tomorrow.

## 2012-06-09 NOTE — Telephone Encounter (Signed)
Can't call in and get this. NEEDS APPT

## 2012-07-11 NOTE — Telephone Encounter (Signed)
According to [facility] - patient visited Urgent Care rather than come into office.  JSH

## 2012-07-28 ENCOUNTER — Other Ambulatory Visit: Payer: Self-pay | Admitting: Family Medicine

## 2012-08-02 ENCOUNTER — Other Ambulatory Visit: Payer: Self-pay

## 2012-08-02 MED ORDER — LISINOPRIL-HYDROCHLOROTHIAZIDE 20-12.5 MG PO TABS: ORAL_TABLET | ORAL | Status: DC

## 2012-08-10 ENCOUNTER — Other Ambulatory Visit: Payer: Self-pay | Admitting: Family Medicine

## 2012-08-24 ENCOUNTER — Other Ambulatory Visit: Payer: Self-pay | Admitting: Family Medicine

## 2012-09-01 ENCOUNTER — Telehealth: Payer: Self-pay | Admitting: Family Medicine

## 2012-09-01 DIAGNOSIS — E785 Hyperlipidemia, unspecified: Secondary | ICD-10-CM

## 2012-09-01 DIAGNOSIS — I10 Essential (primary) hypertension: Secondary | ICD-10-CM

## 2012-09-01 DIAGNOSIS — R7301 Impaired fasting glucose: Secondary | ICD-10-CM

## 2012-09-01 NOTE — Telephone Encounter (Signed)
Order faxed to the lab. 

## 2012-09-03 LAB — COMPREHENSIVE METABOLIC PANEL
ALT: 14 U/L (ref 0–35)
AST: 14 U/L (ref 0–37)
Alkaline Phosphatase: 34 U/L — ABNORMAL LOW (ref 39–117)
Creat: 0.75 mg/dL (ref 0.50–1.10)
Sodium: 138 mEq/L (ref 135–145)
Total Bilirubin: 0.4 mg/dL (ref 0.3–1.2)
Total Protein: 6.9 g/dL (ref 6.0–8.3)

## 2012-09-03 LAB — LIPID PANEL
HDL: 45 mg/dL (ref >39)
LDL Cholesterol: 91 mg/dL (ref 0–99)
Total CHOL/HDL Ratio: 4 Ratio
Triglycerides: 220 mg/dL — ABNORMAL HIGH (ref <150)
VLDL: 44 mg/dL — ABNORMAL HIGH (ref 0–40)

## 2012-09-04 LAB — HEMOGLOBIN A1C: Hgb A1c MFr Bld: 5.9 % — ABNORMAL HIGH (ref <5.7)

## 2012-09-05 ENCOUNTER — Ambulatory Visit (INDEPENDENT_AMBULATORY_CARE_PROVIDER_SITE_OTHER): Payer: BC Managed Care – PPO | Admitting: Family Medicine

## 2012-09-05 ENCOUNTER — Encounter: Payer: Self-pay | Admitting: Family Medicine

## 2012-09-05 VITALS — BP 130/80 | HR 80 | Resp 15 | Ht 66.0 in | Wt 191.1 lb

## 2012-09-05 DIAGNOSIS — R5383 Other fatigue: Secondary | ICD-10-CM

## 2012-09-05 DIAGNOSIS — M2550 Pain in unspecified joint: Secondary | ICD-10-CM

## 2012-09-05 DIAGNOSIS — I10 Essential (primary) hypertension: Secondary | ICD-10-CM

## 2012-09-05 DIAGNOSIS — R51 Headache: Secondary | ICD-10-CM

## 2012-09-05 DIAGNOSIS — R7301 Impaired fasting glucose: Secondary | ICD-10-CM

## 2012-09-05 DIAGNOSIS — E785 Hyperlipidemia, unspecified: Secondary | ICD-10-CM

## 2012-09-05 DIAGNOSIS — Z23 Encounter for immunization: Secondary | ICD-10-CM

## 2012-09-05 DIAGNOSIS — G4700 Insomnia, unspecified: Secondary | ICD-10-CM | POA: Insufficient documentation

## 2012-09-05 DIAGNOSIS — J309 Allergic rhinitis, unspecified: Secondary | ICD-10-CM

## 2012-09-05 DIAGNOSIS — R110 Nausea: Secondary | ICD-10-CM | POA: Insufficient documentation

## 2012-09-05 DIAGNOSIS — E663 Overweight: Secondary | ICD-10-CM

## 2012-09-05 MED ORDER — POTASSIUM CHLORIDE ER 10 MEQ PO TBCR: 10.0000 meq | EXTENDED_RELEASE_TABLET | Freq: Two times a day (BID) | ORAL | Status: DC

## 2012-09-05 MED ORDER — ONDANSETRON HCL 4 MG PO TABS: ORAL_TABLET | ORAL | Status: DC

## 2012-09-05 MED ORDER — PHENTERMINE HCL 37.5 MG PO TABS: 37.5000 mg | ORAL_TABLET | Freq: Every day | ORAL | Status: DC

## 2012-09-05 NOTE — Assessment & Plan Note (Signed)
Controlled with daily OTC loratidine

## 2012-09-05 NOTE — Assessment & Plan Note (Signed)
Unchanged hBa1C, pt still prediabetic and weight is unchanged. The importance of lifestyle modification and weight loss is stressed

## 2012-09-05 NOTE — Assessment & Plan Note (Signed)
Slightly improved, but TG elevated, due to excessive cheese intake Hyperlipidemia:Low fat diet discussed and encouraged. No med change

## 2012-09-05 NOTE — Patient Instructions (Addendum)
CPE April 8 or after  Please start exercising every day for at least 30 minutes   A healthy diet is rich in fruit, vegetables and whole grains. Poultry fish, nuts and beans are a healthy choice for protein rather then red meat. A low sodium diet and drinking 64 ounces of water daily is generally recommended. Oils and sweet should be limited. Carbohydrates especially for those who are diabetic or overweight, should be limited to 30-45 gram per meal. It is important to eat on a regular schedule, at least 3 times daily. Snacks should be primarily fruits, vegetables or nuts.   Weight loss goal of 10 to 12 pounds  Take HALF phentermine daily for 2 months   Fasting lipid, cmp HBA1C , cbc, tsh In April before visit  New meds are potassium , phentermine, and zofran

## 2012-09-05 NOTE — Assessment & Plan Note (Signed)
Improved, but has been taking ibuprofen daily. Advised against this due to potential adverse side effects, and alternating this with tylenol as needed The importance of physical activity, strestches and weight loss is stressed also use of topical agents

## 2012-09-05 NOTE — Assessment & Plan Note (Signed)
Unchanged. Patient re-educated about  the importance of commitment to a  minimum of 150 minutes of exercise per week. The importance of healthy food choices with portion control discussed. Encouraged to start a food diary, count calories and to consider  joining a support group. Sample diet sheets offered. Goals set by the patient for the next several months.    

## 2012-09-05 NOTE — Assessment & Plan Note (Signed)
Sleep hygiene discussed, pt uses benadryl every night, ook to continue same

## 2012-09-05 NOTE — Progress Notes (Signed)
  Subjective:    Patient ID: Meredith Sanchez, female    DOB: July 28, 1966, 46 y.o.   MRN: [id number]  HPI  The PT is here for follow up and re-evaluation of chronic medical conditions, medication management and review of any available recent lab and radiology data.  Preventive health is updated, specifically  Cancer screening and Immunization.   Questions or concerns regarding consultations or procedures which the PT has had in the interim are  Addressed.Had 2 urgent care visits since last visit The PT denies any adverse reactions to current medications since the last visit.  Concerned about weight , has not been exercising, intends to start, wants limited quantity of phentermine to help with appetite suppression    Review of Systems See HPI Denies recent fever or chills. Denies sinus pressure, nasal congestion, ear pain or sore throat. Denies chest congestion, productive cough or wheezing. Denies chest pains, palpitations and leg swelling Denies abdominal pain, nausea, vomiting,diarrhea or constipation.   Denies dysuria, frequency, hesitancy or incontinence. Denies joint pain, swelling and limitation in mobility. Denies headaches, seizures, numbness, or tingling. Denies depression, anxiety or insomnia. .        Objective:   Physical Exam  Patient alert and oriented and in no cardiopulmonary distress.  HEENT: No facial asymmetry, EOMI, no sinus tenderness,  oropharynx pink and moist.  Neck supple no adenopathy.  Chest: Clear to auscultation bilaterally.  CVS: S1, S2 no murmurs, no S3.  ABD: Soft non tender. Bowel sounds normal.  Ext: No edema  MS: Adequate ROM spine, shoulders, hips and knees.  Skin: Intact, no ulcerations or rash noted.  Psych: Good eye contact, normal affect. Memory intact not anxious or depressed appearing.  CNS: CN 2-12 intact, power, tone and sensation normal throughout.         Assessment & Plan:

## 2012-09-05 NOTE — Assessment & Plan Note (Signed)
Controlled, no change in medication DASH diet and commitment to daily physical activity for a minimum of 30 minutes discussed and encouraged, as a part of hypertension management. The importance of attaining a healthy weight is also discussed.  

## 2012-09-05 NOTE — Assessment & Plan Note (Signed)
Uses fioricet on average 3 times per week, has nausea with the headache , requests non sedating med for this, will prescribe zofran Also suggested cautious use of ibuprofen for headache management

## 2012-11-14 ENCOUNTER — Other Ambulatory Visit: Payer: Self-pay

## 2012-11-14 ENCOUNTER — Telehealth: Payer: Self-pay | Admitting: Family Medicine

## 2012-11-14 MED ORDER — PRAVASTATIN SODIUM 40 MG PO TABS: ORAL_TABLET | ORAL | Status: DC

## 2012-11-15 NOTE — Telephone Encounter (Signed)
Med refilled 12/16

## 2012-12-23 ENCOUNTER — Ambulatory Visit (INDEPENDENT_AMBULATORY_CARE_PROVIDER_SITE_OTHER): Payer: BC Managed Care – PPO

## 2012-12-23 ENCOUNTER — Telehealth: Payer: Self-pay | Admitting: Family Medicine

## 2012-12-23 VITALS — Wt 191.0 lb

## 2012-12-23 DIAGNOSIS — N390 Urinary tract infection, site not specified: Secondary | ICD-10-CM

## 2012-12-23 LAB — POCT URINALYSIS DIPSTICK
Bilirubin, UA: NEGATIVE
Blood, UA: NEGATIVE
Glucose, UA: NEGATIVE
Ketones, UA: NEGATIVE
Leukocytes, UA: NEGATIVE
Nitrite, UA: NEGATIVE
Protein, UA: NEGATIVE
Spec Grav, UA: <=1.005
Urobilinogen, UA: 0.2
pH, UA: 6

## 2012-12-23 NOTE — Telephone Encounter (Signed)
If she has positive nitrite or leukocyte , then cipro 500mg  1 twice daily #6 only

## 2012-12-23 NOTE — Telephone Encounter (Signed)
Based  on symptoms I do not believe yeast, stop spray this is the likely culprit

## 2012-12-23 NOTE — Telephone Encounter (Signed)
Please advise.  Do you want to send her to to lab for u-culture?

## 2012-12-23 NOTE — Telephone Encounter (Signed)
Patient aware.

## 2012-12-23 NOTE — Telephone Encounter (Signed)
pls document sym[ptoms, if severe, nurse CCUa in office this morning preferred , based on that i will send in 3 days of abiotics if abn, and specimen for c/s to be sent

## 2012-12-23 NOTE — Progress Notes (Signed)
Pt was reporting dysuria for the past few days and she has been using FDS spray in her vaginal area and has had some vaginal irritation as well. UA was negative for infection.

## 2012-12-23 NOTE — Telephone Encounter (Signed)
Patient will have to come after lunch.  What antibiotic would you like to prescribe if it is abn.

## 2012-12-23 NOTE — Telephone Encounter (Signed)
UA was negative. Pt has been using FDS spray. Wants to know if it could be a yeast infection, no discharge or itch

## 2013-01-08 ENCOUNTER — Other Ambulatory Visit: Payer: Self-pay | Admitting: Family Medicine

## 2013-03-08 ENCOUNTER — Other Ambulatory Visit: Payer: Self-pay | Admitting: Family Medicine

## 2013-03-09 ENCOUNTER — Telehealth: Payer: Self-pay | Admitting: Family Medicine

## 2013-03-09 DIAGNOSIS — E785 Hyperlipidemia, unspecified: Secondary | ICD-10-CM

## 2013-03-09 DIAGNOSIS — E663 Overweight: Secondary | ICD-10-CM

## 2013-03-09 DIAGNOSIS — R5381 Other malaise: Secondary | ICD-10-CM

## 2013-03-09 DIAGNOSIS — I10 Essential (primary) hypertension: Secondary | ICD-10-CM

## 2013-03-09 DIAGNOSIS — Z0000 Encounter for general adult medical examination without abnormal findings: Secondary | ICD-10-CM

## 2013-03-09 DIAGNOSIS — R7301 Impaired fasting glucose: Secondary | ICD-10-CM

## 2013-03-09 NOTE — Telephone Encounter (Signed)
Labs faxed

## 2013-03-10 ENCOUNTER — Telehealth: Payer: Self-pay

## 2013-03-10 NOTE — Addendum Note (Signed)
Addended by: Kandis Fantasia B on: 03/10/2013 02:58 PM   Modules accepted: Orders

## 2013-03-11 LAB — BASIC METABOLIC PANEL
BUN: 16 mg/dL (ref 6–23)
Calcium: 9.6 mg/dL (ref 8.4–10.5)
Chloride: 101 mEq/L (ref 96–112)
Creat: 0.81 mg/dL (ref 0.50–1.10)

## 2013-03-11 LAB — CBC WITH DIFFERENTIAL/PLATELET
Basophils Absolute: 0 10*3/uL (ref 0.0–0.1)
Basophils Relative: 0 % (ref 0–1)
Eosinophils Absolute: 0.2 10*3/uL (ref 0.0–0.7)
Eosinophils Relative: 2 % (ref 0–5)
HCT: 33.5 % — ABNORMAL LOW (ref 36.0–46.0)
MCH: 32.2 pg (ref 26.0–34.0)
MCHC: 35.8 g/dL (ref 30.0–36.0)
MCV: 89.8 fL (ref 78.0–100.0)
Monocytes Absolute: 0.6 10*3/uL (ref 0.1–1.0)
Platelets: 332 10*3/uL (ref 150–400)
RDW: 14.5 % (ref 11.5–15.5)
WBC: 6.2 10*3/uL (ref 4.0–10.5)

## 2013-03-11 LAB — LIPID PANEL
Cholesterol: 175 mg/dL (ref 0–200)
Triglycerides: 157 mg/dL — ABNORMAL HIGH (ref <150)
VLDL: 31 mg/dL (ref 0–40)

## 2013-03-13 ENCOUNTER — Ambulatory Visit (INDEPENDENT_AMBULATORY_CARE_PROVIDER_SITE_OTHER): Payer: BC Managed Care – PPO | Admitting: Family Medicine

## 2013-03-13 ENCOUNTER — Other Ambulatory Visit (HOSPITAL_COMMUNITY)
Admission: RE | Admit: 2013-03-13 | Discharge: 2013-03-13 | Disposition: A | Discharge status: Home / Self Care | Payer: BC Managed Care – PPO | Source: Ambulatory Visit | Attending: Family Medicine | Admitting: Family Medicine

## 2013-03-13 ENCOUNTER — Other Ambulatory Visit: Payer: Self-pay | Admitting: Family Medicine

## 2013-03-13 ENCOUNTER — Encounter: Payer: Self-pay | Admitting: Family Medicine

## 2013-03-13 VITALS — BP 122/76 | HR 84 | Resp 18 | Ht 66.0 in | Wt 193.0 lb

## 2013-03-13 DIAGNOSIS — Z1151 Encounter for screening for human papillomavirus (HPV): Secondary | ICD-10-CM | POA: Insufficient documentation

## 2013-03-13 DIAGNOSIS — Z0000 Encounter for general adult medical examination without abnormal findings: Secondary | ICD-10-CM

## 2013-03-13 DIAGNOSIS — E785 Hyperlipidemia, unspecified: Secondary | ICD-10-CM

## 2013-03-13 DIAGNOSIS — Z1212 Encounter for screening for malignant neoplasm of rectum: Secondary | ICD-10-CM

## 2013-03-13 DIAGNOSIS — Z01419 Encounter for gynecological examination (general) (routine) without abnormal findings: Secondary | ICD-10-CM | POA: Insufficient documentation

## 2013-03-13 DIAGNOSIS — Z1211 Encounter for screening for malignant neoplasm of colon: Secondary | ICD-10-CM

## 2013-03-13 LAB — POC HEMOCCULT BLD/STL (OFFICE/1-CARD/DIAGNOSTIC): Fecal Occult Blood, POC: NEGATIVE

## 2013-03-13 MED ORDER — LOVASTATIN 40 MG PO TABS: 40.0000 mg | ORAL_TABLET | Freq: Every day | ORAL | Status: DC

## 2013-03-13 MED ORDER — LOVASTATIN 10 MG PO TABS: 10.0000 mg | ORAL_TABLET | Freq: Every day | ORAL | Status: DC

## 2013-03-13 NOTE — Patient Instructions (Addendum)
F/u in 6 month, please call if you need me before  New medication , lovastatin for cholesterol.   Labs have improved, congrats, keep this up  Use skimmed, milk not regular milk please.  Please commit to 30 minutes of physical activity daily  New medication, lovastatin 40 ng one daily for cholesterol due to lower cost. Dietary change will make all the difference!

## 2013-03-13 NOTE — Assessment & Plan Note (Signed)
Annual exam as documented. Importance of commitment to regular exercise also healthy eating is stressed. No med changes, pt remined that other than her weight , she does have co morbidities  Oh dyslipidemia and prediabetes to work on

## 2013-03-13 NOTE — Progress Notes (Signed)
  Subjective:    Patient ID: Meredith Sanchez, female    DOB: [birth year], 47 y.o.   MRN: [id number]  HPI The PT is here for annual exam and re-evaluation of chronic medical conditions, medication management and review of any available recent lab and radiology data.  Preventive health is updated, specifically  Cancer screening and Immunization.    The PT denies any adverse reactions to current medications since the last visit.  There are no new concerns.  There are no specific complaints . States she does not want to lose weight but wants to be healthy      Review of Systems See HPI Denies recent fever or chills. Denies sinus pressure, nasal congestion, ear pain or sore throat. Denies chest congestion, productive cough or wheezing. Denies chest pains, palpitations and leg swelling Denies abdominal pain, nausea, vomiting,diarrhea or constipation.   Denies dysuria, frequency, hesitancy or incontinence. Denies joint pain, swelling and limitation in mobility. Denies headaches, seizures, numbness, or tingling. Denies depression, anxiety or insomnia. Denies skin break down or rash.        Objective:   Physical Exam Pleasant well nourished female, alert and oriented x 3, in no cardio-pulmonary distress. Afebrile. HEENT No facial trauma or asymetry. Sinuses non tender.  EOMI, PERTL, fundoscopic exam is normal, no hemorhage or exudate.  External ears normal, tympanic membranes clear. Oropharynx moist, no exudate, good dentition. Neck: supple, no adenopathy,JVD or thyromegaly.No bruits.  Chest: Clear to ascultation bilaterally.No crackles or wheezes. Non tender to palpation  Breast: No asymetry,no masses. No nipple discharge or inversion. No axillary or supraclavicular adenopathy  Cardiovascular system; Heart sounds normal,  S1 and  S2 ,no S3.  No murmur, or thrill. Apical beat not displaced Peripheral pulses normal.  Abdomen: Soft, non tender, no organomegaly or  masses. No bruits. Bowel sounds normal. No guarding, tenderness or rebound.  Rectal:  No mass. Guaiac negative stool.  GU: External genitalia normal. No lesions. Vaginal canal normal.No discharge. Uterus absent  size, no adnexal masses, no adnexal tenderness.  Musculoskeletal exam: Full ROM of spine, hips , shoulders and knees. No deformity ,swelling or crepitus noted. No muscle wasting or atrophy.   Neurologic: Cranial nerves 2 to 12 intact. Power, tone ,sensation and reflexes normal throughout. No disturbance in gait. No tremor.  Skin: Intact, no ulceration, erythema , scaling or rash noted. Pigmentation normal throughout  Psych; Normal mood and affect. Judgement and concentration normal        Assessment & Plan:

## 2013-03-14 NOTE — Telephone Encounter (Signed)
Lab ordered.

## 2013-03-15 ENCOUNTER — Telehealth: Payer: Self-pay | Admitting: Family Medicine

## 2013-03-15 NOTE — Telephone Encounter (Signed)
Patient aware that she has the correct dose

## 2013-04-14 ENCOUNTER — Encounter: Payer: Self-pay | Admitting: Family Medicine

## 2013-06-12 ENCOUNTER — Other Ambulatory Visit: Payer: Self-pay

## 2013-06-12 MED ORDER — LISINOPRIL-HYDROCHLOROTHIAZIDE 20-12.5 MG PO TABS: ORAL_TABLET | ORAL | Status: DC

## 2013-06-23 ENCOUNTER — Ambulatory Visit: Payer: BC Managed Care – PPO | Admitting: Family Medicine

## 2013-06-28 ENCOUNTER — Other Ambulatory Visit: Payer: Self-pay

## 2013-06-28 MED ORDER — IBUPROFEN 800 MG PO TABS: ORAL_TABLET | ORAL | Status: DC

## 2013-09-20 ENCOUNTER — Telehealth: Payer: Self-pay

## 2013-09-20 DIAGNOSIS — E785 Hyperlipidemia, unspecified: Secondary | ICD-10-CM

## 2013-09-20 DIAGNOSIS — Z139 Encounter for screening, unspecified: Secondary | ICD-10-CM

## 2013-09-20 DIAGNOSIS — R7301 Impaired fasting glucose: Secondary | ICD-10-CM

## 2013-09-20 DIAGNOSIS — E8881 Metabolic syndrome: Secondary | ICD-10-CM

## 2013-09-20 DIAGNOSIS — I10 Essential (primary) hypertension: Secondary | ICD-10-CM

## 2013-09-20 NOTE — Telephone Encounter (Signed)
Labs ordered and faxed.

## 2013-09-20 NOTE — Telephone Encounter (Signed)
pls order cbc, (anemia) fasting lipid, cmp , HBA1C and vit D

## 2013-09-20 NOTE — Addendum Note (Signed)
Addended by: Abner Greenspan on: 09/20/2013 01:26 PM   Modules accepted: Orders

## 2013-09-20 NOTE — Telephone Encounter (Signed)
Wants to get labs before her visit but I don't see any ordered at her last visit

## 2013-09-22 LAB — CBC WITH DIFFERENTIAL/PLATELET
Basophils Absolute: 0 10*3/uL (ref 0.0–0.1)
Eosinophils Relative: 1 % (ref 0–5)
HCT: 37.2 % (ref 36.0–46.0)
Hemoglobin: 13 g/dL (ref 12.0–15.0)
Lymphocytes Relative: 28 % (ref 12–46)
MCHC: 34.9 g/dL (ref 30.0–36.0)
MCV: 91.9 fL (ref 78.0–100.0)
Monocytes Absolute: 0.6 10*3/uL (ref 0.1–1.0)
Monocytes Relative: 8 % (ref 3–12)
RDW: 14.9 % (ref 11.5–15.5)
WBC: 7.1 10*3/uL (ref 4.0–10.5)

## 2013-09-23 LAB — COMPREHENSIVE METABOLIC PANEL
AST: 17 U/L (ref 0–37)
Albumin: 4.6 g/dL (ref 3.5–5.2)
BUN: 12 mg/dL (ref 6–23)
Calcium: 10 mg/dL (ref 8.4–10.5)
Chloride: 101 mEq/L (ref 96–112)
Creat: 0.71 mg/dL (ref 0.50–1.10)
Glucose, Bld: 81 mg/dL (ref 70–99)

## 2013-09-23 LAB — LIPID PANEL
HDL: 46 mg/dL (ref >39)
Triglycerides: 152 mg/dL — ABNORMAL HIGH (ref <150)

## 2013-09-23 LAB — VITAMIN D 25 HYDROXY (VIT D DEFICIENCY, FRACTURES): Vit D, 25-Hydroxy: 40 ng/mL (ref 30–89)

## 2013-09-23 LAB — HEMOGLOBIN A1C
Hgb A1c MFr Bld: 5.8 % — ABNORMAL HIGH (ref <5.7)
Mean Plasma Glucose: 120 mg/dL — ABNORMAL HIGH (ref <117)

## 2013-09-25 ENCOUNTER — Encounter (INDEPENDENT_AMBULATORY_CARE_PROVIDER_SITE_OTHER): Payer: Self-pay

## 2013-09-25 ENCOUNTER — Encounter: Payer: Self-pay | Admitting: Family Medicine

## 2013-09-25 ENCOUNTER — Other Ambulatory Visit: Payer: Self-pay

## 2013-09-25 ENCOUNTER — Ambulatory Visit (INDEPENDENT_AMBULATORY_CARE_PROVIDER_SITE_OTHER): Payer: BC Managed Care – PPO | Admitting: Family Medicine

## 2013-09-25 VITALS — BP 138/82 | HR 83 | Resp 16 | Wt 197.1 lb

## 2013-09-25 DIAGNOSIS — J309 Allergic rhinitis, unspecified: Secondary | ICD-10-CM

## 2013-09-25 DIAGNOSIS — E663 Overweight: Secondary | ICD-10-CM

## 2013-09-25 DIAGNOSIS — E785 Hyperlipidemia, unspecified: Secondary | ICD-10-CM

## 2013-09-25 DIAGNOSIS — R7301 Impaired fasting glucose: Secondary | ICD-10-CM

## 2013-09-25 DIAGNOSIS — R51 Headache: Secondary | ICD-10-CM

## 2013-09-25 DIAGNOSIS — I10 Essential (primary) hypertension: Secondary | ICD-10-CM

## 2013-09-25 DIAGNOSIS — Z23 Encounter for immunization: Secondary | ICD-10-CM

## 2013-09-25 MED ORDER — LOVASTATIN 20 MG PO TABS: 20.0000 mg | ORAL_TABLET | Freq: Every day | ORAL | Status: DC

## 2013-09-25 MED ORDER — TOPIRAMATE 25 MG PO TABS: 25.0000 mg | ORAL_TABLET | Freq: Two times a day (BID) | ORAL | Status: DC

## 2013-09-25 MED ORDER — IBUPROFEN 800 MG PO TABS: ORAL_TABLET | ORAL | Status: DC

## 2013-09-25 NOTE — Patient Instructions (Signed)
CPE and pap April 15 or after, call if you need me before  Flu vaccine today  Reduce cheese intake and increase vegetable and fruit, fresh or frozen  Start exercise for at least 30 minutes 5 days per week, this will improve your health in more ways than you can imagine  Dose increase in cholesterol medication as discussed, take TWO lovastatin 10mg  tabs every night till done  To reduce headache frequency start topaamx 25 mg one daily, for 1 week, then increase to two at bedtrime (script is written for 1 twice daily). You should  Not take pain meds every day , as this increases headache frequency. Stress will reduce wuth exercise which will also help your headaches  CBC, fasting lipid, cmp, HBA1C and TSH April  14 or after but before visit

## 2013-09-25 NOTE — Assessment & Plan Note (Signed)
Elevated LDL, increase med dose Hyperlipidemia:Low fat diet discussed and encouraged.

## 2013-09-25 NOTE — Assessment & Plan Note (Signed)
Unchanged Patient educated about the importance of limiting  Carbohydrate intake , the need to commit to daily physical activity for a minimum of 30 minutes , and to commit weight loss. The fact that changes in all these areas will reduce or eliminate all together the development of diabetes is stressed.    

## 2013-09-25 NOTE — Assessment & Plan Note (Signed)
Controlled, no change in medication Uses  Benadryl at night also

## 2013-09-25 NOTE — Assessment & Plan Note (Signed)
Deteriorated. Patient re-educated about  the importance of commitment to a  minimum of 150 minutes of exercise per week. The importance of healthy food choices with portion control discussed. Encouraged to start a food diary, count calories and to consider  joining a support group. Sample diet sheets offered. Goals set by the patient for the next several months.    

## 2013-09-25 NOTE — Progress Notes (Signed)
  Subjective:    Patient ID: Meredith Sanchez, female    DOB: [birth year], 47 y.o.   MRN: [id number]  HPI The PT is here for follow up and re-evaluation of chronic medical conditions, medication management and review of any available recent lab and radiology data.  Preventive health is updated, specifically  Cancer screening and Immunization.    The PT denies any adverse reactions to current medications since the last visit.  There are no new concerns. Reports that work  Remains stressful, using medication for headache daily. No regular exercise and still no vegetables. Happy with her weight, concerned about her cholesterol, does admit to eating a lot of cheese There are no specific complaints       Review of Systems See HPI Denies recent fever or chills. Denies sinus pressure, nasal congestion, ear pain or sore throat. Denies chest congestion, productive cough or wheezing. Denies chest pains, palpitations and leg swelling Denies abdominal pain, nausea, vomiting,diarrhea or constipation.   Denies dysuria, frequency, hesitancy or incontinence. Denies joint pain, swelling and limitation in mobility. Denies  seizures, numbness, or tingling. Denies depression, anxiety or insomnia. Denies skin break down or rash.        Objective:   Physical Exam  Patient alert and oriented and in no cardiopulmonary distress.  HEENT: No facial asymmetry, EOMI, no sinus tenderness,  oropharynx pink and moist.  Neck supple no adenopathy.  Chest: Clear to auscultation bilaterally.  CVS: S1, S2 no murmurs, no S3.  ABD: Soft non tender. Bowel sounds normal.  Ext: No edema  MS: Adequate ROM spine, shoulders, hips and knees.  Skin: Intact, no ulcerations or rash noted.  Psych: Good eye contact, normal affect. Memory intact not anxious or depressed appearing.  CNS: CN 2-12 intact, power, tone and sensation normal throughout.       Assessment & Plan:

## 2013-09-25 NOTE — Assessment & Plan Note (Signed)
Controlled, no change in medication DASH diet and commitment to daily physical activity for a minimum of 30 minutes discussed and encouraged, as a part of hypertension management. The importance of attaining a healthy weight is also discussed.  

## 2013-09-25 NOTE — Assessment & Plan Note (Signed)
Uncontrolled, reports daily headaches, start topamax and exercise

## 2013-11-29 ENCOUNTER — Telehealth: Payer: Self-pay | Admitting: Family Medicine

## 2013-11-29 MED ORDER — ALBUTEROL SULFATE HFA 108 (90 BASE) MCG/ACT IN AERS: 2.0000 | INHALATION_SPRAY | Freq: Four times a day (QID) | RESPIRATORY_TRACT | Status: DC | PRN

## 2013-11-29 NOTE — Telephone Encounter (Signed)
Patient is asking for an albuterol inhaler due to wheezing and SOB.  She states that this is a chronic problem and the weather makes it worse.  Please advise.

## 2013-11-29 NOTE — Telephone Encounter (Signed)
Patient aware.

## 2013-11-29 NOTE — Telephone Encounter (Signed)
Script sent pls let her know, verify correct pharmacy pls

## 2013-12-27 ENCOUNTER — Other Ambulatory Visit: Payer: Self-pay | Admitting: Family Medicine

## 2014-02-06 ENCOUNTER — Telehealth: Payer: Self-pay

## 2014-02-06 NOTE — Telephone Encounter (Signed)
Recommend ENT eval for left ear pain with possible foreign body x 2 weeks, [facility] if she agrees

## 2014-02-07 NOTE — Telephone Encounter (Signed)
Patient aware but does not want to see ent.  Given appt for 3/12

## 2014-02-08 ENCOUNTER — Ambulatory Visit: Payer: BC Managed Care – PPO | Admitting: Family Medicine

## 2014-02-13 ENCOUNTER — Other Ambulatory Visit: Payer: Self-pay | Admitting: Family Medicine

## 2014-03-16 ENCOUNTER — Encounter: Payer: BC Managed Care – PPO | Admitting: Family Medicine

## 2014-03-23 ENCOUNTER — Encounter: Payer: BC Managed Care – PPO | Admitting: Family Medicine

## 2014-04-18 ENCOUNTER — Encounter: Payer: BC Managed Care – PPO | Admitting: Family Medicine

## 2014-05-02 ENCOUNTER — Other Ambulatory Visit: Payer: Self-pay | Admitting: Family Medicine

## 2014-05-24 ENCOUNTER — Other Ambulatory Visit: Payer: Self-pay | Admitting: Family Medicine

## 2014-07-09 ENCOUNTER — Other Ambulatory Visit: Payer: Self-pay | Admitting: Family Medicine

## 2014-07-09 LAB — CBC
HEMATOCRIT: 33.3 % — AB (ref 36.0–46.0)
Hemoglobin: 11.7 g/dL — ABNORMAL LOW (ref 12.0–15.0)
MCH: 32.2 pg (ref 26.0–34.0)
MCHC: 35.1 g/dL (ref 30.0–36.0)
MCV: 91.7 fL (ref 78.0–100.0)
Platelets: 331 10*3/uL (ref 150–400)
RBC: 3.63 MIL/uL — ABNORMAL LOW (ref 3.87–5.11)
RDW: 14.3 % (ref 11.5–15.5)
WBC: 5.9 10*3/uL (ref 4.0–10.5)

## 2014-07-09 LAB — HEMOGLOBIN A1C
Hgb A1c MFr Bld: 6.1 % — ABNORMAL HIGH (ref <5.7)
Mean Plasma Glucose: 128 mg/dL — ABNORMAL HIGH (ref <117)

## 2014-07-09 LAB — TSH: TSH: 2.134 u[IU]/mL (ref 0.350–4.500)

## 2014-07-10 ENCOUNTER — Other Ambulatory Visit (HOSPITAL_COMMUNITY)
Admission: RE | Admit: 2014-07-10 | Discharge: 2014-07-10 | Disposition: A | Discharge status: Home / Self Care | Payer: BC Managed Care – PPO | Source: Ambulatory Visit | Attending: Family Medicine | Admitting: Family Medicine

## 2014-07-10 ENCOUNTER — Ambulatory Visit (INDEPENDENT_AMBULATORY_CARE_PROVIDER_SITE_OTHER): Payer: BC Managed Care – PPO | Admitting: Family Medicine

## 2014-07-10 ENCOUNTER — Encounter (INDEPENDENT_AMBULATORY_CARE_PROVIDER_SITE_OTHER): Payer: Self-pay

## 2014-07-10 ENCOUNTER — Encounter: Payer: Self-pay | Admitting: Family Medicine

## 2014-07-10 VITALS — BP 142/80 | HR 86 | Resp 18 | Ht 66.0 in | Wt 196.0 lb

## 2014-07-10 DIAGNOSIS — J309 Allergic rhinitis, unspecified: Secondary | ICD-10-CM

## 2014-07-10 DIAGNOSIS — E785 Hyperlipidemia, unspecified: Secondary | ICD-10-CM

## 2014-07-10 DIAGNOSIS — D649 Anemia, unspecified: Secondary | ICD-10-CM

## 2014-07-10 DIAGNOSIS — Z0000 Encounter for general adult medical examination without abnormal findings: Secondary | ICD-10-CM

## 2014-07-10 DIAGNOSIS — Z1151 Encounter for screening for human papillomavirus (HPV): Secondary | ICD-10-CM | POA: Insufficient documentation

## 2014-07-10 DIAGNOSIS — Z124 Encounter for screening for malignant neoplasm of cervix: Secondary | ICD-10-CM

## 2014-07-10 DIAGNOSIS — Z1211 Encounter for screening for malignant neoplasm of colon: Secondary | ICD-10-CM

## 2014-07-10 DIAGNOSIS — I10 Essential (primary) hypertension: Secondary | ICD-10-CM

## 2014-07-10 DIAGNOSIS — R51 Headache: Secondary | ICD-10-CM

## 2014-07-10 DIAGNOSIS — R7301 Impaired fasting glucose: Secondary | ICD-10-CM

## 2014-07-10 LAB — IRON: IRON: 76 ug/dL (ref 42–145)

## 2014-07-10 LAB — LIPID PANEL
CHOL/HDL RATIO: 3.6 ratio
Cholesterol: 173 mg/dL (ref 0–200)
HDL: 48 mg/dL (ref >39)
LDL CALC: 80 mg/dL (ref 0–99)
Triglycerides: 226 mg/dL — ABNORMAL HIGH (ref <150)
VLDL: 45 mg/dL — ABNORMAL HIGH (ref 0–40)

## 2014-07-10 LAB — COMPREHENSIVE METABOLIC PANEL
ALK PHOS: 29 U/L — AB (ref 39–117)
ALT: 14 U/L (ref 0–35)
AST: 15 U/L (ref 0–37)
Albumin: 3.8 g/dL (ref 3.5–5.2)
BUN: 15 mg/dL (ref 6–23)
CO2: 26 mEq/L (ref 19–32)
CREATININE: 0.75 mg/dL (ref 0.50–1.10)
Calcium: 8.8 mg/dL (ref 8.4–10.5)
Chloride: 104 mEq/L (ref 96–112)
Glucose, Bld: 89 mg/dL (ref 70–99)
Potassium: 3.8 mEq/L (ref 3.5–5.3)
Sodium: 138 mEq/L (ref 135–145)
Total Bilirubin: 0.4 mg/dL (ref 0.2–1.2)
Total Protein: 6.4 g/dL (ref 6.0–8.3)

## 2014-07-10 LAB — FERRITIN: FERRITIN: 134 ng/mL (ref 10–291)

## 2014-07-10 LAB — POC HEMOCCULT BLD/STL (OFFICE/1-CARD/DIAGNOSTIC): Fecal Occult Blood, POC: NEGATIVE

## 2014-07-10 MED ORDER — TRIAMTERENE-HCTZ 37.5-25 MG PO TABS: 1.0000 | ORAL_TABLET | Freq: Every day | ORAL | Status: DC

## 2014-07-10 MED ORDER — MONTELUKAST SODIUM 10 MG PO TABS: 10.0000 mg | ORAL_TABLET | Freq: Every day | ORAL | Status: DC

## 2014-07-10 NOTE — Patient Instructions (Addendum)
F/u in early October, call if you need me before  Stop benazepril//hCTZ, new for blood pressure is triamterene 25 mg one daily  I bel;ieve that you are allergic to benazepril which is why you have had several urgent care/ED visits  New for allergies is singulair , take in place of claritin  You are referred to Dr Darrick PennaFields because you are anemic  Triglycerides are high, reduce fried and fatty foods ansdred meat  Eat more fresh/frozen fruit and vegetable , LESS processed and foods that you have too spend a lot of time "preparing " in the kitchen  Commit to walking every day for at least 20  Minutes please  PLs sched and get your mammogram

## 2014-07-11 ENCOUNTER — Other Ambulatory Visit: Payer: Self-pay

## 2014-07-11 ENCOUNTER — Encounter: Payer: Self-pay | Admitting: Family Medicine

## 2014-07-11 DIAGNOSIS — J309 Allergic rhinitis, unspecified: Secondary | ICD-10-CM

## 2014-07-11 DIAGNOSIS — I10 Essential (primary) hypertension: Secondary | ICD-10-CM

## 2014-07-11 MED ORDER — MONTELUKAST SODIUM 10 MG PO TABS: 10.0000 mg | ORAL_TABLET | Freq: Every day | ORAL | Status: DC

## 2014-07-11 MED ORDER — TRIAMTERENE-HCTZ 37.5-25 MG PO TABS: 1.0000 | ORAL_TABLET | Freq: Every day | ORAL | Status: DC

## 2014-07-11 MED ORDER — BUTALBITAL-APAP-CAFFEINE 50-325-40 MG PO TABS: ORAL_TABLET | ORAL | Status: DC

## 2014-07-11 NOTE — Assessment & Plan Note (Signed)
Anemia in pt who has no menses, refer for GI eval

## 2014-07-12 LAB — CYTOLOGY - PAP

## 2014-07-13 ENCOUNTER — Encounter: Payer: Self-pay | Admitting: Gastroenterology

## 2014-07-16 NOTE — Assessment & Plan Note (Signed)
Not at goal and likely ACE allergy. Change to maxzide DASH diet and commitment to daily physical activity for a minimum of 30 minutes discussed and encouraged, as a part of hypertension management. The importance of attaining a healthy weight is also discussed.

## 2014-07-16 NOTE — Assessment & Plan Note (Signed)
Markedly elevated TG, reduce fat intake , no med change at this time

## 2014-07-16 NOTE — Progress Notes (Signed)
Subjective:    Patient ID: Meredith Sanchez, female    DOB: Mar 14, 1966, 48 y.o.   MRN: [id number]  HPI Patient is in for annual physical exam. Review and f/u chromic health conditions and updated labs which show deterioration and uncontrol. Has been twice to Ed with difficulty breathing, experiencing ACE tickle and cough also  Review of Systems See HPI     Objective:   Physical Exam BP 142/80  Pulse 86  Resp 18  Ht 5\' 6"  (1.676 m)  Wt 196 lb (88.905 kg)  BMI 31.65 kg/m2  SpO2 98% Pleasant well nourished female, alert and oriented x 3, in no cardio-pulmonary distress. Afebrile. HEENT No facial trauma or asymetry. Sinuses non tender.  EOMI, PERTL, fundoscopic exam  no hemorhage or exudate.  External ears normal, tympanic membranes clear. Oropharynx moist, no exudate, good dentition. Neck: supple, no adenopathy,JVD or thyromegaly.No bruits.  Chest: Clear to ascultation bilaterally.No crackles or wheezes. Non tender to palpation  Breast: No asymetry,no masses or lumps. No tenderness. No nipple discharge or inversion. No axillary or supraclavicular adenopathy  Cardiovascular system; Heart sounds normal,  S1 and  S2 ,no S3.  No murmur, or thrill. Apical beat not displaced Peripheral pulses normal.  Abdomen: Soft, non tender, no organomegaly or masses. No bruits. Bowel sounds normal. No guarding, tenderness or rebound.  Rectal:  Normal sphincter tone. No mass.No rectal masses.  Guaiac negative stool.  GU: External genitalia normal female genitalia , female distribution of hair. No lesions. Urethral meatus normal in size, no  Prolapse, no lesions visibly  Present. Bladder non tender. Vagina pink and moist , with no visible lesions , white fishy  discharge present . Adequate pelvic support no  cystocele or rectocele noted Cervix absent ,  Uterus absent, no adnexal masses, no  adnexal tenderness.   Musculoskeletal exam: Full ROM of spine, hips , shoulders and  knees. No deformity ,swelling or crepitus noted. No muscle wasting or atrophy.   Neurologic: Cranial nerves 2 to 12 intact. Power, tone ,sensation and reflexes normal throughout. No disturbance in gait. No tremor.  Skin: Intact, no ulceration, erythema , scaling or rash noted. Pigmentation normal throughout  Psych; Normal mood and affect. Judgement and concentration normal        Assessment & Plan:  Anemia Anemia in pt who has no menses, refer for GI eval  Routine general medical examination at a health care facility Annual exam as documented. Counseling done  re healthy lifestyle involving commitment to 150 minutes exercise per week, heart healthy diet, and attaining healthy weight.The importance of adequate sleep also discussed. Regular seat belt use and safe storage  of firearms if patient has them, is also discussed. Changes in health habits are decided on by the patient with goals and time frames  set for achieving them. Immunization and cancer screening needs are specifically addressed at this visit.   HYPERTENSION Not at goal and likely ACE allergy. Change to maxzide DASH diet and commitment to daily physical activity for a minimum of 30 minutes discussed and encouraged, as a part of hypertension management. The importance of attaining a healthy weight is also discussed.   HYPERLIPIDEMIA Markedly elevated TG, reduce fat intake , no med change at this time  ALLERGIC RHINITIS Change from zyrtec to claritin per pt preference  IMPAIRED FASTING GLUCOSE Deteriorated , HBA1C up to 6.1 Needs lower carb intake and commit to regular exercise  HEADACHE Improved in terms of frequency and severity, fioricet for severe headache,  topamax for prophylaxis

## 2014-07-16 NOTE — Assessment & Plan Note (Signed)
Deteriorated , HBA1C up to 6.1 Needs lower carb intake and commit to regular exercise

## 2014-07-16 NOTE — Assessment & Plan Note (Signed)
Change from zyrtec to claritin per pt preference

## 2014-07-16 NOTE — Assessment & Plan Note (Signed)
Improved in terms of frequency and severity, fioricet for severe headache, topamax for prophylaxis

## 2014-07-16 NOTE — Assessment & Plan Note (Signed)
Annual exam as documented. Counseling done  re healthy lifestyle involving commitment to 150 minutes exercise per week, heart healthy diet, and attaining healthy weight.The importance of adequate sleep also discussed. Regular seat belt use and safe storage  of firearms if patient has them, is also discussed. Changes in health habits are decided on by the patient with goals and time frames  set for achieving them. Immunization and cancer screening needs are specifically addressed at this visit.  

## 2014-07-18 ENCOUNTER — Telehealth: Payer: Self-pay | Admitting: Family Medicine

## 2014-07-18 NOTE — Telephone Encounter (Signed)
Pls see pap result and contact  Patient , and explain per note, she has ASCUS but is HPV negative, needs rept pap in 1 year Mail her a letter to contact the office  If you are unable to spk with her directly by 07/23/2014 pls  Any questions/concerns pls ask

## 2014-07-24 NOTE — Telephone Encounter (Signed)
Called and left message for patient to return call.   Letter sent as well.

## 2014-08-15 ENCOUNTER — Ambulatory Visit: Payer: BC Managed Care – PPO | Admitting: Gastroenterology

## 2014-08-17 ENCOUNTER — Ambulatory Visit (INDEPENDENT_AMBULATORY_CARE_PROVIDER_SITE_OTHER): Payer: BC Managed Care – PPO | Admitting: Gastroenterology

## 2014-08-17 ENCOUNTER — Encounter: Payer: Self-pay | Admitting: Gastroenterology

## 2014-08-17 VITALS — BP 137/85 | HR 82 | Temp 97.4°F | Ht 66.0 in | Wt 195.4 lb

## 2014-08-17 DIAGNOSIS — D649 Anemia, unspecified: Secondary | ICD-10-CM

## 2014-08-17 LAB — IRON AND TIBC
%SAT: 28 % (ref 20–55)
Iron: 98 ug/dL (ref 42–145)
TIBC: 344 ug/dL (ref 250–470)
UIBC: 246 ug/dL (ref 125–400)

## 2014-08-17 LAB — CBC
HEMATOCRIT: 36.2 % (ref 36.0–46.0)
Hemoglobin: 12.7 g/dL (ref 12.0–15.0)
MCH: 32.6 pg (ref 26.0–34.0)
MCHC: 35.1 g/dL (ref 30.0–36.0)
MCV: 92.8 fL (ref 78.0–100.0)
PLATELETS: 366 10*3/uL (ref 150–400)
RBC: 3.9 MIL/uL (ref 3.87–5.11)
RDW: 14.1 % (ref 11.5–15.5)
WBC: 4.6 10*3/uL (ref 4.0–10.5)

## 2014-08-17 MED ORDER — PEG-KCL-NACL-NASULF-NA ASC-C 100 G PO SOLR: 1.0000 | ORAL | Status: DC

## 2014-08-17 NOTE — Progress Notes (Signed)
Referring Provider: Kerri Perches, MD Primary Care Physician:  Syliva Overman, MD Primary Gastroenterologist:  Dr. Darrick Penna   Chief Complaint  Patient presents with  . Anemia    HPI:   Meredith Sanchez is a pleasant 48 year old female presenting today at the request of Dr. Lodema Hong secondary to new onset anemia. Hgb 11.7. Iron and ferritin levels previously normal. Taking iron once daily. Prior use of Ibuprofen for arthritis pain but not routinely. No evidence of overt GI bleeding. No changes in bowel habits, abdominal pain, weight loss, lack of appetite. No upper GI symptoms. No FH of colon cancer. No prior colonoscopy or upper endoscopy.   Past Medical History  Diagnosis Date  . Obesity   . Hyperlipidemia   . Hypertension   . Allergic rhinitis     Past Surgical History  Procedure Laterality Date  . Vesicovaginal fistula closure w/ tah  2003  . Left oophorectomy  2003  . Appendectomy  2003    Current Outpatient Prescriptions  Medication Sig Dispense Refill  . Biotin 1000 MCG tablet Take 5,000 mcg by mouth daily.       . butalbital-acetaminophen-caffeine (FIORICET, ESGIC) 50-325-40 MG per tablet TAKE ONE TABLET BY MOUTH EVERY 6 TO 8 HOURS AS NEEDED FOR SEVERE HEADACHE  30 tablet  4  . cholecalciferol (VITAMIN D) 1000 UNITS tablet Take 1,000 Units by mouth daily.      . ferrous sulfate 325 (65 FE) MG tablet Take 325 mg by mouth daily with breakfast.      . loratadine (CLARITIN) 10 MG tablet Take 1 tablet (10 mg total) by mouth daily.  30 tablet  2  . lovastatin (MEVACOR) 20 MG tablet Take 1 tablet (20 mg total) by mouth daily.  90 tablet  3  . montelukast (SINGULAIR) 10 MG tablet Take 1 tablet (10 mg total) by mouth at bedtime.  30 tablet  3  . topiramate (TOPAMAX) 25 MG tablet Take 1 tablet (25 mg total) by mouth 2 (two) times daily.  60 tablet  5  . triamterene-hydrochlorothiazide (MAXZIDE-25) 37.5-25 MG per tablet Take 1 tablet by mouth daily.  30 tablet  3    No current facility-administered medications for this visit.    Allergies as of 08/17/2014 - Review Complete 08/17/2014  Allergen Reaction Noted  . Ace inhibitors Shortness Of Breath 07/10/2014  . Penicillins  03/23/2011    Family History  Problem Relation Age of Onset  . Diabetes Mother   . Hypertension Mother   . Hypertension Father   . Brain cancer Brother   . Colon cancer Neg Hx     History   Social History  . Marital Status: Married    Spouse Name: N/A    Number of Children: 3  . Years of Education: N/A   Occupational History  . employed      [employer]   Social History Main Topics  . Smoking status: Never Smoker   . Smokeless tobacco: Not on file  . Alcohol Use: No  . Drug Use: No  . Sexual Activity: Not on file   Other Topics Concern  . Not on file   Social History Narrative  . No narrative on file    Review of Systems: As mentioned in HPI  Physical Exam: BP 137/85  Pulse 82  Temp(Src) 97.4 F (36.3 C) (Oral)  Ht  (1.676 m)  Wt 195 lb 6.4 oz (88.633 kg)  BMI 31.55 kg/m2 General:  Alert and oriented. Well-developed, well-nourished, pleasant and cooperative. Head:  Normocephalic and atraumatic. Eyes:  Conjunctiva pink, sclera clear, no icterus.   Conjunctiva pink. Ears:  Normal auditory acuity. Nose:  No deformity, discharge,  or lesions. Mouth:  No deformity or lesions, mucosa pink and moist.  Lungs:  Clear to auscultation bilaterally, without wheezing, rales, or rhonchi.  Heart:  S1, S2 present without murmurs noted.  Abdomen:  +BS, soft, non-tender and non-distended. Without mass or HSM. No rebound or guarding. No hernias noted. Rectal:  Deferred  Msk:  Symmetrical without gross deformities. Normal posture. Extremities:  Without clubbing or edema. Neurologic:  Alert and  oriented x4;  grossly normal neurologically. Skin:  Intact, warm and dry without significant lesions or rashes Psych:  Alert and cooperative. Normal mood and  affect.  Lab Results  Component Value Date   WBC 5.9 07/09/2014   HGB 11.7* 07/09/2014   HCT 33.3* 07/09/2014   MCV 91.7 07/09/2014   PLT 331 07/09/2014   Lab Results  Component Value Date   ALT 14 07/09/2014   AST 15 07/09/2014   ALKPHOS 29* 07/09/2014   BILITOT 0.4 07/09/2014   Lab Results  Component Value Date   CREATININE 0.75 07/09/2014   BUN 15 07/09/2014   NA 138 07/09/2014   K 3.8 07/09/2014   CL 104 07/09/2014   CO2 26 07/09/2014   Lab Results  Component Value Date   IRON 76 07/09/2014   FERRITIN 134 07/09/2014

## 2014-08-17 NOTE — Patient Instructions (Signed)
Please have blood work done today.   We have scheduled you for a colonoscopy with Dr. Darrick Penna in the near future.  Further recommendations to follow!

## 2014-08-17 NOTE — Addendum Note (Signed)
Addended by: Jennings Books on: 08/17/2014 08:55 AM   Modules accepted: Orders

## 2014-08-17 NOTE — Assessment & Plan Note (Signed)
48 year old female with non-specific new onset mild anemia, with iron/ferritin normal. No overt signs of GI bleeding, heme negative, no concerning GI symptoms. With age and ethnicity, due for routine screening colonoscopy at this time regardless. Will recheck CBC, iron, ferritin, TIBC, and proceed with colonoscopy with Dr. Darrick Penna. Risks and benefits discussed in detail with stated understanding.

## 2014-08-18 LAB — FERRITIN: FERRITIN: 348 ng/mL — AB (ref 10–291)

## 2014-08-20 ENCOUNTER — Encounter (HOSPITAL_COMMUNITY)
Admission: RE | Disposition: A | Discharge status: Home / Self Care | Payer: Self-pay | Source: Ambulatory Visit | Attending: Gastroenterology

## 2014-08-20 ENCOUNTER — Encounter (HOSPITAL_COMMUNITY): Payer: Self-pay | Admitting: *Deleted

## 2014-08-20 ENCOUNTER — Ambulatory Visit (HOSPITAL_COMMUNITY)
Admission: RE | Admit: 2014-08-20 | Discharge: 2014-08-20 | Disposition: A | Discharge status: Home / Self Care | Payer: BC Managed Care – PPO | Source: Ambulatory Visit | Attending: Gastroenterology | Admitting: Gastroenterology

## 2014-08-20 DIAGNOSIS — E785 Hyperlipidemia, unspecified: Secondary | ICD-10-CM | POA: Diagnosis not present

## 2014-08-20 DIAGNOSIS — D649 Anemia, unspecified: Secondary | ICD-10-CM

## 2014-08-20 DIAGNOSIS — Z79899 Other long term (current) drug therapy: Secondary | ICD-10-CM | POA: Diagnosis not present

## 2014-08-20 DIAGNOSIS — E669 Obesity, unspecified: Secondary | ICD-10-CM | POA: Insufficient documentation

## 2014-08-20 DIAGNOSIS — K648 Other hemorrhoids: Secondary | ICD-10-CM | POA: Insufficient documentation

## 2014-08-20 DIAGNOSIS — I10 Essential (primary) hypertension: Secondary | ICD-10-CM | POA: Diagnosis not present

## 2014-08-20 DIAGNOSIS — Z1211 Encounter for screening for malignant neoplasm of colon: Secondary | ICD-10-CM | POA: Diagnosis present

## 2014-08-20 HISTORY — PX: COLONOSCOPY: SHX5424

## 2014-08-20 SURGERY — COLONOSCOPY
Anesthesia: Moderate Sedation

## 2014-08-20 MED ORDER — MIDAZOLAM HCL 5 MG/5ML IJ SOLN
INTRAMUSCULAR | Status: AC
  Filled 2014-08-20: qty 10

## 2014-08-20 MED ORDER — MIDAZOLAM HCL 5 MG/5ML IJ SOLN
INTRAMUSCULAR | Status: DC | PRN
  Administered 2014-08-20: 2 mg via INTRAVENOUS
  Administered 2014-08-20: 1 mg via INTRAVENOUS
  Administered 2014-08-20: 2 mg via INTRAVENOUS
  Administered 2014-08-20: 1 mg via INTRAVENOUS

## 2014-08-20 MED ORDER — HYDROCORTISONE ACE-PRAMOXINE 1-1 % RE CREA: TOPICAL_CREAM | RECTAL | Status: DC

## 2014-08-20 MED ORDER — STERILE WATER FOR IRRIGATION IR SOLN
Status: DC | PRN
  Administered 2014-08-20: 14:00:00

## 2014-08-20 MED ORDER — SODIUM CHLORIDE 0.9 % IV SOLN
Freq: Once | INTRAVENOUS | Status: AC
  Administered 2014-08-20: 14:00:00 via INTRAVENOUS

## 2014-08-20 MED ORDER — MEPERIDINE HCL 100 MG/ML IJ SOLN
INTRAMUSCULAR | Status: DC | PRN
  Administered 2014-08-20: 50 mg via INTRAVENOUS
  Administered 2014-08-20: 25 mg via INTRAVENOUS
  Administered 2014-08-20: 50 mg via INTRAVENOUS

## 2014-08-20 MED ORDER — MEPERIDINE HCL 100 MG/ML IJ SOLN
INTRAMUSCULAR | Status: AC
  Filled 2014-08-20: qty 2

## 2014-08-20 NOTE — Progress Notes (Signed)
REVIEWED-NO ADDITIONAL RECOMMENDATIONS. 

## 2014-08-20 NOTE — Progress Notes (Signed)
cc'ed to pcp °

## 2014-08-20 NOTE — Discharge Instructions (Signed)
You have internal hemorrhoids. YOU DID NOT HAVE ANY POLYPS.   FOLLOW A HIGH FIBER DIET. AVOID ITEMS THAT CAUSE BLOATING. SEE INFO BELOW.  USE PROCTO-CREAM 2 TO 4 TIMES A DAY FOR 10 DAYS TO RELIEVE RECTAL PRESSURE.  Next colonoscopy in 10 years.    Colonoscopy Care After Read the instructions outlined below and refer to this sheet in the next week. These discharge instructions provide you with general information on caring for yourself after you leave the hospital. While your treatment has been planned according to the most current medical practices available, unavoidable complications occasionally occur. If you have any problems or questions after discharge, call DR. Kais Monje, [phone].  ACTIVITY  You may resume your regular activity, but move at a slower pace for the next 24 hours.   Take frequent rest periods for the next 24 hours.   Walking will help get rid of the air and reduce the bloated feeling in your belly (abdomen).   No driving for 24 hours (because of the medicine (anesthesia) used during the test).   You may shower.   Do not sign any important legal documents or operate any machinery for 24 hours (because of the anesthesia used during the test).    NUTRITION  Drink plenty of fluids.   You may resume your normal diet as instructed by your doctor.   Begin with a light meal and progress to your normal diet. Heavy or fried foods are harder to digest and may make you feel sick to your stomach (nauseated).   Avoid alcoholic beverages for 24 hours or as instructed.    MEDICATIONS  You may resume your normal medications.   WHAT YOU CAN EXPECT TODAY  Some feelings of bloating in the abdomen.   Passage of more gas than usual.   Spotting of blood in your stool or on the toilet paper  .  IF YOU HAD POLYPS REMOVED DURING THE COLONOSCOPY:  Eat a soft diet IF YOU HAVE NAUSEA, BLOATING, ABDOMINAL PAIN, OR VOMITING.    FINDING OUT THE RESULTS OF YOUR  TEST Not all test results are available during your visit. DR. Darrick Penna WILL CALL YOU WITHIN 7 DAYS OF YOUR PROCEDUE WITH YOUR RESULTS. Do not assume everything is normal if you have not heard from DR. Jameah Rouser IN ONE WEEK, CALL HER OFFICE AT [phone].  SEEK IMMEDIATE MEDICAL ATTENTION AND CALL THE OFFICE: [phone] IF:  You have more than a spotting of blood in your stool.   Your belly is swollen (abdominal distention).   You are nauseated or vomiting.   You have a temperature over 101F.   You have abdominal pain or discomfort that is severe or gets worse throughout the day.  High-Fiber Diet A high-fiber diet changes your normal diet to include more whole grains, legumes, fruits, and vegetables. Changes in the diet involve replacing refined carbohydrates with unrefined foods. The calorie level of the diet is essentially unchanged. The Dietary Reference Intake (recommended amount) for adult males is 38 grams per day. For adult females, it is 25 grams per day. Pregnant and lactating women should consume 28 grams of fiber per day. Fiber is the intact part of a plant that is not broken down during digestion. Functional fiber is fiber that has been isolated from the plant to provide a beneficial effect in the body. PURPOSE  Increase stool bulk.   Ease and regulate bowel movements.   Lower cholesterol.  INDICATIONS THAT YOU NEED MORE FIBER  Constipation and hemorrhoids.  Uncomplicated diverticulosis (intestine condition) and irritable bowel syndrome.   Weight management.   As a protective measure against hardening of the arteries (atherosclerosis), diabetes, and cancer.   GUIDELINES FOR INCREASING FIBER IN THE DIET  Start adding fiber to the diet slowly. A gradual increase of about 5 more grams (2 slices of whole-wheat bread, 2 servings of most fruits or vegetables, or 1 bowl of high-fiber cereal) per day is best. Too rapid an increase in fiber may result in constipation,  flatulence, and bloating.   Drink enough water and fluids to keep your urine clear or pale yellow. Water, juice, or caffeine-free drinks are recommended. Not drinking enough fluid may cause constipation.   Eat a variety of high-fiber foods rather than one type of fiber.   Try to increase your intake of fiber through using high-fiber foods rather than fiber pills or supplements that contain small amounts of fiber.   The goal is to change the types of food eaten. Do not supplement your present diet with high-fiber foods, but replace foods in your present diet.  INCLUDE A VARIETY OF FIBER SOURCES  Replace refined and processed grains with whole grains, canned fruits with fresh fruits, and incorporate other fiber sources. White rice, white breads, and most bakery goods contain little or no fiber.   Liebert whole-grain rice, buckwheat oats, and many fruits and vegetables are all good sources of fiber. These include: broccoli, Brussels sprouts, cabbage, cauliflower, beets, sweet potatoes, white potatoes (skin on), carrots, tomatoes, eggplant, squash, berries, fresh fruits, and dried fruits.   Cereals appear to be the richest source of fiber. Cereal fiber is found in whole grains and bran. Bran is the fiber-rich outer coat of cereal grain, which is largely removed in refining. In whole-grain cereals, the bran remains. In breakfast cereals, the largest amount of fiber is found in those with "bran" in their names. The fiber content is sometimes indicated on the label.   You may need to include additional fruits and vegetables each day.   In baking, for 1 cup white flour, you may use the following substitutions:   1 cup whole-wheat flour minus 2 tablespoons.   1/2 cup white flour plus 1/2 cup whole-wheat flour.   Hemorrhoids Hemorrhoids are dilated (enlarged) veins around the rectum. Sometimes clots will form in the veins. This makes them swollen and painful. These are called thrombosed  hemorrhoids. Causes of hemorrhoids include:  Constipation.   Straining to have a bowel movement.   HEAVY LIFTING HOME CARE INSTRUCTIONS  Eat a well balanced diet and drink 6 to 8 glasses of water every day to avoid constipation. You may also use a bulk laxative.   Avoid straining to have bowel movements.   Keep anal area dry and clean.   Do not use a donut shaped pillow or sit on the toilet for long periods. This increases blood pooling and pain.   Move your bowels when your body has the urge; this will require less straining and will decrease pain and pressure.

## 2014-08-20 NOTE — H&P (Signed)
  Primary Care Physician:  Tula Nakayama, MD Primary Gastroenterologist:  Dr. Oneida Alar  Pre-Procedure History & Physical: HPI:  Meredith Sanchez is a 48 y.o. female here for  ANEMIA.  Past Medical History  Diagnosis Date  . Obesity   . Hyperlipidemia   . Hypertension   . Allergic rhinitis     Past Surgical History  Procedure Laterality Date  . Vesicovaginal fistula closure w/ tah  2003  . Left oophorectomy  2003  . Appendectomy  2003    Prior to Admission medications   Medication Sig Start Date End Date Taking? Authorizing Provider  Biotin 1000 MCG tablet Take 5,000 mcg by mouth daily.    Yes Historical Provider, MD  butalbital-acetaminophen-caffeine (FIORICET, ESGIC) 50-325-40 MG per tablet TAKE ONE TABLET BY MOUTH EVERY 6 TO 8 HOURS AS NEEDED FOR SEVERE HEADACHE 07/11/14  Yes Fayrene Helper, MD  cholecalciferol (VITAMIN D) 1000 UNITS tablet Take 1,000 Units by mouth daily.   Yes Historical Provider, MD  ferrous sulfate 325 (65 FE) MG tablet Take 325 mg by mouth daily with breakfast.   Yes Historical Provider, MD  loratadine (CLARITIN) 10 MG tablet Take 1 tablet (10 mg total) by mouth daily. 09/25/13  Yes Fayrene Helper, MD  lovastatin (MEVACOR) 20 MG tablet Take 1 tablet (20 mg total) by mouth daily. 09/25/13 09/25/14 Yes Fayrene Helper, MD  montelukast (SINGULAIR) 10 MG tablet Take 1 tablet (10 mg total) by mouth at bedtime. 07/11/14  Yes Fayrene Helper, MD  peg 3350 powder (MOVIPREP) 100 G SOLR Take 1 kit (200 g total) by mouth as directed. 08/17/14  Yes Danie Binder, MD  topiramate (TOPAMAX) 25 MG tablet Take 1 tablet (25 mg total) by mouth 2 (two) times daily. 09/25/13 09/25/14 Yes Fayrene Helper, MD  triamterene-hydrochlorothiazide (MAXZIDE-25) 37.5-25 MG per tablet Take 1 tablet by mouth daily. 07/11/14  Yes Fayrene Helper, MD    Allergies as of 08/17/2014 - Review Complete 08/17/2014  Allergen Reaction Noted  . Ace inhibitors Shortness Of Breath  07/10/2014  . Penicillins  03/23/2011    Family History  Problem Relation Age of Onset  . Diabetes Mother   . Hypertension Mother   . Hypertension Father   . Brain cancer Brother   . Colon cancer Neg Hx     History   Social History  . Marital Status: Married    Spouse Name: N/A    Number of Children: 3  . Years of Education: N/A   Occupational History  . employed      [employer]   Social History Main Topics  . Smoking status: Never Smoker   . Smokeless tobacco: Not on file  . Alcohol Use: No  . Drug Use: No  . Sexual Activity: Not on file   Other Topics Concern  . Not on file   Social History Narrative  . No narrative on file    Review of Systems: See HPI, otherwise negative ROS   Physical Exam: BP 131/86  Pulse 81  SpO2 98% General:   Alert,  pleasant and cooperative in NAD Head:  Normocephalic and atraumatic. Neck:  Supple; Lungs:  Clear throughout to auscultation.    Heart:  Regular rate and rhythm. Abdomen:  Soft, nontender and nondistended. Normal bowel sounds, without guarding, and without rebound.   Neurologic:  Alert and  oriented x4;  grossly normal neurologically.  Impression/Plan:    Anemia  PLAN:  1. TCS TODAY

## 2014-08-21 NOTE — Op Note (Addendum)
[facility] [address] [place], [address]   COLONOSCOPY PROCEDURE REPORT     EXAM DATE: 08/20/2014  PATIENT NAME:      Meredith Sanchez, Meredith Sanchez           MR #:      [id number]  BIRTHDATE:       24-Mar-1966      VISIT #:     [id number] ATTENDING:     Jonette Eva, MD     STATUS:     outpatient ASSISTANT:  INDICATIONS:  The patient is a 48 yr old female here for a colonoscopy due to average risk for colon cancer. PROCEDURE PERFORMED:     Colonoscopy, screening MEDICATIONS:     Meperidine (Demerol) 125 mg IV and Versed 6 mg IV  ESTIMATED BLOOD LOSS:     None  CONSENT: The patient understands the risks and benefits of the procedure and understands that these risks include, but are not limited to: sedation, allergic reaction, infection, perforation and/or bleeding. Alternative means of evaluation and treatment include, among others: physical exam, x-rays, and/or surgical intervention. The patient elects to proceed with this endoscopic procedure. DESCRIPTION OF PROCEDURE: During intra-op preparation period all mechanical & medical equipment was checked for proper function. Hand hygiene and appropriate measures for infection prevention was taken. After the risks, benefits and alternatives of the procedure were thoroughly explained, Informed consent was verified, confirmed and timeout was successfully executed by the treatment team. A digital exam revealed no abnormalities of the rectum.      The    endoscope was introduced through the anus and advanced to the cecum, which was identified by both the appendix and ileocecal valve. The prep was good.. The instrument was then slowly withdrawn as the colon was fully examined.  COLON FINDINGS: A normal appearing cecum, ileocecal valve, and appendiceal orifice were identified.  The ascending, transverse, descending, sigmoid colon, and rectum appeared unremarkable.   The colon was redundant.  Manual abdominal counter-pressure was  used to reach the cecum.   Small internal hemorrhoids were found. Retroflexed views revealed internal hemorrhoids.  The scope was then completely withdrawn from the patient and the procedure terminated.    ADVERSE EVENTS:      There were no complications.   CECAL WITHDRAWAL TIME: 17 MINS IMPRESSIONS:     Internal hemorrhoids  RECOMMENDATIONS:     FOLLOW A HIGH FIBER DIET.  AVOID ITEMS THAT CAUSE BLOATING. USE PROCTO-CREAM 2 TO 4 TIMES A DAY FOR 10 DAYS TO RELIEVE RECTAL PRESSURE.  Next colonoscopy in 10 years WITH AN OVERTUBE RECALL:  _____________________________ Jonette Eva, MD eSigned:  Jonette Eva, MD 08/21/2014 9:00 PM Revised: 08/21/2014 9:00 PM  cc:  Syliva Overman, M.D.  CPT CODES: ICD9 CODES:  The ICD and CPT codes recommended by this software are interpretations from the data that the clinical staff has captured with the software.  The verification of the translation of this report to the ICD and CPT codes and modifiers is the sole responsibility of the health care institution and practicing physician where this report was generated.  [employer] will not be held responsible for the validity of the ICD and CPT codes included on this report.  AMA assumes no liability for data contained or not contained herein. CPT is a Publishing rights manager of the [employer].

## 2014-08-23 ENCOUNTER — Other Ambulatory Visit: Payer: Self-pay

## 2014-08-23 DIAGNOSIS — D649 Anemia, unspecified: Secondary | ICD-10-CM

## 2014-08-23 NOTE — Progress Notes (Signed)
Quick Note:  I called and informed pt. She said that Tobi Bastos told her to stop taking iron. She just wants to confirm that is correct. ______

## 2014-08-23 NOTE — Progress Notes (Signed)
Quick Note:  Ferritin slightly elevated. Hgb normal. Anemia non-specific. No concern for IDA. Recheck ferritin in 3 months. ______

## 2014-08-24 ENCOUNTER — Encounter (HOSPITAL_COMMUNITY): Payer: Self-pay | Admitting: Gastroenterology

## 2014-08-27 NOTE — Progress Notes (Signed)
Quick Note:  Yes may stop for now. ______

## 2014-08-28 NOTE — Progress Notes (Signed)
Quick Note:  Pt is aware and lab order on file for 10/2014. ______

## 2014-09-17 ENCOUNTER — Ambulatory Visit: Payer: BC Managed Care – PPO | Admitting: Family Medicine

## 2014-10-16 ENCOUNTER — Encounter: Payer: Self-pay | Admitting: *Deleted

## 2014-10-28 ENCOUNTER — Other Ambulatory Visit: Payer: Self-pay | Admitting: Family Medicine

## 2014-12-10 ENCOUNTER — Other Ambulatory Visit: Payer: Self-pay | Admitting: Family Medicine

## 2015-02-04 ENCOUNTER — Encounter: Payer: Self-pay | Admitting: Family Medicine

## 2015-02-18 ENCOUNTER — Other Ambulatory Visit: Payer: Self-pay | Admitting: Family Medicine

## 2015-04-02 ENCOUNTER — Telehealth: Payer: Self-pay | Admitting: *Deleted

## 2015-04-02 NOTE — Telephone Encounter (Signed)
Pt called stating she has been on her blood pressure medication for a few weeks and it is still not helping her, pt said that her blood pressure is still running around  151/92, pt states she is supposed to take 1 a day but she increased it to 1 and a half a day. Please advise

## 2015-04-02 NOTE — Telephone Encounter (Signed)
Patient not seen since 06/2014.  Will need ov for blood pressure eval.

## 2015-04-02 NOTE — Telephone Encounter (Signed)
Called and left message for patient to call back and schedule appt

## 2015-04-04 ENCOUNTER — Encounter: Payer: Self-pay | Admitting: Family Medicine

## 2015-04-04 ENCOUNTER — Ambulatory Visit (INDEPENDENT_AMBULATORY_CARE_PROVIDER_SITE_OTHER): Payer: 59 | Admitting: Family Medicine

## 2015-04-04 VITALS — BP 124/82 | HR 70 | Resp 16 | Ht 66.0 in | Wt 195.0 lb

## 2015-04-04 DIAGNOSIS — J3089 Other allergic rhinitis: Secondary | ICD-10-CM

## 2015-04-04 DIAGNOSIS — M542 Cervicalgia: Secondary | ICD-10-CM

## 2015-04-04 DIAGNOSIS — R7301 Impaired fasting glucose: Secondary | ICD-10-CM | POA: Diagnosis not present

## 2015-04-04 DIAGNOSIS — E559 Vitamin D deficiency, unspecified: Secondary | ICD-10-CM

## 2015-04-04 DIAGNOSIS — D539 Nutritional anemia, unspecified: Secondary | ICD-10-CM

## 2015-04-04 DIAGNOSIS — I10 Essential (primary) hypertension: Secondary | ICD-10-CM

## 2015-04-04 DIAGNOSIS — E785 Hyperlipidemia, unspecified: Secondary | ICD-10-CM | POA: Diagnosis not present

## 2015-04-04 DIAGNOSIS — Z1159 Encounter for screening for other viral diseases: Secondary | ICD-10-CM

## 2015-04-04 DIAGNOSIS — E669 Obesity, unspecified: Secondary | ICD-10-CM

## 2015-04-04 DIAGNOSIS — G43009 Migraine without aura, not intractable, without status migrainosus: Secondary | ICD-10-CM

## 2015-04-04 DIAGNOSIS — M79601 Pain in right arm: Secondary | ICD-10-CM | POA: Diagnosis not present

## 2015-04-04 MED ORDER — TRIAMTERENE-HCTZ 37.5-25 MG PO TABS: ORAL_TABLET | ORAL | Status: DC

## 2015-04-04 MED ORDER — METHYLPREDNISOLONE ACETATE 80 MG/ML IJ SUSP
80.0000 mg | Freq: Once | INTRAMUSCULAR | Status: AC
  Administered 2015-04-04: 80 mg via INTRAMUSCULAR

## 2015-04-04 MED ORDER — PREDNISONE 5 MG PO TABS: 5.0000 mg | ORAL_TABLET | Freq: Two times a day (BID) | ORAL | Status: DC

## 2015-04-04 NOTE — Assessment & Plan Note (Addendum)
On avg hasone i headache per month, alternates with excedrin migriane , two tabs, and fioricet one tablet aborts,  medcation refilled appropriately

## 2015-04-04 NOTE — Assessment & Plan Note (Signed)
Controlled, no change in medication DASH diet and commitment to daily physical activity for a minimum of 30 minutes discussed and encouraged, as a part of hypertension management. The importance of attaining a healthy weight is also discussed.  BP/Weight 04/04/2015 08/20/2014 08/17/2014 07/10/2014 09/25/2013 03/13/2013 12/23/2012  Systolic BP 124 117 137 142 138 122 -  Diastolic BP 82 80 85 80 82 76 -  Wt. (Lbs) 195 - 195.4 196 197.12 193.04 191  BMI 31.49 - 31.55 31.65 31.83 31.17 30.84

## 2015-04-04 NOTE — Patient Instructions (Addendum)
CPE end August,call if you need me before   It is important that you exercise regularly at least 30 minutes 7 times a week. If you develop chest pain, have severe difficulty breathing, or feel very tired, stop exercising immediately and seek medical attention   A healthy diet is rich in fruit, vegetables and whole grains. Poultry fish, nuts and beans are a healthy choice for protein rather then red meat. A low sodium diet and drinking 64 ounces of water daily is generally recommended. Oils and sweet should be limited. Carbohydrates especially for those who are diabetic or overweight, should be limited to 60-45 gram per meal. It is important to eat on a regular schedule, at least 3 times daily. Snacks should be primarily fruits, vegetables or nuts.   BP is excellent stay on same dose med  A lot of nutrition ed will be provided today   Fasting labs this week

## 2015-04-06 LAB — CBC WITH DIFFERENTIAL/PLATELET
BASOS PCT: 0 % (ref 0–1)
Basophils Absolute: 0 10*3/uL (ref 0.0–0.1)
Eosinophils Absolute: 0 10*3/uL (ref 0.0–0.7)
Eosinophils Relative: 0 % (ref 0–5)
HCT: 36.7 % (ref 36.0–46.0)
HEMOGLOBIN: 12.9 g/dL (ref 12.0–15.0)
LYMPHS ABS: 1.6 10*3/uL (ref 0.7–4.0)
Lymphocytes Relative: 19 % (ref 12–46)
MCH: 32.9 pg (ref 26.0–34.0)
MCHC: 35.1 g/dL (ref 30.0–36.0)
MCV: 93.6 fL (ref 78.0–100.0)
MPV: 9.6 fL (ref 8.6–12.4)
Monocytes Absolute: 0.7 10*3/uL (ref 0.1–1.0)
Monocytes Relative: 8 % (ref 3–12)
Neutro Abs: 6 10*3/uL (ref 1.7–7.7)
Neutrophils Relative %: 73 % (ref 43–77)
Platelets: 372 10*3/uL (ref 150–400)
RBC: 3.92 MIL/uL (ref 3.87–5.11)
RDW: 14.5 % (ref 11.5–15.5)
WBC: 8.2 10*3/uL (ref 4.0–10.5)

## 2015-04-06 LAB — COMPLETE METABOLIC PANEL WITH GFR
ALBUMIN: 4.8 g/dL (ref 3.5–5.2)
ALT: 16 U/L (ref 0–35)
AST: 17 U/L (ref 0–37)
Alkaline Phosphatase: 38 U/L — ABNORMAL LOW (ref 39–117)
BILIRUBIN TOTAL: 0.7 mg/dL (ref 0.2–1.2)
BUN: 22 mg/dL (ref 6–23)
CHLORIDE: 98 meq/L (ref 96–112)
CO2: 27 mEq/L (ref 19–32)
Calcium: 10.6 mg/dL — ABNORMAL HIGH (ref 8.4–10.5)
Creat: 1.09 mg/dL (ref 0.50–1.10)
GFR, EST AFRICAN AMERICAN: 69 mL/min
GFR, Est Non African American: 60 mL/min
Glucose, Bld: 88 mg/dL (ref 70–99)
Potassium: 4.1 mEq/L (ref 3.5–5.3)
Sodium: 136 mEq/L (ref 135–145)
Total Protein: 8.3 g/dL (ref 6.0–8.3)

## 2015-04-06 LAB — LIPID PANEL
CHOLESTEROL: 202 mg/dL — AB (ref 0–200)
HDL: 53 mg/dL (ref >=46)
LDL CALC: 108 mg/dL — AB (ref 0–99)
Total CHOL/HDL Ratio: 3.8 Ratio
Triglycerides: 205 mg/dL — ABNORMAL HIGH (ref <150)
VLDL: 41 mg/dL — ABNORMAL HIGH (ref 0–40)

## 2015-04-07 LAB — HIV ANTIBODY (ROUTINE TESTING W REFLEX): HIV: NONREACTIVE

## 2015-04-07 LAB — TSH: TSH: 1.134 u[IU]/mL (ref 0.350–4.500)

## 2015-04-07 LAB — HEMOGLOBIN A1C
Hgb A1c MFr Bld: 6.2 % — ABNORMAL HIGH (ref <5.7)
Mean Plasma Glucose: 131 mg/dL — ABNORMAL HIGH (ref <117)

## 2015-04-08 LAB — VITAMIN D 25 HYDROXY (VIT D DEFICIENCY, FRACTURES): VIT D 25 HYDROXY: 35 ng/mL (ref 30–100)

## 2015-04-13 DIAGNOSIS — M542 Cervicalgia: Secondary | ICD-10-CM | POA: Insufficient documentation

## 2015-04-13 NOTE — Assessment & Plan Note (Signed)
Deteriorated Patient educated about the importance of limiting  Carbohydrate intake , the need to commit to daily physical activity for a minimum of 30 minutes , and to commit weight loss. The fact that changes in all these areas will reduce or eliminate all together the development of diabetes is stressed.   Diabetic Labs Latest Ref Rng 04/04/2015 07/09/2014 09/20/2013 03/10/2013 09/01/2012  HbA1c <5.7 % 6.2(H) 6.1(H) 5.8(H) 5.8(H) 5.9(H)  Chol 0 - 200 mg/dL 960(A202(H) 540173 [id number]  HDL >=46 mg/dL 53 48 46 45 45  Calc LDL 0 - 99 mg/dL 295(A108(H) 80 213(Y111(H) 99 91  Triglycerides <150 mg/dL 865(H205(H) 846(N226(H) 629(B152(H) 284(X157(H) 220(H)  Creatinine 0.50 - 1.10 mg/dL 3.241.09 4.010.75 0.270.71 2.530.81 6.640.75   BP/Weight 04/04/2015 08/20/2014 08/17/2014 07/10/2014 09/25/2013 03/13/2013 12/23/2012  Systolic BP 124 117 137 142 138 122 -  Diastolic BP 82 80 85 80 82 76 -  Wt. (Lbs) 195 - 195.4 196 197.12 193.04 191  BMI 31.49 - 31.55 31.65 31.83 31.17 30.84   No flowsheet data found.

## 2015-04-13 NOTE — Assessment & Plan Note (Signed)
3 week h/o neck pain radiating to right wrist which is uncontrolled, requests injections , denies upper extremity weakness or numbness

## 2015-04-13 NOTE — Assessment & Plan Note (Signed)
Unchanged Patient re-educated about  the importance of commitment to a  minimum of 150 minutes of exercise per week.  The importance of healthy food choices with portion control discussed. Encouraged to start a food diary, count calories and to consider  joining a support group. Sample diet sheets offered. Goals set by the patient for the next several months.   Weight /BMI 04/04/2015 08/17/2014 07/10/2014  WEIGHT 195 lb 195 lb 6.4 oz 196 lb  HEIGHT 5\' 6"  5\' 6"  5\' 6"   BMI 31.49 kg/m2 31.55 kg/m2 31.65 kg/m2    Current exercise per week 90 minutes.

## 2015-04-13 NOTE — Assessment & Plan Note (Signed)
Deteriorated Hyperlipidemia:Low fat diet discussed and encouraged.   Lipid Panel  Lab Results  Component Value Date   CHOL 202* 04/04/2015   HDL 53 04/04/2015   LDLCALC 108* 04/04/2015   TRIG 205* 04/04/2015   CHOLHDL 3.8 04/04/2015

## 2015-04-13 NOTE — Assessment & Plan Note (Signed)
Controlled, no change in medication  

## 2015-04-13 NOTE — Progress Notes (Signed)
Meredith Sanchez     MRN: [id number]      DOB: 08/28/1966   HPI Meredith Sanchez is here for follow up and re-evaluation of chronic medical conditions, medication management and review of any available recent lab and radiology data.  Preventive health is updated, specifically  Cancer screening and Immunization.    The PT denies any adverse reactions to current medications since the last visit.  Finally able to return for BP re evaluation on her new medication , this is good. 3 week h/o neck pain radiating to right hand, no aggravating or inciting factors known. She denies any upper extremity weakness or numbness  ROS Denies recent fever or chills. Denies sinus pressure, nasal congestion, ear pain or sore throat. Denies chest congestion, productive cough or wheezing. Denies chest pains, palpitations and leg swelling Denies abdominal pain, nausea, vomiting,diarrhea or constipation.   Denies dysuria, frequency, hesitancy or incontinence.  Denies headaches, seizures, numbness, or tingling. Denies depression, anxiety or insomnia. Denies skin break down or rash.   PE  BP 124/82 mmHg  Pulse 70  Resp 16  Ht 5\' 6"  (1.676 m)  Wt 195 lb (88.451 kg)  BMI 31.49 kg/m2  SpO2 97%  Patient alert and oriented and in no cardiopulmonary distress.  HEENT: No facial asymmetry, EOMI,   oropharynx pink and moist.  Neck decreased ROM c spine with right trapezius spasm, no JVD, no mass.  Chest: Clear to auscultation bilaterally.  CVS: S1, S2 no murmurs, no S3.Regular rate.  ABD: Soft non tender.   Ext: No edema  MS: Adequate ROM lumbar  spine,  hips and knees.  Skin: Intact, no ulcerations or rash noted.  Psych: Good eye contact, normal affect. Memory intact not anxious or depressed appearing.  CNS: CN 2-12 intact, power,  normal throughout.no focal deficits noted.   Assessment & Plan   Essential hypertension Controlled, no change in medication DASH diet and commitment to daily physical  activity for a minimum of 30 minutes discussed and encouraged, as a part of hypertension management. The importance of attaining a healthy weight is also discussed.  BP/Weight 04/04/2015 08/20/2014 08/17/2014 07/10/2014 09/25/2013 03/13/2013 12/23/2012  Systolic BP 124 117 137 142 138 122 -  Diastolic BP 82 80 85 80 82 76 -  Wt. (Lbs) 195 - 195.4 196 197.12 193.04 191  BMI 31.49 - 31.55 31.65 31.83 31.17 30.84         Migraine headache On avg hasone i headache per month, alternates with excedrin migriane , two tabs, and fioricet one tablet aborts,  medcation refilled appropriately   IMPAIRED FASTING GLUCOSE Deteriorated Patient educated about the importance of limiting  Carbohydrate intake , the need to commit to daily physical activity for a minimum of 30 minutes , and to commit weight loss. The fact that changes in all these areas will reduce or eliminate all together the development of diabetes is stressed.   Diabetic Labs Latest Ref Rng 04/04/2015 07/09/2014 09/20/2013 03/10/2013 09/01/2012  HbA1c <5.7 % 6.2(H) 6.1(H) 5.8(H) 5.8(H) 5.9(H)  Chol 0 - 200 mg/dL 409(W202(H) 119173 147187 829175 562180  HDL >=46 mg/dL 53 48 46 45 45  Calc LDL 0 - 99 mg/dL 130(Q108(H) 80 657(Q111(H) 99 91  Triglycerides <150 mg/dL 469(G205(H) 295(M226(H) 841(L152(H) 244(W157(H) 220(H)  Creatinine 0.50 - 1.10 mg/dL 1.021.09 7.250.75 3.660.71 4.400.81 3.470.75   BP/Weight 04/04/2015 08/20/2014 08/17/2014 07/10/2014 09/25/2013 03/13/2013 12/23/2012  Systolic BP 124 117 137 142 138 122 -  Diastolic BP 82 80 85 80  82 76 -  Wt. (Lbs) 195 - 195.4 196 197.12 193.04 191  BMI 31.49 - 31.55 31.65 31.83 31.17 30.84   No flowsheet data found.      Hyperlipidemia LDL goal <100 Deteriorated Hyperlipidemia:Low fat diet discussed and encouraged.   Lipid Panel  Lab Results  Component Value Date   CHOL 202* 04/04/2015   HDL 53 04/04/2015   LDLCALC 108* 04/04/2015   TRIG 205* 04/04/2015   CHOLHDL 3.8 04/04/2015         Obesity Unchanged Patient re-educated about  the  importance of commitment to a  minimum of 150 minutes of exercise per week.  The importance of healthy food choices with portion control discussed. Encouraged to start a food diary, count calories and to consider  joining a support group. Sample diet sheets offered. Goals set by the patient for the next several months.   Weight /BMI 04/04/2015 08/17/2014 07/10/2014  WEIGHT 195 lb 195 lb 6.4 oz 196 lb  HEIGHT 5\' 6"  5\' 6"  5\' 6"   BMI 31.49 kg/m2 31.55 kg/m2 31.65 kg/m2    Current exercise per week 90 minutes.    Allergic rhinitis Controlled, no change in medication    Neck pain on right side 3 week h/o neck pain radiating to right wrist which is uncontrolled, requests injections , denies upper extremity weakness or numbness

## 2015-07-16 ENCOUNTER — Encounter: Payer: Self-pay | Admitting: Family Medicine

## 2015-08-26 ENCOUNTER — Other Ambulatory Visit: Payer: Self-pay | Admitting: Family Medicine

## 2015-10-20 ENCOUNTER — Other Ambulatory Visit: Payer: Self-pay | Admitting: Gastroenterology

## 2015-11-04 ENCOUNTER — Other Ambulatory Visit: Payer: Self-pay | Admitting: Family Medicine

## 2015-11-26 ENCOUNTER — Telehealth: Payer: Self-pay | Admitting: Family Medicine

## 2015-11-26 DIAGNOSIS — I10 Essential (primary) hypertension: Secondary | ICD-10-CM

## 2015-11-26 DIAGNOSIS — R7301 Impaired fasting glucose: Secondary | ICD-10-CM

## 2015-11-26 DIAGNOSIS — E785 Hyperlipidemia, unspecified: Secondary | ICD-10-CM

## 2015-11-26 NOTE — Telephone Encounter (Signed)
Patient is asking to send lab orders to the lab so she can get labs drawn before appointment Wednesday

## 2015-11-26 NOTE — Telephone Encounter (Signed)
Labs sent in

## 2015-11-27 LAB — HEMOGLOBIN A1C
HEMOGLOBIN A1C: 5.8 % — AB (ref <5.7)
MEAN PLASMA GLUCOSE: 120 mg/dL — AB (ref <117)

## 2015-11-27 LAB — COMPLETE METABOLIC PANEL WITH GFR
ALT: 27 U/L (ref 6–29)
AST: 28 U/L (ref 10–35)
Albumin: 4.7 g/dL (ref 3.6–5.1)
Alkaline Phosphatase: 40 U/L (ref 33–130)
BUN: 22 mg/dL (ref 7–25)
CALCIUM: 10.3 mg/dL (ref 8.6–10.4)
CHLORIDE: 98 mmol/L (ref 98–110)
CO2: 25 mmol/L (ref 20–31)
Creat: 1.13 mg/dL — ABNORMAL HIGH (ref 0.50–1.05)
GFR, Est African American: 65 mL/min (ref >=60)
GFR, Est Non African American: 57 mL/min — ABNORMAL LOW (ref >=60)
GLUCOSE: 88 mg/dL (ref 65–99)
POTASSIUM: 3.7 mmol/L (ref 3.5–5.3)
Sodium: 139 mmol/L (ref 135–146)
Total Bilirubin: 0.8 mg/dL (ref 0.2–1.2)
Total Protein: 8.1 g/dL (ref 6.1–8.1)

## 2015-11-27 LAB — LIPID PANEL
CHOL/HDL RATIO: 4.9 ratio (ref <=5.0)
Cholesterol: 215 mg/dL — ABNORMAL HIGH (ref 125–200)
HDL: 44 mg/dL — AB (ref >=46)
LDL Cholesterol: 109 mg/dL (ref <130)
Triglycerides: 309 mg/dL — ABNORMAL HIGH (ref <150)
VLDL: 62 mg/dL — ABNORMAL HIGH (ref <30)

## 2015-11-28 ENCOUNTER — Other Ambulatory Visit (HOSPITAL_COMMUNITY)
Admission: RE | Admit: 2015-11-28 | Discharge: 2015-11-28 | Disposition: A | Discharge status: Home / Self Care | Payer: 59 | Source: Ambulatory Visit | Attending: Family Medicine | Admitting: Family Medicine

## 2015-11-28 ENCOUNTER — Ambulatory Visit (INDEPENDENT_AMBULATORY_CARE_PROVIDER_SITE_OTHER): Payer: 59 | Admitting: Family Medicine

## 2015-11-28 ENCOUNTER — Encounter: Payer: Self-pay | Admitting: Family Medicine

## 2015-11-28 VITALS — BP 118/78 | HR 84 | Resp 16 | Ht 66.0 in | Wt 175.0 lb

## 2015-11-28 DIAGNOSIS — R7301 Impaired fasting glucose: Secondary | ICD-10-CM | POA: Diagnosis not present

## 2015-11-28 DIAGNOSIS — E8881 Metabolic syndrome: Secondary | ICD-10-CM

## 2015-11-28 DIAGNOSIS — Z0000 Encounter for general adult medical examination without abnormal findings: Secondary | ICD-10-CM

## 2015-11-28 DIAGNOSIS — Z01419 Encounter for gynecological examination (general) (routine) without abnormal findings: Secondary | ICD-10-CM | POA: Diagnosis not present

## 2015-11-28 DIAGNOSIS — Z1211 Encounter for screening for malignant neoplasm of colon: Secondary | ICD-10-CM

## 2015-11-28 DIAGNOSIS — Z23 Encounter for immunization: Secondary | ICD-10-CM | POA: Diagnosis not present

## 2015-11-28 DIAGNOSIS — Z01411 Encounter for gynecological examination (general) (routine) with abnormal findings: Secondary | ICD-10-CM | POA: Insufficient documentation

## 2015-11-28 DIAGNOSIS — N760 Acute vaginitis: Secondary | ICD-10-CM | POA: Diagnosis present

## 2015-11-28 DIAGNOSIS — E785 Hyperlipidemia, unspecified: Secondary | ICD-10-CM | POA: Diagnosis not present

## 2015-11-28 DIAGNOSIS — I10 Essential (primary) hypertension: Secondary | ICD-10-CM

## 2015-11-28 DIAGNOSIS — Z113 Encounter for screening for infections with a predominantly sexual mode of transmission: Secondary | ICD-10-CM | POA: Diagnosis present

## 2015-11-28 DIAGNOSIS — Z124 Encounter for screening for malignant neoplasm of cervix: Secondary | ICD-10-CM

## 2015-11-28 LAB — POC HEMOCCULT BLD/STL (OFFICE/1-CARD/DIAGNOSTIC): Fecal Occult Blood, POC: NEGATIVE

## 2015-11-28 MED ORDER — LOVASTATIN 40 MG PO TABS: 40.0000 mg | ORAL_TABLET | Freq: Every day | ORAL | Status: DC

## 2015-11-28 NOTE — Patient Instructions (Signed)
F/u in 5.5 month, call igf you need me sooner  CONGRATS on healthy lifestyle changes and successful weight loss  Keep it up Flu vaccine today  You will be contacted with results from today's tests  Cut back on butter and trail mix  Cholesterol is increased but blood sugar MUCH improved, nearly back to normal  ALL GOOD  Changes result in reduced heart disease risk  Please schedule your mammogram  Fasting labs in 5.5 month Thanks for choosing [facility], we consider it a privelige to serve you.

## 2015-11-28 NOTE — Assessment & Plan Note (Signed)
After obtaining informed consent, the vaccine is  administered by LPN.  

## 2015-11-28 NOTE — Progress Notes (Signed)
   Subjective:    Patient ID: Meredith Sanchez, female    DOB: 07/08/1966, 49 y.o.   MRN: [id number]  HPI Patient is in for annual physical exam. No other health concerns are expressed or addressed at the visit. Recent labs, if available are reviewed. Immunization is reviewed , and  updated if needed. Has altered lifestyle for the better with excellent results in weight and labs , also feels much better    Review of Systems    See HPI  Objective:   Physical Exam BP 118/78 mmHg  Pulse 84  Resp 16  Ht 5\' 6"  (1.676 m)  Wt 175 lb (79.379 kg)  BMI 28.26 kg/m2  SpO2 97%  Pleasant well nourished female, alert and oriented x 3, in no cardio-pulmonary distress. Afebrile. HEENT No facial trauma or asymetry. Sinuses non tender.  Extra occullar muscles intact, pupils equally reactive to light. External ears normal, tympanic membranes clear. Oropharynx moist, no exudate, good dentition. Neck: supple, no adenopathy,JVD or thyromegaly.No bruits.  Chest: Clear to ascultation bilaterally.No crackles or wheezes. Non tender to palpation  Breast: No asymetry,no masses or lumps. No tenderness. No nipple discharge or inversion. No axillary or supraclavicular adenopathy  Cardiovascular system; Heart sounds normal,  S1 and  S2 ,no S3.  No murmur, or thrill. Apical beat not displaced Peripheral pulses normal.  Abdomen: Soft, non tender, no organomegaly or masses. No bruits. Bowel sounds normal. No guarding, tenderness or rebound.  Rectal:  Normal sphincter tone. No mass.No rectal masses.  Guaiac negative stool.  GU: External genitalia normal female genitalia , female distribution of hair. No lesions. Urethral meatus normal in size, no  Prolapse, no lesions visibly  Present. Bladder non tender. Vagina pink and moist , with no visible lesions , white  discharge present . Adequate pelvic support no  cystocele or rectocele noted Cervix absent,  Uterus absent, no adnexal masses, no   adnexal tenderness.   Musculoskeletal exam: Full ROM of spine, hips , shoulders and knees. No deformity ,swelling or crepitus noted. No muscle wasting or atrophy.   Neurologic: Cranial nerves 2 to 12 intact. Power, tone ,sensation and reflexes normal throughout. No disturbance in gait. No tremor.  Skin: Intact, no ulceration, erythema , scaling or rash noted. Pigmentation normal throughout  Psych; Normal mood and affect. Judgement and concentration normal        Assessment & Plan:  Annual physical exam Annual exam as documented. Counseling done  re healthy lifestyle involving commitment to 150 minutes exercise per week, heart healthy diet, and attaining healthy weight.The importance of adequate sleep also discussed. Regular seat belt use and home safety, is also discussed. Changes in health habits are decided on by the patient with goals and time frames  set for achieving them. Immunization and cancer screening needs are specifically addressed at this visit.   Need for prophylactic vaccination and inoculation against influenza After obtaining informed consent, the vaccine is  administered by LPN.

## 2015-11-28 NOTE — Assessment & Plan Note (Signed)

## 2015-11-29 LAB — CERVICOVAGINAL ANCILLARY ONLY: Wet Prep (BD Affirm): POSITIVE — AB

## 2015-11-29 LAB — CYTOLOGY - PAP

## 2015-12-03 ENCOUNTER — Telehealth: Payer: Self-pay | Admitting: Family Medicine

## 2015-12-03 MED ORDER — FLUCONAZOLE 150 MG PO TABS: 150.0000 mg | ORAL_TABLET | Freq: Once | ORAL | Status: DC

## 2015-12-03 MED ORDER — METRONIDAZOLE 500 MG PO TABS: 500.0000 mg | ORAL_TABLET | Freq: Two times a day (BID) | ORAL | Status: DC

## 2015-12-03 NOTE — Addendum Note (Signed)
Addended by: Kandis FantasiaSLADE, Geroldine Esquivias B on: 12/03/2015 09:50 AM   Modules accepted: Orders

## 2015-12-03 NOTE — Telephone Encounter (Signed)
Patient aware of results.

## 2015-12-24 ENCOUNTER — Other Ambulatory Visit: Payer: Self-pay

## 2015-12-24 MED ORDER — FLUCONAZOLE 150 MG PO TABS: 150.0000 mg | ORAL_TABLET | Freq: Once | ORAL | Status: DC

## 2016-02-28 ENCOUNTER — Other Ambulatory Visit: Payer: Self-pay

## 2016-02-28 MED ORDER — TRIAMTERENE-HCTZ 37.5-25 MG PO TABS: ORAL_TABLET | ORAL | Status: DC

## 2016-04-28 ENCOUNTER — Encounter: Payer: Self-pay | Admitting: Family Medicine

## 2016-04-28 ENCOUNTER — Ambulatory Visit (INDEPENDENT_AMBULATORY_CARE_PROVIDER_SITE_OTHER): Payer: BLUE CROSS/BLUE SHIELD | Admitting: Family Medicine

## 2016-04-28 VITALS — BP 122/80 | HR 84 | Resp 18 | Ht 66.0 in | Wt 185.0 lb

## 2016-04-28 DIAGNOSIS — E669 Obesity, unspecified: Secondary | ICD-10-CM

## 2016-04-28 DIAGNOSIS — I10 Essential (primary) hypertension: Secondary | ICD-10-CM | POA: Diagnosis not present

## 2016-04-28 DIAGNOSIS — M542 Cervicalgia: Secondary | ICD-10-CM | POA: Diagnosis not present

## 2016-04-28 DIAGNOSIS — E785 Hyperlipidemia, unspecified: Secondary | ICD-10-CM

## 2016-04-28 DIAGNOSIS — E8881 Metabolic syndrome: Secondary | ICD-10-CM

## 2016-04-28 MED ORDER — GABAPENTIN 100 MG PO CAPS: 100.0000 mg | ORAL_CAPSULE | Freq: Every day | ORAL | Status: DC

## 2016-04-28 MED ORDER — IBUPROFEN 800 MG PO TABS: 800.0000 mg | ORAL_TABLET | Freq: Three times a day (TID) | ORAL | Status: DC | PRN

## 2016-04-28 MED ORDER — TRIAMTERENE-HCTZ 37.5-25 MG PO TABS: ORAL_TABLET | ORAL | Status: DC

## 2016-04-28 MED ORDER — RANITIDINE HCL 150 MG PO CAPS: 150.0000 mg | ORAL_CAPSULE | Freq: Two times a day (BID) | ORAL | Status: DC

## 2016-04-28 MED ORDER — METHYLPREDNISOLONE ACETATE 80 MG/ML IJ SUSP
80.0000 mg | Freq: Once | INTRAMUSCULAR | Status: AC
Start: 2016-04-28 — End: 2016-04-28
  Administered 2016-04-28: 80 mg via INTRAMUSCULAR

## 2016-04-28 MED ORDER — KETOROLAC TROMETHAMINE 60 MG/2ML IM SOLN
60.0000 mg | Freq: Once | INTRAMUSCULAR | Status: AC
  Administered 2016-04-28: 60 mg via INTRAMUSCULAR

## 2016-04-28 MED ORDER — PREDNISONE 5 MG (21) PO TBPK: 5.0000 mg | ORAL_TABLET | ORAL | Status: DC

## 2016-04-28 NOTE — Progress Notes (Signed)
Subjective:    Patient ID: Meredith Sanchez, female    DOB: January 07, 1966, 50 y.o.   MRN: [id number]  HPI   Meredith Sanchez     MRN: [id number]      DOB: Apr 25, 1966   HPI Meredith Sanchez is here for follow up and re-evaluation of chronic medical conditions, medication management and review of any available recent lab and radiology data.  Preventive health is updated, specifically  Cancer screening and Immunization.   Questions or concerns regarding consultations or procedures which the PT has had in the interim are  addressed. The PT denies any adverse reactions to current medications since the last visit.  2 month h/o right arm pain, rated at a 6 , her concern is primarily that she has to go to through metal detector on her new 2 month job 5 times per day, over an 8 hr period. Concerned that he r pain is because of titanium chip in the breast which was biopsied approx 5 years ago. Direct contact made with radiologist re this question and the Doc did not feel this was a problem that would contribute to her symptom  ROS Denies recent fever or chills. Denies sinus pressure, nasal congestion, ear pain or sore throat. Denies chest congestion, productive cough or wheezing. Denies chest pains, palpitations and leg swelling Denies abdominal pain, nausea, vomiting,diarrhea or constipation.   Denies dysuria, frequency, hesitancy or incontinence.  Denies headaches, seizures, numbness, or tingling. Denies depression, anxiety or insomnia. Denies skin break down or rash.   PE  BP 122/80 mmHg  Pulse 84  Resp 18  Ht  (1.676 m)  Wt 185 lb (83.915 kg)  BMI 29.87 kg/m2  SpO2 96%  Patient alert and oriented and in no cardiopulmonary distress.  HEENT: No facial asymmetry, EOMI,   oropharynx pink and moist.  Neck decreased though adequate ROM no JVD, no mass. Left trapezius spasmChest: Clear to auscultation bilaterally.  CVS: S1, S2 no murmurs, no S3.Regular rate.  ABD: Soft non tender.   Ext:  No edema  MS: Adequate ROM spine, shoulders, hips and knees.  Skin: Intact, no ulcerations or rash noted.  Psych: Good eye contact,   CNSnormal grade 4 power in right hand grip, reduced sensation in right arm, normal power and sensation in other 3 extremities   Assessment & Plan  Neck pain on right side 2 month h/o worsening pain  Uncontrolled.Toradol and depo medrol administered IM in the office , to be followed by a short course of oral prednisone and NSAIDS.   Essential hypertension Controlled, no change in medication DASH diet and commitment to daily physical activity for a minimum of 30 minutes discussed and encouraged, as a part of hypertension management. The importance of attaining a healthy weight is also discussed.  BP/Weight 04/28/2016 11/28/2015 04/04/2015 08/20/2014 08/17/2014 07/10/2014 09/25/2013  Systolic BP 122 118 124 117 137 142 138  Diastolic BP 80 78 82 80 85 80 82  Wt. (Lbs) 185 175 195 - 195.4 196 197.12  BMI 29.87 28.26 31.49 - 31.55 31.65 31.83        Obesity Deteriorated. Patient re-educated about  the importance of commitment to a  minimum of 150 minutes of exercise per week.  The importance of healthy food choices with portion control discussed. Encouraged to start a food diary, count calories and to consider  joining a support group. Sample diet sheets offered. Goals set by the patient for the next several months.   Weight /BMI  04/28/2016 11/28/2015 04/04/2015  WEIGHT 185 lb 175 lb 195 lb  HEIGHT 5\' 6"  5\' 6"  5\' 6"   BMI 29.87 kg/m2 28.26 kg/m2 31.49 kg/m2    Current exercise per week 90 minutes.   Hyperlipidemia LDL goal <100 Hyperlipidemia:Low fat diet discussed and encouraged.   Lipid Panel  Lab Results  Component Value Date   CHOL 215* 11/26/2015   HDL 44* 11/26/2015   LDLCALC 109 11/26/2015   TRIG 309* 11/26/2015   CHOLHDL 4.9 11/26/2015    Updated lab needed at/ before next visit. Uncontrolled     Metabolic syndrome X The  increased risk of cardiovascular disease associated with this diagnosis, and the need to consistently work on lifestyle to change this is discussed. Following  a  heart healthy diet ,commitment to 30 minutes of exercise at least 5 days per week, as well as control of blood sugar and cholesterol , and achieving a healthy weight are all the areas to be addressed .        Review of Systems     Objective:   Physical Exam        Assessment & Plan:

## 2016-04-28 NOTE — Patient Instructions (Signed)
Physical exam 12/30 or after, call if you need me sooner  Arm pain , I believe is from arthritis in neck. Medication in office and sent, will need to call back for more evaluation if pain persists  Labs fasting either tomorrow or next Thursday or adfter  Please work on good  health habits so that your health will improve. 1. Commitment to daily physical activity for 30 to 60  minutes, if you are able to do this.  2. Commitment to wise food choices. Aim for half of your  food intake to be vegetable and fruit, one quarter starchy foods, and one quarter protein. Try to eat on a regular schedule  3 meals per day, snacking between meals should be limited to vegetables or fruits or small portions of nuts. 64 ounces of water per day is generally recommended, unless you have specific health conditions, like heart failure or kidney failure where you will need to limit fluid intake.  3. Commitment to sufficient and a  good quality of physical and mental rest daily, generally between 6 to 8 hours per day.  WITH PERSISTANCE AND PERSEVERANCE, THE IMPOSSIBLE , BECOMES THE NORM!   Thank you  for choosing [facility]. We consider it a privelige to serve you.  Delivering excellent health care in a caring and  compassionate way is our goal.  Partnering with you,  so that together we can achieve this goal is our strategy.

## 2016-04-28 NOTE — Assessment & Plan Note (Signed)
2 month h/o worsening pain  Uncontrolled.Toradol and depo medrol administered IM in the office , to be followed by a short course of oral prednisone and NSAIDS.

## 2016-04-30 NOTE — Assessment & Plan Note (Signed)
Deteriorated. Patient re-educated about  the importance of commitment to a  minimum of 150 minutes of exercise per week.  The importance of healthy food choices with portion control discussed. Encouraged to start a food diary, count calories and to consider  joining a support group. Sample diet sheets offered. Goals set by the patient for the next several months.   Weight /BMI 04/28/2016 11/28/2015 04/04/2015  WEIGHT 185 lb 175 lb 195 lb  HEIGHT 5\' 6"  5\' 6"  5\' 6"   BMI 29.87 kg/m2 28.26 kg/m2 31.49 kg/m2    Current exercise per week 90 minutes.

## 2016-04-30 NOTE — Assessment & Plan Note (Signed)
Hyperlipidemia:Low fat diet discussed and encouraged.   Lipid Panel  Lab Results  Component Value Date   CHOL 215* 11/26/2015   HDL 44* 11/26/2015   LDLCALC 109 11/26/2015   TRIG 309* 11/26/2015   CHOLHDL 4.9 11/26/2015    Updated lab needed at/ before next visit. Uncontrolled

## 2016-04-30 NOTE — Assessment & Plan Note (Signed)
The increased risk of cardiovascular disease associated with this diagnosis, and the need to consistently work on lifestyle to change this is discussed. Following  a  heart healthy diet ,commitment to 30 minutes of exercise at least 5 days per week, as well as control of blood sugar and cholesterol , and achieving a healthy weight are all the areas to be addressed .  

## 2016-04-30 NOTE — Assessment & Plan Note (Signed)
Controlled, no change in medication DASH diet and commitment to daily physical activity for a minimum of 30 minutes discussed and encouraged, as a part of hypertension management. The importance of attaining a healthy weight is also discussed.  BP/Weight 04/28/2016 11/28/2015 04/04/2015 08/20/2014 08/17/2014 07/10/2014 09/25/2013  Systolic BP 122 118 124 117 137 142 138  Diastolic BP 80 78 82 80 85 80 82  Wt. (Lbs) 185 175 195 - 195.4 196 197.12  BMI 29.87 28.26 31.49 - 31.55 31.65 31.83

## 2016-05-12 LAB — CBC
HCT: 38.5 % (ref 35.0–45.0)
Hemoglobin: 13.2 g/dL (ref 11.7–15.5)
MCH: 31.9 pg (ref 27.0–33.0)
MCHC: 34.3 g/dL (ref 32.0–36.0)
MCV: 93 fL (ref 80.0–100.0)
MPV: 9.3 fL (ref 7.5–12.5)
PLATELETS: 325 10*3/uL (ref 140–400)
RBC: 4.14 MIL/uL (ref 3.80–5.10)
RDW: 14.5 % (ref 11.0–15.0)
WBC: 6.9 10*3/uL (ref 3.8–10.8)

## 2016-05-12 LAB — HEMOGLOBIN A1C
Hgb A1c MFr Bld: 6.1 % — ABNORMAL HIGH (ref <5.7)
MEAN PLASMA GLUCOSE: 128 mg/dL

## 2016-05-13 LAB — COMPREHENSIVE METABOLIC PANEL
ALT: 15 U/L (ref 6–29)
AST: 16 U/L (ref 10–35)
Albumin: 4.5 g/dL (ref 3.6–5.1)
Alkaline Phosphatase: 42 U/L (ref 33–130)
BUN: 21 mg/dL (ref 7–25)
CALCIUM: 9.8 mg/dL (ref 8.6–10.4)
CO2: 26 mmol/L (ref 20–31)
Chloride: 97 mmol/L — ABNORMAL LOW (ref 98–110)
Creat: 1.17 mg/dL — ABNORMAL HIGH (ref 0.50–1.05)
GLUCOSE: 89 mg/dL (ref 65–99)
Potassium: 3.9 mmol/L (ref 3.5–5.3)
SODIUM: 137 mmol/L (ref 135–146)
Total Bilirubin: 0.6 mg/dL (ref 0.2–1.2)
Total Protein: 8 g/dL (ref 6.1–8.1)

## 2016-05-13 LAB — LIPID PANEL
Cholesterol: 207 mg/dL — ABNORMAL HIGH (ref 125–200)
HDL: 57 mg/dL
LDL Cholesterol: 92 mg/dL
Total CHOL/HDL Ratio: 3.6 ratio
Triglycerides: 288 mg/dL — ABNORMAL HIGH
VLDL: 58 mg/dL — ABNORMAL HIGH

## 2016-05-13 LAB — TSH: TSH: 1.63 mIU/L

## 2016-05-15 NOTE — Addendum Note (Signed)
Addended by: Abner GreenspanHUDY, Mayo Owczarzak H on: 05/15/2016 10:41 AM   Modules accepted: Orders

## 2016-06-03 ENCOUNTER — Other Ambulatory Visit: Payer: Self-pay

## 2016-06-03 MED ORDER — TRIAMTERENE-HCTZ 37.5-25 MG PO TABS: ORAL_TABLET | ORAL | Status: DC

## 2016-07-15 ENCOUNTER — Telehealth: Payer: Self-pay | Admitting: Family Medicine

## 2016-07-15 NOTE — Telephone Encounter (Signed)
Meredith Sanchez is calling asking for a refill on [facility] [place]

## 2016-07-16 ENCOUNTER — Other Ambulatory Visit: Payer: Self-pay

## 2016-07-16 MED ORDER — HYDROCORTISONE ACE-PRAMOXINE 1-1 % RE CREA: TOPICAL_CREAM | RECTAL | 0 refills | Status: DC

## 2016-07-16 NOTE — Telephone Encounter (Signed)
This is on her historical list. Ok to refill?

## 2016-07-16 NOTE — Telephone Encounter (Signed)
Yes pls refill and let her know

## 2016-07-17 NOTE — Telephone Encounter (Signed)
Med refilled.

## 2016-09-08 ENCOUNTER — Other Ambulatory Visit: Payer: Self-pay | Admitting: Family Medicine

## 2016-12-01 ENCOUNTER — Encounter: Payer: BLUE CROSS/BLUE SHIELD | Admitting: Family Medicine

## 2016-12-09 ENCOUNTER — Telehealth: Payer: Self-pay

## 2016-12-09 DIAGNOSIS — E785 Hyperlipidemia, unspecified: Secondary | ICD-10-CM

## 2016-12-09 DIAGNOSIS — I10 Essential (primary) hypertension: Secondary | ICD-10-CM

## 2016-12-09 DIAGNOSIS — R7301 Impaired fasting glucose: Secondary | ICD-10-CM

## 2016-12-09 NOTE — Telephone Encounter (Signed)
Expired labs reordered  

## 2016-12-15 LAB — LIPID PANEL
CHOL/HDL RATIO: 4 ratio (ref <5.0)
Cholesterol: 172 mg/dL (ref <200)
HDL: 43 mg/dL — AB (ref >50)
LDL CALC: 57 mg/dL (ref <100)
Triglycerides: 362 mg/dL — ABNORMAL HIGH (ref <150)
VLDL: 72 mg/dL — AB (ref <30)

## 2016-12-15 LAB — BASIC METABOLIC PANEL WITH GFR
BUN: 20 mg/dL (ref 7–25)
CALCIUM: 9.6 mg/dL (ref 8.6–10.4)
CO2: 27 mmol/L (ref 20–31)
Chloride: 102 mmol/L (ref 98–110)
Creat: 1.01 mg/dL (ref 0.50–1.05)
GFR, EST NON AFRICAN AMERICAN: 65 mL/min (ref >=60)
GFR, Est African American: 74 mL/min (ref >=60)
GLUCOSE: 100 mg/dL — AB (ref 65–99)
Potassium: 3.8 mmol/L (ref 3.5–5.3)
Sodium: 139 mmol/L (ref 135–146)

## 2016-12-15 LAB — HEMOGLOBIN A1C
HEMOGLOBIN A1C: 5.7 % — AB (ref <5.7)
Mean Plasma Glucose: 117 mg/dL

## 2016-12-17 ENCOUNTER — Encounter: Payer: BLUE CROSS/BLUE SHIELD | Admitting: Family Medicine

## 2016-12-18 ENCOUNTER — Other Ambulatory Visit: Payer: Self-pay

## 2016-12-18 DIAGNOSIS — E785 Hyperlipidemia, unspecified: Secondary | ICD-10-CM

## 2016-12-18 MED ORDER — TRIAMTERENE-HCTZ 37.5-25 MG PO TABS: 1.5000 | ORAL_TABLET | Freq: Every day | ORAL | 0 refills | Status: DC

## 2016-12-18 MED ORDER — LOVASTATIN 40 MG PO TABS: 40.0000 mg | ORAL_TABLET | Freq: Every day | ORAL | 0 refills | Status: DC

## 2016-12-24 ENCOUNTER — Encounter: Payer: Self-pay | Admitting: Family Medicine

## 2016-12-30 ENCOUNTER — Ambulatory Visit: Payer: Managed Care, Other (non HMO)

## 2016-12-30 VITALS — BP 132/80

## 2016-12-30 DIAGNOSIS — I10 Essential (primary) hypertension: Secondary | ICD-10-CM

## 2016-12-30 NOTE — Progress Notes (Signed)
Patient in for nurse blood pressure check.  Has been getting high readings on her home monitor.  She has had no symptoms of blood pressure being high (headache, dizziness, chest pain).  Is taking medications as prescribed.    Will continue to take prescribed medication and keep next scheduled appointment

## 2017-01-01 ENCOUNTER — Other Ambulatory Visit: Payer: Self-pay | Admitting: Family Medicine

## 2017-01-28 ENCOUNTER — Ambulatory Visit (INDEPENDENT_AMBULATORY_CARE_PROVIDER_SITE_OTHER): Payer: Managed Care, Other (non HMO) | Admitting: Family Medicine

## 2017-01-28 ENCOUNTER — Encounter: Payer: Self-pay | Admitting: Family Medicine

## 2017-01-28 VITALS — BP 140/92 | HR 82 | Resp 15 | Ht 66.0 in | Wt 187.0 lb

## 2017-01-28 DIAGNOSIS — I10 Essential (primary) hypertension: Secondary | ICD-10-CM

## 2017-01-28 DIAGNOSIS — E6609 Other obesity due to excess calories: Secondary | ICD-10-CM | POA: Diagnosis not present

## 2017-01-28 DIAGNOSIS — Z1211 Encounter for screening for malignant neoplasm of colon: Secondary | ICD-10-CM

## 2017-01-28 DIAGNOSIS — Z6830 Body mass index (BMI) 30.0-30.9, adult: Secondary | ICD-10-CM

## 2017-01-28 DIAGNOSIS — E785 Hyperlipidemia, unspecified: Secondary | ICD-10-CM

## 2017-01-28 DIAGNOSIS — Z0000 Encounter for general adult medical examination without abnormal findings: Secondary | ICD-10-CM | POA: Diagnosis not present

## 2017-01-28 NOTE — Patient Instructions (Addendum)
F/u early October, call if you need me sooner  Call for COMPLETE fasting lab order 1 month before please  Blood pressure slightly high and your triglycerides, please modify diet as discussed and commit to regular exercise  Thank you  for choosing  Primary Care. We consider it a privelige to serve you.  Delivering excellent health care in a caring and  compassionate way is our goal.  Partnering with you,  so that together we can achieve this goal is our strategy.   Start gabapentin one at bedtime for hot flashes and keep fan on

## 2017-01-28 NOTE — Progress Notes (Signed)
Meredith Sanchez     MRN: [id number]      DOB: Jul 13, 1966  HPI: Patient is in for annual physical exam. C/o hot flashes in past 2 month Recent labs,  are reviewed. Immunization is reviewed , and  updated if needed.   PE: BP (!) 140/92   Pulse 82   Resp 15   Ht 5\' 6"  (1.676 m)   Wt 187 lb (84.8 kg)   SpO2 97%   BMI 30.18 kg/m   Pleasant  female, alert and oriented x 3, in no cardio-pulmonary distress. Afebrile. HEENT No facial trauma or asymetry. Sinuses non tender.  Extra occullar muscles intact, pupils equally reactive to light. External ears normal, tympanic membranes clear. Oropharynx moist, no exudate. Neck: supple, no adenopathy,JVD or thyromegaly.No bruits.  Chest: Clear to ascultation bilaterally.No crackles or wheezes. Non tender to palpation  Breast: No asymetry,no masses or lumps. No tenderness. No nipple discharge or inversion. No axillary or supraclavicular adenopathy  Cardiovascular system; Heart sounds normal,  S1 and  S2 ,no S3.  No murmur, or thrill. Apical beat not displaced Peripheral pulses normal.  Abdomen: Soft, non tender, no organomegaly or masses. No bruits. Bowel sounds normal. No guarding, tenderness or rebound.  Rectal:  Normal sphincter tone. No rectal mass. Guaiac negative stool.  GU: Not examined. No complaints , and she is s/p hysterectomy   Musculoskeletal exam: Full ROM of spine, hips , shoulders and knees. No deformity ,swelling or crepitus noted. No muscle wasting or atrophy.   Neurologic: Cranial nerves 2 to 12 intact. Power, tone ,sensation and reflexes normal throughout. No disturbance in gait. No tremor.  Skin: Intact, no ulceration, erythema , scaling or rash noted. Pigmentation normal throughout  Psych; Normal mood and affect. Judgement and concentration normal   Assessment & Plan:  Annual physical exam Annual exam as documented. Counseling done  re healthy lifestyle involving commitment to 150  minutes exercise per week, heart healthy diet, and attaining healthy weight.The importance of adequate sleep also discussed. Regular seat belt use and home safety, is also discussed. Changes in health habits are decided on by the patient with goals and time frames  set for achieving them. Immunization and cancer screening needs are specifically addressed at this visit.   Essential hypertension Uncontrolled, no med change DASH diet and commitment to daily physical activity for a minimum of 30 minutes discussed and encouraged, as a part of hypertension management. The importance of attaining a healthy weight is also discussed.  BP/Weight 01/28/2017 12/30/2016 04/28/2016 11/28/2015 04/04/2015 08/20/2014 08/17/2014  Systolic BP 140 132 122 118 124 117 137  Diastolic BP 92 80 80 78 82 80 85  Wt. (Lbs) 187 - 185 175 195 - 195.4  BMI 30.18 - 29.87 28.26 31.49 - 31.55       Hyperlipidemia LDL goal <100 Uncontrolled Hyperlipidemia:Low fat diet discussed and encouraged.   Lipid Panel  Lab Results  Component Value Date   CHOL 172 12/14/2016   HDL 43 (L) 12/14/2016   LDLCALC 57 12/14/2016   TRIG 362 (H) 12/14/2016   CHOLHDL 4.0 12/14/2016  Updated lab needed at/ before next visit.      Obesity Deteriorated. Patient re-educated about  the importance of commitment to a  minimum of 150 minutes of exercise per week.  The importance of healthy food choices with portion control discussed. Encouraged to start a food diary, count calories and to consider  joining a support group. Sample diet sheets offered. Goals set by  the patient for the next several months.   Weight /BMI 01/28/2017 04/28/2016 11/28/2015  WEIGHT 187 lb 185 lb 175 lb  HEIGHT 5\' 6"  5\' 6"  5\' 6"   BMI 30.18 kg/m2 29.87 kg/m2 28.26 kg/m2

## 2017-01-30 ENCOUNTER — Encounter: Payer: Self-pay | Admitting: Family Medicine

## 2017-01-30 NOTE — Assessment & Plan Note (Signed)
Uncontrolled, no med change DASH diet and commitment to daily physical activity for a minimum of 30 minutes discussed and encouraged, as a part of hypertension management. The importance of attaining a healthy weight is also discussed.  BP/Weight 01/28/2017 12/30/2016 04/28/2016 11/28/2015 04/04/2015 08/20/2014 08/17/2014  Systolic BP 140 132 122 118 124 117 137  Diastolic BP 92 80 80 78 82 80 85  Wt. (Lbs) 187 - 185 175 195 - 195.4  BMI 30.18 - 29.87 28.26 31.49 - 31.55

## 2017-01-30 NOTE — Assessment & Plan Note (Signed)

## 2017-01-30 NOTE — Assessment & Plan Note (Signed)
Uncontrolled Hyperlipidemia:Low fat diet discussed and encouraged.   Lipid Panel  Lab Results  Component Value Date   CHOL 172 12/14/2016   HDL 43 (L) 12/14/2016   LDLCALC 57 12/14/2016   TRIG 362 (H) 12/14/2016   CHOLHDL 4.0 12/14/2016  Updated lab needed at/ before next visit.

## 2017-01-30 NOTE — Assessment & Plan Note (Signed)
Deteriorated. Patient re-educated about  the importance of commitment to a  minimum of 150 minutes of exercise per week.  The importance of healthy food choices with portion control discussed. Encouraged to start a food diary, count calories and to consider  joining a support group. Sample diet sheets offered. Goals set by the patient for the next several months.   Weight /BMI 01/28/2017 04/28/2016 11/28/2015  WEIGHT 187 lb 185 lb 175 lb  HEIGHT 5\' 6"  5\' 6"  5\' 6"   BMI 30.18 kg/m2 29.87 kg/m2 28.26 kg/m2

## 2017-02-01 LAB — POC HEMOCCULT BLD/STL (OFFICE/1-CARD/DIAGNOSTIC): FECAL OCCULT BLD: NEGATIVE

## 2017-02-01 NOTE — Addendum Note (Signed)
Addended by: Abner GreenspanHUDY, Latonia Conrow H on: 02/01/2017 07:52 AM   Modules accepted: Orders

## 2017-03-03 ENCOUNTER — Other Ambulatory Visit: Payer: Self-pay | Admitting: Family Medicine

## 2017-04-05 ENCOUNTER — Other Ambulatory Visit: Payer: Self-pay | Admitting: Family Medicine

## 2017-04-05 DIAGNOSIS — E785 Hyperlipidemia, unspecified: Secondary | ICD-10-CM

## 2017-04-05 DIAGNOSIS — M542 Cervicalgia: Secondary | ICD-10-CM

## 2017-04-05 MED ORDER — LOVASTATIN 40 MG PO TABS: 40.0000 mg | ORAL_TABLET | Freq: Every day | ORAL | 1 refills | Status: DC

## 2017-04-12 ENCOUNTER — Other Ambulatory Visit: Payer: Self-pay

## 2017-04-14 ENCOUNTER — Other Ambulatory Visit: Payer: Self-pay

## 2017-04-15 ENCOUNTER — Telehealth: Payer: Self-pay

## 2017-04-15 ENCOUNTER — Other Ambulatory Visit: Payer: Self-pay

## 2017-04-15 DIAGNOSIS — E785 Hyperlipidemia, unspecified: Secondary | ICD-10-CM

## 2017-04-15 MED ORDER — LOVASTATIN 40 MG PO TABS: 40.0000 mg | ORAL_TABLET | Freq: Every day | ORAL | 1 refills | Status: DC

## 2017-04-15 NOTE — Telephone Encounter (Signed)
rx for [facility]vacor sent to [facility] per pt.

## 2017-07-07 NOTE — Progress Notes (Signed)
REVIEWED-NO ADDITIONAL RECOMMENDATIONS. 

## 2017-09-16 ENCOUNTER — Ambulatory Visit: Payer: Managed Care, Other (non HMO) | Admitting: Family Medicine

## 2017-10-01 ENCOUNTER — Other Ambulatory Visit: Payer: Self-pay

## 2017-10-01 ENCOUNTER — Telehealth: Payer: Self-pay | Admitting: Family Medicine

## 2017-10-01 DIAGNOSIS — E785 Hyperlipidemia, unspecified: Secondary | ICD-10-CM

## 2017-10-01 MED ORDER — TRIAMTERENE-HCTZ 37.5-25 MG PO TABS: 1.5000 | ORAL_TABLET | Freq: Every day | ORAL | 0 refills | Status: DC

## 2017-10-01 MED ORDER — LOVASTATIN 40 MG PO TABS: 40.0000 mg | ORAL_TABLET | Freq: Every day | ORAL | 0 refills | Status: DC

## 2017-10-01 NOTE — Telephone Encounter (Signed)
Patient is requesting blood pressure medication and cholesterol medication to be refilled. Pharmacy: [facility] [place] [place] Cb#: [phone]

## 2017-10-01 NOTE — Telephone Encounter (Signed)
Add to last message:  Patient is requesting 90 day supply.

## 2017-10-01 NOTE — Telephone Encounter (Signed)
meds sent

## 2017-10-07 ENCOUNTER — Telehealth: Payer: Self-pay | Admitting: *Deleted

## 2017-10-07 DIAGNOSIS — R7301 Impaired fasting glucose: Secondary | ICD-10-CM

## 2017-10-07 DIAGNOSIS — I10 Essential (primary) hypertension: Secondary | ICD-10-CM

## 2017-10-07 DIAGNOSIS — E785 Hyperlipidemia, unspecified: Secondary | ICD-10-CM

## 2017-10-07 NOTE — Telephone Encounter (Signed)
Patient left message stating her lab order needs to be sent to the lab. [phone]

## 2017-10-08 ENCOUNTER — Telehealth: Payer: Self-pay | Admitting: Family Medicine

## 2017-10-08 NOTE — Telephone Encounter (Signed)
Labs ordered.

## 2017-10-08 NOTE — Telephone Encounter (Signed)
Need lab orders for Monday appt

## 2017-10-08 NOTE — Telephone Encounter (Signed)
Lab order sent to [facility]  

## 2017-10-11 ENCOUNTER — Ambulatory Visit (INDEPENDENT_AMBULATORY_CARE_PROVIDER_SITE_OTHER): Payer: Managed Care, Other (non HMO) | Admitting: Family Medicine

## 2017-10-11 ENCOUNTER — Encounter: Payer: Self-pay | Admitting: Family Medicine

## 2017-10-11 VITALS — BP 128/84 | HR 76 | Resp 16 | Ht 66.0 in | Wt 196.0 lb

## 2017-10-11 DIAGNOSIS — R7301 Impaired fasting glucose: Secondary | ICD-10-CM

## 2017-10-11 DIAGNOSIS — E8941 Symptomatic postprocedural ovarian failure: Secondary | ICD-10-CM

## 2017-10-11 DIAGNOSIS — R7303 Prediabetes: Secondary | ICD-10-CM

## 2017-10-11 DIAGNOSIS — E785 Hyperlipidemia, unspecified: Secondary | ICD-10-CM | POA: Diagnosis not present

## 2017-10-11 DIAGNOSIS — I10 Essential (primary) hypertension: Secondary | ICD-10-CM | POA: Diagnosis not present

## 2017-10-11 LAB — HEMOGLOBIN A1C
Hgb A1c MFr Bld: 6.1 % of total Hgb — ABNORMAL HIGH (ref <5.7)
Mean Plasma Glucose: 128 (calc)
eAG (mmol/L): 7.1 (calc)

## 2017-10-11 LAB — COMPLETE METABOLIC PANEL WITH GFR
AG Ratio: 1.4 (calc) (ref 1.0–2.5)
ALBUMIN MSPROF: 4.4 g/dL (ref 3.6–5.1)
ALT: 27 U/L (ref 6–29)
AST: 22 U/L (ref 10–35)
Alkaline phosphatase (APISO): 44 U/L (ref 33–130)
BILIRUBIN TOTAL: 0.5 mg/dL (ref 0.2–1.2)
BUN / CREAT RATIO: 23 (calc) — AB (ref 6–22)
BUN: 27 mg/dL — AB (ref 7–25)
CALCIUM: 9.6 mg/dL (ref 8.6–10.4)
CHLORIDE: 102 mmol/L (ref 98–110)
CO2: 28 mmol/L (ref 20–32)
CREATININE: 1.15 mg/dL — AB (ref 0.50–1.05)
GFR, EST AFRICAN AMERICAN: 63 mL/min/{1.73_m2} (ref >=60)
GFR, Est Non African American: 55 mL/min/{1.73_m2} — ABNORMAL LOW (ref >=60)
GLUCOSE: 125 mg/dL — AB (ref 65–99)
Globulin: 3.2 g/dL (calc) (ref 1.9–3.7)
Potassium: 3.9 mmol/L (ref 3.5–5.3)
Sodium: 139 mmol/L (ref 135–146)
TOTAL PROTEIN: 7.6 g/dL (ref 6.1–8.1)

## 2017-10-11 LAB — LIPID PANEL
CHOL/HDL RATIO: 4 (calc) (ref <5.0)
Cholesterol: 173 mg/dL (ref <200)
HDL: 43 mg/dL — ABNORMAL LOW (ref >50)
LDL CHOLESTEROL (CALC): 95 mg/dL
Non-HDL Cholesterol (Calc): 130 mg/dL (calc) — ABNORMAL HIGH (ref <130)
TRIGLYCERIDES: 233 mg/dL — AB (ref <150)

## 2017-10-11 LAB — TSH: TSH: 2.05 m[IU]/L

## 2017-10-11 LAB — CBC
HEMATOCRIT: 36.2 % (ref 35.0–45.0)
Hemoglobin: 12.5 g/dL (ref 11.7–15.5)
MCH: 32.1 pg (ref 27.0–33.0)
MCHC: 34.5 g/dL (ref 32.0–36.0)
MCV: 93.1 fL (ref 80.0–100.0)
MPV: 10.2 fL (ref 7.5–12.5)
PLATELETS: 360 10*3/uL (ref 140–400)
RBC: 3.89 10*6/uL (ref 3.80–5.10)
RDW: 13.8 % (ref 11.0–15.0)
WBC: 5.3 10*3/uL (ref 3.8–10.8)

## 2017-10-11 MED ORDER — HYDROCORTISONE 2.5 % RE CREA
1.0000 | TOPICAL_CREAM | Freq: Two times a day (BID) | RECTAL | 1 refills | Status: DC
Start: 2017-10-11 — End: 2018-02-14

## 2017-10-11 MED ORDER — GABAPENTIN 100 MG PO CAPS: 100.0000 mg | ORAL_CAPSULE | Freq: Three times a day (TID) | ORAL | 1 refills | Status: DC

## 2017-10-11 NOTE — Patient Instructions (Signed)
Physical exam mid March, call if you need mew before  Increase gabapentin to three daily  Pls stop sodas and increase vegetable, and water, sugar has increased, cholesterol is better  HBA1C, lipid, cmp 1 week before visit  It is important that you exercise regularly at least 30 minutes 5 times a week. If you develop chest pain, have severe difficulty breathing, or feel very tired, stop exercising immediately and seek medical attention   Please work on good  health habits so that your health will improve. 1. Commitment to daily physical activity for 30 to 60  minutes, if you are able to do this.  2. Commitment to wise food choices. Aim for half of your  food intake to be vegetable and fruit, one quarter starchy foods, and one quarter protein. Try to eat on a regular schedule  3 meals per day, snacking between meals should be limited to vegetables or fruits or small portions of nuts. 64 ounces of water per day is generally recommended, unless you have specific health conditions, like heart failure or kidney failure where you will need to limit fluid intake.  3. Commitment to sufficient and a  good quality of physical and mental rest daily, generally between 6 to 8 hours per day.  WITH PERSISTANCE AND PERSEVERANCE, THE IMPOSSIBLE , BECOMES THE NORM!  Thank you  for choosing [facility]. We consider it a privelige to serve you.  Delivering excellent health care in a caring and  compassionate way is our goal.  Partnering with you,  so that together we can achieve this goal is our strategy.

## 2017-10-13 ENCOUNTER — Encounter: Payer: Self-pay | Admitting: Family Medicine

## 2017-10-13 DIAGNOSIS — E8941 Symptomatic postprocedural ovarian failure: Secondary | ICD-10-CM | POA: Insufficient documentation

## 2017-10-13 NOTE — Assessment & Plan Note (Signed)
Deteriorated. Patient re-educated about  the importance of commitment to a  minimum of 150 minutes of exercise per week.  The importance of healthy food choices with portion control discussed. Encouraged to start a food diary, count calories and to consider  joining a support group. Sample diet sheets offered. Goals set by the patient for the next several months.   Weight /BMI 10/11/2017 01/28/2017 04/28/2016  WEIGHT 196 lb 187 lb 185 lb  HEIGHT 5\' 6"  5\' 6"  5\' 6"   BMI 31.64 kg/m2 30.18 kg/m2 29.87 kg/m2

## 2017-10-13 NOTE — Assessment & Plan Note (Signed)
Controlled, no change in medication DASH diet and commitment to daily physical activity for a minimum of 30 minutes discussed and encouraged, as a part of hypertension management. The importance of attaining a healthy weight is also discussed.  BP/Weight 10/11/2017 01/28/2017 12/30/2016 04/28/2016 11/28/2015 04/04/2015 08/20/2014  Systolic BP 128 140 132 122 118 124 117  Diastolic BP 84 92 80 80 78 82 80  Wt. (Lbs) 196 187 - 185 175 195 -  BMI 31.64 30.18 - 29.87 28.26 31.49 -

## 2017-10-13 NOTE — Assessment & Plan Note (Signed)
Improved Hyperlipidemia:Low fat diet discussed and encouraged.   Lipid Panel  Lab Results  Component Value Date   CHOL 173 10/09/2017   HDL 43 (L) 10/09/2017   LDLCALC 57 12/14/2016   TRIG 233 (H) 10/09/2017   CHOLHDL 4.0 10/09/2017  Needs to reduce fat and increase exercise

## 2017-10-13 NOTE — Assessment & Plan Note (Signed)
Currently uncontrolled , but improved on gabapentin 100 mg , dose increased to 300 mg total, may take 100 mg in am and 200 mg at bedtime Reviewed lifestyle modifications to deal with symptoms also

## 2017-10-13 NOTE — Progress Notes (Signed)
Meredith Sanchez     MRN: [id number]      DOB: 07/21/1966   HPI Meredith Sanchez is here for follow up and re-evaluation of chronic medical conditions, medication management and review of any available recent lab and radiology data.  Preventive health is updated, specifically  Cancer screening and Immunization.   Questions or concerns regarding consultations or procedures which the PT has had in the interim are  addressed. The PT denies any adverse reactions to current medications since the last visit.  C/o uncontrolled hot flashes disturbing her sleep, has noted some response to gabapentin however  ROS Denies recent fever or chills. Denies sinus pressure, nasal congestion, ear pain or sore throat. Denies chest congestion, productive cough or wheezing. Denies chest pains, palpitations and leg swelling Denies abdominal pain, nausea, vomiting,diarrhea or constipation.   Denies dysuria, frequency, hesitancy or incontinence. Denies joint pain, swelling and limitation in mobility. Denies headaches, seizures, numbness, or tingling. Denies depression, anxiety . Denies skin break down or rash.   PE  BP 128/84   Pulse 76   Resp 16   Ht 5\' 6"  (1.676 m)   Wt 196 lb (88.9 kg)   SpO2 96%   BMI 31.64 kg/m   Patient alert and oriented and in no cardiopulmonary distress.  HEENT: No facial asymmetry, EOMI,   oropharynx pink and moist.  Neck supple no JVD, no mass.  Chest: Clear to auscultation bilaterally.  CVS: S1, S2 no murmurs, no S3.Regular rate.  ABD: Soft non tender.   Ext: No edema  MS: Adequate ROM spine, shoulders, hips and knees.  Skin: Intact, no ulcerations or rash noted.  Psych: Good eye contact, normal affect. Memory intact not anxious or depressed appearing.  CNS: CN 2-12 intact, power,  normal throughout.no focal deficits noted.   Assessment & Plan  Essential hypertension Controlled, no change in medication DASH diet and commitment to daily physical activity for a  minimum of 30 minutes discussed and encouraged, as a part of hypertension management. The importance of attaining a healthy weight is also discussed.  BP/Weight 10/11/2017 01/28/2017 12/30/2016 04/28/2016 11/28/2015 04/04/2015 08/20/2014  Systolic BP 128 140 132 122 118 124 117  Diastolic BP 84 92 80 80 78 82 80  Wt. (Lbs) 196 187 - 185 175 195 -  BMI 31.64 30.18 - 29.87 28.26 31.49 -       Hyperlipidemia LDL goal <100 Improved Hyperlipidemia:Low fat diet discussed and encouraged.   Lipid Panel  Lab Results  Component Value Date   CHOL 173 10/09/2017   HDL 43 (L) 10/09/2017   LDLCALC 57 12/14/2016   TRIG 233 (H) 10/09/2017   CHOLHDL 4.0 10/09/2017  Needs to reduce fat and increase exercise     Prediabetes Deteriorated, drinks excess sodas Patient educated about the importance of limiting  Carbohydrate intake , the need to commit to daily physical activity for a minimum of 30 minutes , and to commit weight loss. The fact that changes in all these areas will reduce or eliminate all together the development of diabetes is stressed.   Diabetic Labs Latest Ref Rng & Units 10/09/2017 12/14/2016 05/12/2016 11/26/2015 04/04/2015  HbA1c <5.7 % of total Hgb 6.1(H) 5.7(H) 6.1(H) 5.8(H) 6.2(H)  Chol <200 mg/dL 244173 010172 272(Z207(H) 366(Y215(H) 403(K202(H)  HDL >50 mg/dL 74(Q43(L) 59(D43(L) 57 63(O44(L) 53  Calc LDL <100 mg/dL - 57 92 756109 433(I108(H)  Triglycerides <150 mg/dL 951(O233(H) 841(Y362(H) 606(T288(H) 016(W309(H) 205(H)  Creatinine 0.50 - 1.05 mg/dL 1.09(N1.15(H) 2.351.01 5.73(U1.17(H) 2.02(R1.13(H)  1.09   BP/Weight 10/11/2017 01/28/2017 12/30/2016 04/28/2016 11/28/2015 04/04/2015 08/20/2014  Systolic BP 128 140 132 122 118 124 117  Diastolic BP 84 92 80 80 78 82 80  Wt. (Lbs) 196 187 - 185 175 195 -  BMI 31.64 30.18 - 29.87 28.26 31.49 -   No flowsheet data found.    Obesity Deteriorated. Patient re-educated about  the importance of commitment to a  minimum of 150 minutes of exercise per week.  The importance of healthy food choices with portion control  discussed. Encouraged to start a food diary, count calories and to consider  joining a support group. Sample diet sheets offered. Goals set by the patient for the next several months.   Weight /BMI 10/11/2017 01/28/2017 04/28/2016  WEIGHT 196 lb 187 lb 185 lb  HEIGHT 5\' 6"  5\' 6"  5\' 6"   BMI 31.64 kg/m2 30.18 kg/m2 29.87 kg/m2      Hot flashes due to surgical menopause Currently uncontrolled , but improved on gabapentin 100 mg , dose increased to 300 mg total, may take 100 mg in am and 200 mg at bedtime Reviewed lifestyle modifications to deal with symptoms also

## 2017-10-13 NOTE — Assessment & Plan Note (Signed)
Deteriorated, drinks excess sodas Patient educated about the importance of limiting  Carbohydrate intake , the need to commit to daily physical activity for a minimum of 30 minutes , and to commit weight loss. The fact that changes in all these areas will reduce or eliminate all together the development of diabetes is stressed.   Diabetic Labs Latest Ref Rng & Units 10/09/2017 12/14/2016 05/12/2016 11/26/2015 04/04/2015  HbA1c <5.7 % of total Hgb 6.1(H) 5.7(H) 6.1(H) 5.8(H) 6.2(H)  Chol <200 mg/dL [id number] 846172 962(X207(H) 528(U215(H) 132(G202(H)  HDL >50 mg/dL 40(N43(L) 02(V43(L) 57 25(D44(L) 53  Calc LDL <100 mg/dL - 57 92 664109 403(K108(H)  Triglycerides <150 mg/dL 742(V233(H) 956(L362(H) 875(I288(H) 433(I309(H) 205(H)  Creatinine 0.50 - 1.05 mg/dL 9.51(O1.15(H) 8.411.01 6.60(Y1.17(H) 3.01(S1.13(H) 1.09   BP/Weight 10/11/2017 01/28/2017 12/30/2016 04/28/2016 11/28/2015 04/04/2015 08/20/2014  Systolic BP 128 140 132 122 118 124 117  Diastolic BP 84 92 80 80 78 82 80  Wt. (Lbs) 196 187 - 185 175 195 -  BMI 31.64 30.18 - 29.87 28.26 31.49 -   No flowsheet data found.

## 2018-01-11 ENCOUNTER — Other Ambulatory Visit: Payer: Self-pay | Admitting: Family Medicine

## 2018-01-11 DIAGNOSIS — E785 Hyperlipidemia, unspecified: Secondary | ICD-10-CM

## 2018-01-12 ENCOUNTER — Telehealth: Payer: Self-pay | Admitting: Family Medicine

## 2018-01-12 NOTE — Telephone Encounter (Signed)
Pt dropped these off Friday and dr simpson is not available. These will not be done until she returns

## 2018-01-12 NOTE — Telephone Encounter (Signed)
Pt is calling regarding her Long term Dis papers.. Please advise. They are not up front

## 2018-01-14 ENCOUNTER — Telehealth: Payer: Self-pay

## 2018-01-14 NOTE — Telephone Encounter (Signed)
Pt calling to see is her disability paperwork is done by Dr Lodema HongSimpson.

## 2018-01-17 NOTE — Telephone Encounter (Signed)
No, Dr Lodema Hongsimpson will not be back until the 25th

## 2018-01-26 ENCOUNTER — Telehealth: Payer: Self-pay | Admitting: Family Medicine

## 2018-01-26 NOTE — Telephone Encounter (Signed)
Calling in-regards to her long term disability papers.. Please advise she dropped them off like 3 weeks ago..[phone]

## 2018-01-27 NOTE — Telephone Encounter (Signed)
Form ready, patient aware

## 2018-01-27 NOTE — Telephone Encounter (Signed)
I have completed the form, I have printed the EKG, please print the last 3 notes, the lab an the CCUA that is available from several years ago and let pt know, thanks Form is in your area

## 2018-01-27 NOTE — Telephone Encounter (Signed)
Patient aware to come sign form so it can be faxed in

## 2018-01-27 NOTE — Telephone Encounter (Signed)
I firsrt saw them 4 days ago when I returned and I have them and am working on them

## 2018-01-28 DIAGNOSIS — I10 Essential (primary) hypertension: Secondary | ICD-10-CM

## 2018-02-10 ENCOUNTER — Encounter: Payer: Self-pay | Admitting: Family Medicine

## 2018-02-11 LAB — LIPID PANEL
CHOLESTEROL: 173 mg/dL (ref <200)
HDL: 45 mg/dL — ABNORMAL LOW (ref >50)
LDL Cholesterol (Calc): 93 mg/dL (calc)
NON-HDL CHOLESTEROL (CALC): 128 mg/dL (ref <130)
TRIGLYCERIDES: 263 mg/dL — AB (ref <150)
Total CHOL/HDL Ratio: 3.8 (calc) (ref <5.0)

## 2018-02-11 LAB — BASIC METABOLIC PANEL
BUN: 22 mg/dL (ref 7–25)
CALCIUM: 10.4 mg/dL (ref 8.6–10.4)
CHLORIDE: 100 mmol/L (ref 98–110)
CO2: 30 mmol/L (ref 20–32)
Creat: 1.04 mg/dL (ref 0.50–1.05)
GLUCOSE: 118 mg/dL — AB (ref 65–99)
Potassium: 3.9 mmol/L (ref 3.5–5.3)
Sodium: 139 mmol/L (ref 135–146)

## 2018-02-11 LAB — HEMOGLOBIN A1C
HEMOGLOBIN A1C: 6.4 %{Hb} — AB (ref <5.7)
Mean Plasma Glucose: 137 (calc)
eAG (mmol/L): 7.6 (calc)

## 2018-02-14 ENCOUNTER — Ambulatory Visit (INDEPENDENT_AMBULATORY_CARE_PROVIDER_SITE_OTHER): Payer: BLUE CROSS/BLUE SHIELD | Admitting: Family Medicine

## 2018-02-14 ENCOUNTER — Encounter: Payer: Self-pay | Admitting: Family Medicine

## 2018-02-14 VITALS — BP 120/82 | HR 86 | Resp 16 | Ht 66.0 in | Wt 196.0 lb

## 2018-02-14 DIAGNOSIS — E6609 Other obesity due to excess calories: Secondary | ICD-10-CM

## 2018-02-14 DIAGNOSIS — R7303 Prediabetes: Secondary | ICD-10-CM | POA: Diagnosis not present

## 2018-02-14 DIAGNOSIS — J309 Allergic rhinitis, unspecified: Secondary | ICD-10-CM

## 2018-02-14 DIAGNOSIS — I10 Essential (primary) hypertension: Secondary | ICD-10-CM

## 2018-02-14 DIAGNOSIS — E785 Hyperlipidemia, unspecified: Secondary | ICD-10-CM | POA: Diagnosis not present

## 2018-02-14 DIAGNOSIS — Z6831 Body mass index (BMI) 31.0-31.9, adult: Secondary | ICD-10-CM

## 2018-02-14 DIAGNOSIS — R928 Other abnormal and inconclusive findings on diagnostic imaging of breast: Secondary | ICD-10-CM | POA: Diagnosis not present

## 2018-02-14 DIAGNOSIS — E8881 Metabolic syndrome: Secondary | ICD-10-CM | POA: Diagnosis not present

## 2018-02-14 MED ORDER — PREDNISONE 5 MG PO TABS: 5.0000 mg | ORAL_TABLET | Freq: Two times a day (BID) | ORAL | 0 refills | Status: AC

## 2018-02-14 MED ORDER — METHYLPREDNISOLONE ACETATE 80 MG/ML IJ SUSP
80.0000 mg | Freq: Once | INTRAMUSCULAR | Status: AC
  Administered 2018-02-14: 80 mg via INTRAMUSCULAR

## 2018-02-14 NOTE — Patient Instructions (Addendum)
F/U iin 5.5 months, call if you need me before  Fasting lipid, cmp and eGFr and HBA1C  Please change eating so your health improves, triglyceride and blood sugar is too high  Commit to regular allergy meds  Return 3 stool cards   Please call eye  Specialist re watery eyes  Thank you  for choosing [facility]. We consider it a privelige to serve you.  Delivering excellent health care in a caring and  compassionate way is our goal.  Partnering with you,  so that together we can achieve this goal is our strategy.

## 2018-02-14 NOTE — Progress Notes (Signed)
Meredith Sanchez     MRN: [id number]      DOB: February 20, 1966   HPI Ms. Varnell is here for follow up and re-evaluation of chronic medical conditions, medication management and review of any available recent lab and radiology data.  Preventive health is updated, specifically  Cancer screening and Immunization.   Questions or concerns regarding consultations or procedures which the PT has had in the interim are  Addressed.Recently saw eye specialist re excess tearing , was diagnosed with dry eyes , her symptoms are no better, and medication prescribed is too expensive. I have advised her to call back   Had pink eye in January and c/o excess watery eyes since Feb, was dx with dry weye by specilaist, got samples of medication unable to fill as too expensive Appt originally scheduled as a physical , she is refusing  Mammogram is upcoming No pelvic exam indicated , and she will return stool cards, denies any GI symptoms and  has no personal h/o suspicious colon polyps or f/h of colon cancer ROS Denies recent fever or chills. Denies sinus pressure, nasal congestion, ear pain or sore throat. Denies chest congestion, productive cough or wheezing. Denies chest pains, palpitations and leg swelling Denies abdominal pain, nausea, vomiting,diarrhea or constipation.   Denies dysuria, frequency, hesitancy or incontinence. Denies joint pain, swelling and limitation in mobility. Denies headaches, seizures, numbness, or tingling. Denies depression, anxiety or insomnia. Denies skin break down or rash.   PE  BP 120/82   Pulse 86   Resp 16   Ht 5\' 6"  (1.676 m)   Wt 196 lb (88.9 kg)   SpO2 94%   BMI 31.64 kg/m   Patient alert and oriented and in no cardiopulmonary distress.  HEENT: No facial asymmetry, EOMI,   oropharynx pink and moist.  Neck supple no JVD, no mass.  Chest: Clear to auscultation bilaterally.  CVS: S1, S2 no murmurs, no S3.Regular rate.  ABD: Soft non tender.   Ext: No edema  MS:  Adequate ROM spine, shoulders, hips and knees.  Skin: Intact, no ulcerations or rash noted.  Psych: Good eye contact, normal affect. Memory intact not anxious or depressed appearing.  CNS: CN 2-12 intact, power,  normal throughout.no focal deficits noted.   Assessment & Plan  Essential hypertension Controlled, no change in medication DASH diet and commitment to daily physical activity for a minimum of 30 minutes discussed and encouraged, as a part of hypertension management. The importance of attaining a healthy weight is also discussed.  BP/Weight 02/14/2018 10/11/2017 01/28/2017 12/30/2016 04/28/2016 11/28/2015 04/04/2015  Systolic BP 120 128 140 132 122 118 124  Diastolic BP 82 84 92 80 80 78 82  Wt. (Lbs) 196 196 187 - 185 175 195  BMI 31.64 31.64 30.18 - 29.87 28.26 31.49       Hyperlipidemia LDL goal <100 Hyperlipidemia:Low fat diet discussed and encouraged.   Lipid Panel  Lab Results  Component Value Date   CHOL 173 02/10/2018   HDL 45 (L) 02/10/2018   LDLCALC 93 02/10/2018   TRIG 263 (H) 02/10/2018   CHOLHDL 3.8 02/10/2018   Needs to reduce cheese , butter and egg yolk, also increase exercise '  Prediabetes Patient educated about the importance of limiting  Carbohydrate intake , the need to commit to daily physical activity for a minimum of 30 minutes , and to commit weight loss. The fact that changes in all these areas will reduce or eliminate all together the development of  diabetes is stressed.  Deteriorated, need sto change food choice , f/u nin 5 months  Diabetic Labs Latest Ref Rng & Units 02/10/2018 10/09/2017 12/14/2016 05/12/2016 11/26/2015  HbA1c <5.7 % of total Hgb 6.4(H) 6.1(H) 5.7(H) 6.1(H) 5.8(H)  Chol <200 mg/dL 086173 578173 469172 629(B207(H) 284(X215(H)  HDL >50 mg/dL 32(G45(L) 40(N43(L) 02(V43(L) 57 25(D44(L)  Calc LDL mg/dL (calc) 93 95 57 92 [id number]  Triglycerides <150 mg/dL 403(K263(H) 742(V233(H) 956(L362(H) 875(I288(H) 309(H)  Creatinine 0.50 - 1.05 mg/dL 4.331.04 2.95(J1.15(H) 8.841.01 1.66(A1.17(H) 1.13(H)    BP/Weight 02/14/2018 10/11/2017 01/28/2017 12/30/2016 04/28/2016 11/28/2015 04/04/2015  Systolic BP 120 128 140 132 122 118 124  Diastolic BP 82 84 92 80 80 78 82  Wt. (Lbs) 196 196 187 - 185 175 195  BMI 31.64 31.64 30.18 - 29.87 28.26 31.49   No flowsheet data found.    Metabolic syndrome X The increased risk of cardiovascular disease associated with this diagnosis, and the need to consistently work on lifestyle to change this is discussed. Following  a  heart healthy diet ,commitment to 30 minutes of exercise at least 5 days per week, as well as control of blood sugar and cholesterol , and achieving a healthy weight are all the areas to be addressed .   Obesity Unchaged. Patient re-educated about  the importance of commitment to a  minimum of 150 minutes of exercise per week.  The importance of healthy food choices with portion control discussed. Encouraged to start a food diary, count calories and to consider  joining a support group. Sample diet sheets offered. Goals set by the patient for the next several months.   Weight /BMI 02/14/2018 10/11/2017 01/28/2017  WEIGHT 196 lb 196 lb 187 lb  HEIGHT 5\' 6"  5\' 6"  5\' 6"   BMI 31.64 kg/m2 31.64 kg/m2 30.18 kg/m2

## 2018-02-17 DIAGNOSIS — Z1211 Encounter for screening for malignant neoplasm of colon: Secondary | ICD-10-CM

## 2018-02-17 LAB — HEMOCCULT GUIAC POC 1CARD (OFFICE)
Card #2 Fecal Occult Blod, POC: NEGATIVE
Card #3 Fecal Occult Blood, POC: NEGATIVE
FECAL OCCULT BLD: NEGATIVE

## 2018-02-20 ENCOUNTER — Encounter: Payer: Self-pay | Admitting: Family Medicine

## 2018-02-20 NOTE — Assessment & Plan Note (Signed)
The increased risk of cardiovascular disease associated with this diagnosis, and the need to consistently work on lifestyle to change this is discussed. Following  a  heart healthy diet ,commitment to 30 minutes of exercise at least 5 days per week, as well as control of blood sugar and cholesterol , and achieving a healthy weight are all the areas to be addressed .  

## 2018-02-20 NOTE — Assessment & Plan Note (Signed)
Controlled, no change in medication DASH diet and commitment to daily physical activity for a minimum of 30 minutes discussed and encouraged, as a part of hypertension management. The importance of attaining a healthy weight is also discussed.  BP/Weight 02/14/2018 10/11/2017 01/28/2017 12/30/2016 04/28/2016 11/28/2015 04/04/2015  Systolic BP 120 128 140 132 122 118 124  Diastolic BP 82 84 92 80 80 78 82  Wt. (Lbs) 196 196 187 - 185 175 195  BMI 31.64 31.64 30.18 - 29.87 28.26 31.49

## 2018-02-20 NOTE — Assessment & Plan Note (Addendum)
Unchaged. Patient re-educated about  the importance of commitment to a  minimum of 150 minutes of exercise per week.  The importance of healthy food choices with portion control discussed. Encouraged to start a food diary, count calories and to consider  joining a support group. Sample diet sheets offered. Goals set by the patient for the next several months.   Weight /BMI 02/14/2018 10/11/2017 01/28/2017  WEIGHT 196 lb 196 lb 187 lb  HEIGHT 5\' 6"  5\' 6"  5\' 6"   BMI 31.64 kg/m2 31.64 kg/m2 30.18 kg/m2

## 2018-02-20 NOTE — Assessment & Plan Note (Addendum)
Patient educated about the importance of limiting  Carbohydrate intake , the need to commit to daily physical activity for a minimum of 30 minutes , and to commit weight loss. The fact that changes in all these areas will reduce or eliminate all together the development of diabetes is stressed.  Deteriorated, need sto change food choice , f/u nin 5 months  Diabetic Labs Latest Ref Rng & Units 02/10/2018 10/09/2017 12/14/2016 05/12/2016 11/26/2015  HbA1c <5.7 % of total Hgb 6.4(H) 6.1(H) 5.7(H) 6.1(H) 5.8(H)  Chol <200 mg/dL [id number] 096173 045172 409(W207(H) 119(J215(H)  HDL >50 mg/dL 47(W45(L) 29(F43(L) 62(Z43(L) 57 30(Q44(L)  Calc LDL mg/dL (calc) 93 95 57 92 [id number]  Triglycerides <150 mg/dL 846(N263(H) 629(B233(H) 284(X362(H) 324(M288(H) 309(H)  Creatinine 0.50 - 1.05 mg/dL 0.101.04 2.72(Z1.15(H) 3.661.01 4.40(H1.17(H) 1.13(H)   BP/Weight 02/14/2018 10/11/2017 01/28/2017 12/30/2016 04/28/2016 11/28/2015 04/04/2015  Systolic BP 120 128 140 132 122 118 124  Diastolic BP 82 84 92 80 80 78 82  Wt. (Lbs) 196 196 187 - 185 175 195  BMI 31.64 31.64 30.18 - 29.87 28.26 31.49   No flowsheet data found.

## 2018-02-20 NOTE — Assessment & Plan Note (Signed)
Hyperlipidemia:Low fat diet discussed and encouraged.   Lipid Panel  Lab Results  Component Value Date   CHOL 173 02/10/2018   HDL 45 (L) 02/10/2018   LDLCALC 93 02/10/2018   TRIG 263 (H) 02/10/2018   CHOLHDL 3.8 02/10/2018   Needs to reduce cheese , butter and egg yolk, also increase exercise '

## 2018-02-20 NOTE — Assessment & Plan Note (Signed)
Uncontrolled , nees to commit to daily use of medication

## 2018-02-23 ENCOUNTER — Encounter: Payer: Self-pay | Admitting: Family Medicine

## 2018-04-10 ENCOUNTER — Other Ambulatory Visit: Payer: Self-pay | Admitting: Family Medicine

## 2018-04-10 DIAGNOSIS — E785 Hyperlipidemia, unspecified: Secondary | ICD-10-CM

## 2018-07-14 ENCOUNTER — Other Ambulatory Visit: Payer: Self-pay | Admitting: Family Medicine

## 2018-07-14 DIAGNOSIS — E785 Hyperlipidemia, unspecified: Secondary | ICD-10-CM

## 2018-07-18 ENCOUNTER — Ambulatory Visit: Payer: BLUE CROSS/BLUE SHIELD | Admitting: Family Medicine

## 2018-09-09 DIAGNOSIS — E876 Hypokalemia: Secondary | ICD-10-CM | POA: Insufficient documentation

## 2018-09-12 MED ORDER — ATORVASTATIN CALCIUM 80 MG PO TABS
80.00 | ORAL_TABLET | ORAL | Status: DC
Start: 2018-09-12 — End: 2018-09-12

## 2018-09-12 MED ORDER — ONDANSETRON 4 MG PO TBDP
4.00 | ORAL_TABLET | ORAL | Status: DC
Start: ? — End: 2018-09-12

## 2018-09-12 MED ORDER — HEPARIN (PORCINE) IN NACL 100-0.45 UNIT/ML-% IJ SOLN
12.00 | INTRAMUSCULAR | Status: DC
Start: ? — End: 2018-09-12

## 2018-09-12 MED ORDER — POLYETHYLENE GLYCOL 3350 17 G PO PACK
17.00 | PACK | ORAL | Status: DC
Start: 2018-09-13 — End: 2018-09-12

## 2018-09-12 MED ORDER — CARBOXYMETHYLCELLULOSE SODIUM 0.25 % OP SOLN
2.00 | OPHTHALMIC | Status: DC
Start: ? — End: 2018-09-12

## 2018-09-12 MED ORDER — ZOLPIDEM TARTRATE 5 MG PO TABS
5.00 | ORAL_TABLET | ORAL | Status: DC
Start: ? — End: 2018-09-12

## 2018-09-12 MED ORDER — ASPIRIN 81 MG PO CHEW
81.00 | CHEWABLE_TABLET | ORAL | Status: DC
Start: 2018-09-13 — End: 2018-09-12

## 2018-09-12 MED ORDER — CARVEDILOL 6.25 MG PO TABS
6.25 | ORAL_TABLET | ORAL | Status: DC
Start: 2018-09-12 — End: 2018-09-12

## 2018-09-12 MED ORDER — MELATONIN 3 MG PO TABS
3.00 | ORAL_TABLET | ORAL | Status: DC
Start: ? — End: 2018-09-12

## 2018-09-12 MED ORDER — HEPARIN SODIUM (PORCINE) 1000 UNIT/ML IJ SOLN
2000.00 | INTRAMUSCULAR | Status: DC
Start: ? — End: 2018-09-12

## 2018-09-12 MED ORDER — ACETAMINOPHEN 325 MG PO TABS
650.00 | ORAL_TABLET | ORAL | Status: DC
Start: ? — End: 2018-09-12

## 2018-09-20 ENCOUNTER — Telehealth: Payer: Self-pay

## 2018-09-20 NOTE — Telephone Encounter (Signed)
Benedryl not working, still itching. Wants an appointment but has no insurance at this time. Any suggestions?

## 2018-09-20 NOTE — Telephone Encounter (Signed)
Patient called with a complaint of itching on her arms, legs and torso. Denies rash, denies labored breathing, denies discoloration of skin. Stated that she had started three new medications last week after a heart attack. The prescriptions were Norvasc, Lipitor and ASA 81mg  daily. Was concerned that one of these new medications maybe the cause. Stated she had already taken one benedryl hours earlier that helped some, but itching was returning. Advised patient to take another Benedryl, and if new symptoms arise please return call, or if breathing becomes labored to call 911.

## 2018-09-26 ENCOUNTER — Telehealth: Payer: Self-pay

## 2018-09-26 NOTE — Telephone Encounter (Signed)
.  tran

## 2018-10-12 ENCOUNTER — Other Ambulatory Visit: Payer: Self-pay | Admitting: Family Medicine

## 2018-12-08 DIAGNOSIS — Z6829 Body mass index (BMI) 29.0-29.9, adult: Secondary | ICD-10-CM | POA: Diagnosis not present

## 2018-12-08 DIAGNOSIS — Z1151 Encounter for screening for human papillomavirus (HPV): Secondary | ICD-10-CM | POA: Diagnosis not present

## 2018-12-08 DIAGNOSIS — Z01419 Encounter for gynecological examination (general) (routine) without abnormal findings: Secondary | ICD-10-CM | POA: Diagnosis not present

## 2018-12-08 DIAGNOSIS — E785 Hyperlipidemia, unspecified: Secondary | ICD-10-CM | POA: Diagnosis not present

## 2018-12-11 DIAGNOSIS — R42 Dizziness and giddiness: Secondary | ICD-10-CM | POA: Diagnosis not present

## 2019-01-13 DIAGNOSIS — H04123 Dry eye syndrome of bilateral lacrimal glands: Secondary | ICD-10-CM | POA: Diagnosis not present

## 2019-04-14 DIAGNOSIS — H04123 Dry eye syndrome of bilateral lacrimal glands: Secondary | ICD-10-CM | POA: Diagnosis not present

## 2019-07-24 ENCOUNTER — Telehealth: Payer: Self-pay | Admitting: Family Medicine

## 2019-07-24 ENCOUNTER — Other Ambulatory Visit: Payer: Self-pay

## 2019-07-24 ENCOUNTER — Ambulatory Visit (INDEPENDENT_AMBULATORY_CARE_PROVIDER_SITE_OTHER): Payer: BC Managed Care – PPO | Admitting: Family Medicine

## 2019-07-24 ENCOUNTER — Encounter: Payer: Self-pay | Admitting: Family Medicine

## 2019-07-24 VITALS — BP 118/80 | HR 76 | Temp 97.8°F | Resp 15 | Ht 66.0 in | Wt 175.0 lb

## 2019-07-24 DIAGNOSIS — R200 Anesthesia of skin: Secondary | ICD-10-CM

## 2019-07-24 DIAGNOSIS — I10 Essential (primary) hypertension: Secondary | ICD-10-CM | POA: Diagnosis not present

## 2019-07-24 DIAGNOSIS — E663 Overweight: Secondary | ICD-10-CM

## 2019-07-24 DIAGNOSIS — Z23 Encounter for immunization: Secondary | ICD-10-CM | POA: Diagnosis not present

## 2019-07-24 DIAGNOSIS — E559 Vitamin D deficiency, unspecified: Secondary | ICD-10-CM

## 2019-07-24 DIAGNOSIS — E785 Hyperlipidemia, unspecified: Secondary | ICD-10-CM | POA: Diagnosis not present

## 2019-07-24 DIAGNOSIS — R202 Paresthesia of skin: Secondary | ICD-10-CM

## 2019-07-24 DIAGNOSIS — K649 Unspecified hemorrhoids: Secondary | ICD-10-CM

## 2019-07-24 DIAGNOSIS — R7301 Impaired fasting glucose: Secondary | ICD-10-CM | POA: Diagnosis not present

## 2019-07-24 DIAGNOSIS — D539 Nutritional anemia, unspecified: Secondary | ICD-10-CM

## 2019-07-24 MED ORDER — HYDROCORT-PRAMOXINE (PERIANAL) 1-1 % EX FOAM: 1.0000 | Freq: Two times a day (BID) | CUTANEOUS | 3 refills | Status: AC

## 2019-07-24 NOTE — Patient Instructions (Addendum)
Annual exam with pap in January with MD, call if you need me before. Shingrix # 1 at visit  Pls help with my chart log in at discharge  Flu vaccine today  CBC, fasting lipid, cmp and EGFr, HBa1C , TSH and vit D and Vit B12  level Today  For your 11 month h/o bilateral foot numbness and tingling at night, if B12 level is normal, I recommend Podiatry to fully evaluate   Please schedule mammogram at [facility] , currently due  It is important that you exercise regularly at least 30 minutes 5 times a week. If you develop chest pain, have severe difficulty breathing, or feel very tired, stop exercising immediately and seek medical attention     Think about what you will eat, plan ahead.   Proctofoam/ cort is prescribed for hemorrhoids, please cut back on cheese  Choose " clean, green, fresh or frozen" over canned, processed or packaged foods which are more sugary, salty and fatty. 70 to 75% of food eaten should be vegetables and fruit. Three meals at set times with snacks allowed between meals, but they must be fruit or vegetables. Aim to eat over a 12 hour period , example 7 am to 7 pm, and STOP after  your last meal of the day. Drink water,generally about 64 ounces per day, no other drink is as healthy. Fruit juice is best enjoyed in a healthy way, by EATING the fruit.  Thanks for choosing [facility], we consider it a privelige to serve you.

## 2019-07-24 NOTE — Telephone Encounter (Signed)
Pt stated on one rx called in should have been 3

## 2019-07-25 ENCOUNTER — Other Ambulatory Visit: Payer: Self-pay | Admitting: Family Medicine

## 2019-07-25 ENCOUNTER — Telehealth: Payer: Self-pay

## 2019-07-25 LAB — COMPLETE METABOLIC PANEL WITH GFR
AG Ratio: 1.6 (calc) (ref 1.0–2.5)
ALT: 24 U/L (ref 6–29)
AST: 26 U/L (ref 10–35)
Albumin: 4.7 g/dL (ref 3.6–5.1)
Alkaline phosphatase (APISO): 51 U/L (ref 37–153)
BUN: 20 mg/dL (ref 7–25)
CO2: 31 mmol/L (ref 20–32)
Calcium: 10.3 mg/dL (ref 8.6–10.4)
Chloride: 101 mmol/L (ref 98–110)
Creat: 0.94 mg/dL (ref 0.50–1.05)
GFR, Est African American: 80 mL/min/{1.73_m2} (ref >=60)
GFR, Est Non African American: 69 mL/min/{1.73_m2} (ref >=60)
Globulin: 2.9 g/dL (calc) (ref 1.9–3.7)
Glucose, Bld: 105 mg/dL — ABNORMAL HIGH (ref 65–99)
Potassium: 3.7 mmol/L (ref 3.5–5.3)
Sodium: 140 mmol/L (ref 135–146)
Total Bilirubin: 0.7 mg/dL (ref 0.2–1.2)
Total Protein: 7.6 g/dL (ref 6.1–8.1)

## 2019-07-25 LAB — LIPID PANEL
Cholesterol: 151 mg/dL (ref <200)
HDL: 49 mg/dL — ABNORMAL LOW (ref >=50)
LDL Cholesterol (Calc): 75 mg/dL (calc)
Non-HDL Cholesterol (Calc): 102 mg/dL (calc) (ref <130)
Total CHOL/HDL Ratio: 3.1 (calc) (ref <5.0)
Triglycerides: 173 mg/dL — ABNORMAL HIGH (ref <150)

## 2019-07-25 LAB — VITAMIN B12: Vitamin B-12: 668 pg/mL (ref 200–1100)

## 2019-07-25 LAB — CBC
HCT: 39.8 % (ref 35.0–45.0)
Hemoglobin: 13.7 g/dL (ref 11.7–15.5)
MCH: 32.8 pg (ref 27.0–33.0)
MCHC: 34.4 g/dL (ref 32.0–36.0)
MCV: 95.2 fL (ref 80.0–100.0)
MPV: 10.1 fL (ref 7.5–12.5)
Platelets: 381 10*3/uL (ref 140–400)
RBC: 4.18 10*6/uL (ref 3.80–5.10)
RDW: 13.9 % (ref 11.0–15.0)
WBC: 5 10*3/uL (ref 3.8–10.8)

## 2019-07-25 LAB — HEMOGLOBIN A1C
Hgb A1c MFr Bld: 5.6 % of total Hgb (ref <5.7)
Mean Plasma Glucose: 114 (calc)
eAG (mmol/L): 6.3 (calc)

## 2019-07-25 LAB — VITAMIN D 25 HYDROXY (VIT D DEFICIENCY, FRACTURES): Vit D, 25-Hydroxy: 34 ng/mL (ref 30–100)

## 2019-07-25 LAB — TSH: TSH: 1.88 mIU/L

## 2019-07-25 MED ORDER — HYDROCORTISONE (PERIANAL) 2.5 % EX CREA: 1.0000 "application " | TOPICAL_CREAM | Freq: Two times a day (BID) | CUTANEOUS | 0 refills | Status: DC

## 2019-07-25 MED ORDER — ATORVASTATIN CALCIUM 80 MG PO TABS: 80.0000 mg | ORAL_TABLET | Freq: Every day | ORAL | 3 refills | Status: DC

## 2019-07-25 MED ORDER — TRIAMTERENE-HCTZ 37.5-25 MG PO TABS: ORAL_TABLET | ORAL | 3 refills | Status: DC

## 2019-07-25 NOTE — Telephone Encounter (Signed)
Prescription sent in per MD's orders

## 2019-07-25 NOTE — Telephone Encounter (Signed)
pls send the cream proctocort , twice daily, I will sign, thanks

## 2019-07-25 NOTE — Telephone Encounter (Signed)
pls let her know the other 2 have been sent and revbiew lab result note with her, thank you

## 2019-07-25 NOTE — Telephone Encounter (Signed)
Prescription sent in per MD's orders 

## 2019-07-25 NOTE — Telephone Encounter (Signed)
Patient states the hemorrhoid foam is too expensive. Can you call in the cream she had last time

## 2019-07-29 ENCOUNTER — Encounter: Payer: Self-pay | Admitting: Family Medicine

## 2019-07-29 DIAGNOSIS — R202 Paresthesia of skin: Secondary | ICD-10-CM | POA: Insufficient documentation

## 2019-07-29 DIAGNOSIS — R200 Anesthesia of skin: Secondary | ICD-10-CM | POA: Insufficient documentation

## 2019-07-29 DIAGNOSIS — E66811 Obesity, class 1: Secondary | ICD-10-CM | POA: Insufficient documentation

## 2019-07-29 DIAGNOSIS — E663 Overweight: Secondary | ICD-10-CM | POA: Insufficient documentation

## 2019-07-29 DIAGNOSIS — K649 Unspecified hemorrhoids: Secondary | ICD-10-CM | POA: Insufficient documentation

## 2019-07-29 NOTE — Assessment & Plan Note (Signed)
Controlled, no change in medication DASH diet and commitment to daily physical activity for a minimum of 30 minutes discussed and encouraged, as a part of hypertension management. The importance of attaining a healthy weight is also discussed.  BP/Weight 07/24/2019 02/14/2018 10/11/2017 01/28/2017 12/30/2016 04/28/2016 88/41/6606  Systolic BP 301 601 093 235 573 220 254  Diastolic BP 80 82 84 92 80 80 78  Wt. (Lbs) 175 196 196 187 - 185 175  BMI 28.25 31.64 31.64 30.18 - 29.87 28.26

## 2019-07-29 NOTE — Assessment & Plan Note (Signed)
11 month history, worse at night, refer to Podiatry

## 2019-07-29 NOTE — Assessment & Plan Note (Signed)
Improved, she is congratulated on this  Patient re-educated about  the importance of commitment to a  minimum of 150 minutes of exercise per week as able.  The importance of healthy food choices with portion control discussed, as well as eating regularly and within a 12 hour window most days. The need to choose "clean , green" food 50 to 75% of the time is discussed, as well as to make water the primary drink and set a goal of 64 ounces water daily.    Weight /BMI 07/24/2019 02/14/2018 10/11/2017  WEIGHT 175 lb 196 lb 196 lb  HEIGHT 5\' 6"  5\' 6"  5\' 6"   BMI 28.25 kg/m2 31.64 kg/m2 31.64 kg/m2

## 2019-07-29 NOTE — Progress Notes (Signed)
Meredith Sanchez     MRN: [id number]      DOB: Dec 02, 1965   HPI [name] is here for follow up and re-evaluation of chronic medical conditions, medication management and review of any available recent lab and radiology data.  Preventive health is updated, specifically  Cancer screening and Immunization.   Questions or concerns regarding consultations or procedures which the PT has had in the interim are  Addressed.Was admitted at [facility] for syncopal event , and MI ruled out , had full cardiology eval C/o bilateral numbness in feet x 11 months C/o hemorrhoid pain and attributes hard stool to excess cheese The PT denies any adverse reactions to current medications since the last visit.  There are no new concerns.  There are no specific complaints   ROS Denies recent fever or chills. Denies sinus pressure, nasal congestion, ear pain or sore throat. Denies chest congestion, productive cough or wheezing. Denies chest pains, palpitations and leg swelling Denies abdominal pain, nausea, vomiting,diarrhea or constipation.   Denies dysuria, frequency, hesitancy or incontinence. Denies joint pain, swelling and limitation in mobility. Denies headaches, seizures, numbness, or tingling. Denies depression, anxiety or insomnia. Denies skin break down or rash.   PE  BP 118/80   Pulse 76   Temp 97.8 F (36.6 C) (Temporal)   Resp 15   Ht 5\' 6"  (1.676 m)   Wt 175 lb (79.4 kg)   SpO2 97%   BMI 28.25 kg/m   Patient alert and oriented and in no cardiopulmonary distress.  HEENT: No facial asymmetry, EOMI,   oropharynx pink and moist.  Neck supple no JVD, no mass.  Chest: Clear to auscultation bilaterally.  CVS: S1, S2 no murmurs, no S3.Regular rate.  ABD: Soft non tender.   Ext: No edema  MS: Adequate ROM spine, shoulders, hips and knees.  Skin: Intact, no ulcerations or rash noted.  Psych: Good eye contact, normal affect. Memory intact not anxious or depressed appearing.  CNS: CN  2-12 intact, power,  normal throughout.no focal deficits noted.   Assessment & Plan  Essential hypertension Controlled, no change in medication DASH diet and commitment to daily physical activity for a minimum of 30 minutes discussed and encouraged, as a part of hypertension management. The importance of attaining a healthy weight is also discussed.  BP/Weight 07/24/2019 02/14/2018 10/11/2017 01/28/2017 12/30/2016 04/28/2016 76/73/4193  Systolic BP 790 240 973 532 992 426 834  Diastolic BP 80 82 84 92 80 80 78  Wt. (Lbs) 175 196 196 187 - 185 175  BMI 28.25 31.64 31.64 30.18 - 29.87 28.26       Hyperlipidemia LDL goal <100 Hyperlipidemia:Low fat diet discussed and encouraged.   Lipid Panel  Lab Results  Component Value Date   CHOL 151 07/24/2019   HDL 49 (L) 07/24/2019   LDLCALC 75 07/24/2019   TRIG 173 (H) 07/24/2019   CHOLHDL 3.1 07/24/2019   Needs to increase exercise and reduce fat intake    Overweight (BMI 25.0-29.9) Improved, she is congratulated on this  Patient re-educated about  the importance of commitment to a  minimum of 150 minutes of exercise per week as able.  The importance of healthy food choices with portion control discussed, as well as eating regularly and within a 12 hour window most days. The need to choose "clean , green" food 50 to 75% of the time is discussed, as well as to make water the primary drink and set a goal of 64 ounces water  daily.    Weight /BMI 07/24/2019 02/14/2018 10/11/2017  WEIGHT 175 lb 196 lb 196 lb  HEIGHT 5\' 6"  5\' 6"  5\' 6"   BMI 28.25 kg/m2 31.64 kg/m2 31.64 kg/m2      Hemorrhoids Symptomatic, change in diet , use of stool softener and proctofoam  Numbness and tingling of foot 11 month history, worse at night, refer to Podiatry

## 2019-07-29 NOTE — Assessment & Plan Note (Signed)
Hyperlipidemia:Low fat diet discussed and encouraged.   Lipid Panel  Lab Results  Component Value Date   CHOL 151 07/24/2019   HDL 49 (L) 07/24/2019   LDLCALC 75 07/24/2019   TRIG 173 (H) 07/24/2019   CHOLHDL 3.1 07/24/2019   Needs to increase exercise and reduce fat intake

## 2019-07-29 NOTE — Assessment & Plan Note (Signed)
Symptomatic, change in diet , use of stool softener and proctofoam

## 2019-10-11 DIAGNOSIS — N6310 Unspecified lump in the right breast, unspecified quadrant: Secondary | ICD-10-CM | POA: Diagnosis not present

## 2019-10-11 DIAGNOSIS — R922 Inconclusive mammogram: Secondary | ICD-10-CM | POA: Diagnosis not present

## 2019-10-11 DIAGNOSIS — R921 Mammographic calcification found on diagnostic imaging of breast: Secondary | ICD-10-CM | POA: Diagnosis not present

## 2019-10-25 ENCOUNTER — Other Ambulatory Visit: Payer: Self-pay

## 2019-10-25 MED ORDER — AMLODIPINE BESYLATE 5 MG PO TABS: 5.0000 mg | ORAL_TABLET | Freq: Every day | ORAL | 2 refills | Status: DC

## 2019-10-30 ENCOUNTER — Other Ambulatory Visit: Payer: Self-pay

## 2019-10-30 ENCOUNTER — Telehealth: Payer: Self-pay

## 2019-10-30 MED ORDER — AMLODIPINE BESYLATE 5 MG PO TABS: 5.0000 mg | ORAL_TABLET | Freq: Every day | ORAL | 1 refills | Status: DC

## 2019-10-30 NOTE — Telephone Encounter (Signed)
Amlodipine sent to [facility] in [place] for a 90 days supply per patient request

## 2019-10-30 NOTE — Telephone Encounter (Signed)
Pt wanted a 90 day supply of medication not 30--please call pt

## 2019-12-25 ENCOUNTER — Encounter: Payer: BC Managed Care – PPO | Admitting: Family Medicine

## 2020-01-05 ENCOUNTER — Telehealth: Payer: Self-pay

## 2020-01-05 NOTE — Telephone Encounter (Signed)
Pt needs for [place] to send labs over

## 2020-01-08 ENCOUNTER — Telehealth: Payer: Self-pay

## 2020-01-08 DIAGNOSIS — I10 Essential (primary) hypertension: Secondary | ICD-10-CM

## 2020-01-08 DIAGNOSIS — R7301 Impaired fasting glucose: Secondary | ICD-10-CM

## 2020-01-08 DIAGNOSIS — E785 Hyperlipidemia, unspecified: Secondary | ICD-10-CM

## 2020-01-08 NOTE — Telephone Encounter (Signed)
Labs ordered.

## 2020-01-08 NOTE — Telephone Encounter (Signed)
Which labs would you like for her to have drawn?

## 2020-01-08 NOTE — Telephone Encounter (Signed)
PLS FASTING LIPID, CMP AND Egfr AND Hba1c, THANKS

## 2020-01-10 DIAGNOSIS — R7301 Impaired fasting glucose: Secondary | ICD-10-CM | POA: Diagnosis not present

## 2020-01-10 DIAGNOSIS — I10 Essential (primary) hypertension: Secondary | ICD-10-CM | POA: Diagnosis not present

## 2020-01-10 DIAGNOSIS — E785 Hyperlipidemia, unspecified: Secondary | ICD-10-CM | POA: Diagnosis not present

## 2020-01-11 LAB — COMPLETE METABOLIC PANEL WITH GFR
AG Ratio: 1.4 (calc) (ref 1.0–2.5)
ALT: 35 U/L — ABNORMAL HIGH (ref 6–29)
AST: 34 U/L (ref 10–35)
Albumin: 4.6 g/dL (ref 3.6–5.1)
Alkaline phosphatase (APISO): 52 U/L (ref 37–153)
BUN: 22 mg/dL (ref 7–25)
CO2: 26 mmol/L (ref 20–32)
Calcium: 10.3 mg/dL (ref 8.6–10.4)
Chloride: 99 mmol/L (ref 98–110)
Creat: 0.81 mg/dL (ref 0.50–1.05)
GFR, Est African American: 95 mL/min/{1.73_m2} (ref >=60)
GFR, Est Non African American: 82 mL/min/{1.73_m2} (ref >=60)
Globulin: 3.4 g/dL (calc) (ref 1.9–3.7)
Glucose, Bld: 102 mg/dL (ref 65–139)
Potassium: 4.3 mmol/L (ref 3.5–5.3)
Sodium: 137 mmol/L (ref 135–146)
Total Bilirubin: 0.9 mg/dL (ref 0.2–1.2)
Total Protein: 8 g/dL (ref 6.1–8.1)

## 2020-01-11 LAB — LIPID PANEL
Cholesterol: 186 mg/dL (ref <200)
HDL: 48 mg/dL — ABNORMAL LOW (ref >=50)
LDL Cholesterol (Calc): 103 mg/dL (calc) — ABNORMAL HIGH
Non-HDL Cholesterol (Calc): 138 mg/dL (calc) — ABNORMAL HIGH (ref <130)
Total CHOL/HDL Ratio: 3.9 (calc) (ref <5.0)
Triglycerides: 228 mg/dL — ABNORMAL HIGH (ref <150)

## 2020-01-11 LAB — HEMOGLOBIN A1C
Hgb A1c MFr Bld: 6 % of total Hgb — ABNORMAL HIGH (ref <5.7)
Mean Plasma Glucose: 126 (calc)
eAG (mmol/L): 7 (calc)

## 2020-01-17 ENCOUNTER — Encounter: Payer: BC Managed Care – PPO | Admitting: Family Medicine

## 2020-01-31 ENCOUNTER — Encounter: Payer: BC Managed Care – PPO | Admitting: Family Medicine

## 2020-02-01 ENCOUNTER — Other Ambulatory Visit: Payer: Self-pay

## 2020-02-01 ENCOUNTER — Other Ambulatory Visit: Payer: Self-pay | Admitting: *Deleted

## 2020-02-01 ENCOUNTER — Ambulatory Visit (INDEPENDENT_AMBULATORY_CARE_PROVIDER_SITE_OTHER): Payer: BC Managed Care – PPO | Admitting: Family Medicine

## 2020-02-01 ENCOUNTER — Encounter: Payer: Self-pay | Admitting: Family Medicine

## 2020-02-01 VITALS — BP 132/84 | HR 81 | Temp 97.8°F | Ht 66.0 in | Wt 181.0 lb

## 2020-02-01 DIAGNOSIS — Z0000 Encounter for general adult medical examination without abnormal findings: Secondary | ICD-10-CM

## 2020-02-01 DIAGNOSIS — R748 Abnormal levels of other serum enzymes: Secondary | ICD-10-CM

## 2020-02-01 DIAGNOSIS — E785 Hyperlipidemia, unspecified: Secondary | ICD-10-CM

## 2020-02-01 DIAGNOSIS — I10 Essential (primary) hypertension: Secondary | ICD-10-CM | POA: Diagnosis not present

## 2020-02-01 DIAGNOSIS — E559 Vitamin D deficiency, unspecified: Secondary | ICD-10-CM

## 2020-02-01 DIAGNOSIS — E663 Overweight: Secondary | ICD-10-CM

## 2020-02-01 DIAGNOSIS — R7303 Prediabetes: Secondary | ICD-10-CM

## 2020-02-01 MED ORDER — GABAPENTIN 100 MG PO CAPS: 100.0000 mg | ORAL_CAPSULE | Freq: Every day | ORAL | 3 refills | Status: DC

## 2020-02-01 MED ORDER — ATORVASTATIN CALCIUM 80 MG PO TABS: 80.0000 mg | ORAL_TABLET | Freq: Every day | ORAL | 3 refills | Status: DC

## 2020-02-01 MED ORDER — ATORVASTATIN CALCIUM 80 MG PO TABS: ORAL_TABLET | ORAL | 3 refills | Status: DC

## 2020-02-01 MED ORDER — AMLODIPINE BESYLATE 5 MG PO TABS: 5.0000 mg | ORAL_TABLET | Freq: Every day | ORAL | 1 refills | Status: DC

## 2020-02-01 MED ORDER — TRIAMTERENE-HCTZ 37.5-25 MG PO TABS: ORAL_TABLET | ORAL | 3 refills | Status: DC

## 2020-02-01 NOTE — Patient Instructions (Addendum)
F/U in 6 months,  call if you need me before  Please get non fast hepatic panel in 6 weeks re evaluate elevated LFT  Please get fasting lipid, cmp and EGFR , HBA1C, TSH and vit D and CBC 1 week before visit  Call for Shingrix vaccine 1 month after completing covid vaccines  Increase atorvastatin 80 mg one at bedtime Monday through Thursday, then half at bedtime Friday through Sunday  Reconsider mammogram   It is important that you exercise regularly at least 30 minutes 5 times a week. If you develop chest pain, have severe difficulty breathing, or feel very tired, stop exercising immediately and seek medical attention    Think about what you will eat, plan ahead. Choose " clean, green, fresh or frozen" over canned, processed or packaged foods which are more sugary, salty and fatty. 70 to 75% of food eaten should be vegetables and fruit. Three meals at set times with snacks allowed between meals, but they must be fruit or vegetables. Aim to eat over a 12 hour period , example 7 am to 7 pm, and STOP after  your last meal of the day. Drink water,generally about 64 ounces per day, no other drink is as healthy. Fruit juice is best enjoyed in a healthy way, by EATING the fruit.  Thanks for choosing [facility], we consider it a privelige to serve you.    

## 2020-02-03 NOTE — Assessment & Plan Note (Signed)
Hyperlipidemia:Low fat diet discussed and encouraged.   Lipid Panel  Lab Results  Component Value Date   CHOL 186 01/10/2020   HDL 48 (L) 01/10/2020   LDLCALC 103 (H) 01/10/2020   TRIG 228 (H) 01/10/2020   CHOLHDL 3.9 01/10/2020   npot at [facility] increase lipitor dose , re check LFT in 6 weeks

## 2020-02-03 NOTE — Progress Notes (Signed)
Meredith Sanchez     MRN: [id number]      DOB: [birth year]  HPI: Patient is in for annual physical exam. No other health concerns are expressed or addressed at the visit. Labs are reviewed, med adjustment made for hyperlipidemia  Immunization is reviewed , and  She will call for shinfrix on completion of Covid vaccines.   PE: BP 132/84 (BP Location: Left Arm, Patient Position: Sitting)   Pulse 81   Temp 97.8 F (36.6 C) (Temporal)   Ht 5\' 6"  (1.676 m)   Wt 181 lb (82.1 kg)   SpO2 96%   BMI 29.21 kg/m   Pleasant  female, alert and oriented x 3, in no cardio-pulmonary distress. Afebrile. HEENT No facial trauma or asymetry. Sinuses non tender.  Extra occullar muscles intact.. External ears normal, . Neck: supple, no adenopathy,JVD or thyromegaly.No bruits.  Chest: Clear to ascultation bilaterally.No crackles or wheezes. Non tender to palpation  Breast: No asymetry,no masses or lumps. No tenderness. No nipple discharge or inversion. No axillary or supraclavicular adenopathy  Cardiovascular system; Heart sounds normal,  S1 and  S2 ,no S3.  No murmur, or thrill. Apical beat not displaced Peripheral pulses normal.  Abdomen: Soft, non tender,  Musculoskeletal exam: Full ROM of spine, hips , shoulders and knees. No deformity ,swelling or crepitus noted. No muscle wasting or atrophy.   Neurologic: Cranial nerves 2 to 12 intact. Power, tone ,sensation and reflexes normal throughout. No disturbance in gait. No tremor.  Skin: Intact, no ulceration, erythema , scaling or rash noted. Pigmentation normal throughout  Psych; Normal mood and affect. Judgement and concentration normal   Assessment & Plan:  Annual physical exam Annual exam as documented. Counseling done  re healthy lifestyle involving commitment to 150 minutes exercise per week, heart healthy diet, and attaining healthy weight.The importance of adequate sleep also discussed.  Changes in health habits  are decided on by the patient with goals and time frames  set for achieving them. Immunization and cancer screening needs are specifically addressed at this visit.   Hyperlipidemia LDL goal <100 Hyperlipidemia:Low fat diet discussed and encouraged.   Lipid Panel  Lab Results  Component Value Date   CHOL 186 01/10/2020   HDL 48 (L) 01/10/2020   LDLCALC 103 (H) 01/10/2020   TRIG 228 (H) 01/10/2020   CHOLHDL 3.9 01/10/2020   npot at [facility] increase lipitor dose , re check LFT in 6 weeks    Prediabetes Patient educated about the importance of limiting  Carbohydrate intake , the need to commit to daily physical activity for a minimum of 30 minutes , and to commit weight loss. The fact that changes in all these areas will reduce or eliminate all together the development of diabetes is stressed.  Deteriorated , needs to change food chpoice, eats a lot of fast food  Diabetic Labs Latest Ref Rng & Units 01/10/2020 07/24/2019 02/10/2018 10/09/2017 12/14/2016  HbA1c <5.7 % of total Hgb 6.0(H) 5.6 6.4(H) 6.1(H) 5.7(H)  Chol <200 mg/dL 186 151 173 173 172  HDL > OR = 50 mg/dL 48(L) 49(L) 45(L) 43(L) 43(L)  Calc LDL mg/dL (calc) 103(H) 75 93 95 57  Triglycerides <150 mg/dL 228(H) 173(H) 263(H) 233(H) 362(H)  Creatinine 0.50 - 1.05 mg/dL 0.81 0.94 1.04 1.15(H) 1.01   BP/Weight 02/01/2020 07/24/2019 02/14/2018 10/11/2017 01/28/2017 12/30/2016 8/50/2774  Systolic BP 128 786 767 209 470 962 836  Diastolic BP 84 80 82 84 92 80 80  Wt. (Lbs) 181 175  196 196 187 - 185  BMI 29.21 28.25 31.64 31.64 30.18 - 29.87   No flowsheet data found.    Overweight (BMI 25.0-29.9)  Patient re-educated about  the importance of commitment to a  minimum of 150 minutes of exercise per week as able.  The importance of healthy food choices with portion control discussed, as well as eating regularly and within a 12 hour window most days. The need to choose "clean , green" food 50 to 75% of the time is discussed, as well as  to make water the primary drink and set a goal of 64 ounces water daily.    Weight /BMI 02/01/2020 07/24/2019 02/14/2018  WEIGHT 181 lb 175 lb 196 lb  HEIGHT 5\' 6"  5\' 6"  5\' 6"   BMI 29.21 kg/m2 28.25 kg/m2 31.64 kg/m2

## 2020-02-03 NOTE — Assessment & Plan Note (Signed)
Annual exam as documented. Counseling done  re healthy lifestyle involving commitment to 150 minutes exercise per week, heart healthy diet, and attaining healthy weight.The importance of adequate sleep also discussed. Changes in health habits are decided on by the patient with goals and time frames  set for achieving them. Immunization and cancer screening needs are specifically addressed at this visit. 

## 2020-02-03 NOTE — Assessment & Plan Note (Signed)
  Patient re-educated about  the importance of commitment to a  minimum of 150 minutes of exercise per week as able.  The importance of healthy food choices with portion control discussed, as well as eating regularly and within a 12 hour window most days. The need to choose "clean , green" food 50 to 75% of the time is discussed, as well as to make water the primary drink and set a goal of 64 ounces water daily.    Weight /BMI 02/01/2020 07/24/2019 02/14/2018  WEIGHT 181 lb 175 lb 196 lb  HEIGHT 5\' 6"  5\' 6"  5\' 6"   BMI 29.21 kg/m2 28.25 kg/m2 31.64 kg/m2

## 2020-02-03 NOTE — Assessment & Plan Note (Signed)
Patient educated about the importance of limiting  Carbohydrate intake , the need to commit to daily physical activity for a minimum of 30 minutes , and to commit weight loss. The fact that changes in all these areas will reduce or eliminate all together the development of diabetes is stressed.  Deteriorated , needs to change food chpoice, eats a lot of fast food  Diabetic Labs Latest Ref Rng & Units 01/10/2020 07/24/2019 02/10/2018 10/09/2017 12/14/2016  HbA1c <5.7 % of total Hgb 6.0(H) 5.6 6.4(H) 6.1(H) 5.7(H)  Chol <200 mg/dL 643 142 767 011 003  HDL > OR = 50 mg/dL 49(Y) 11(E) 43(D) 39(N) 43(L)  Calc LDL mg/dL (calc) 225(Y) 75 93 95 57  Triglycerides <150 mg/dL 346(I) 194(F) 125(I) 712(J) 362(H)  Creatinine 0.50 - 1.05 mg/dL 2.90 9.03 0.14 9.96(L) 1.01   BP/Weight 02/01/2020 07/24/2019 02/14/2018 10/11/2017 01/28/2017 12/30/2016 04/28/2016  Systolic BP 132 118 120 128 140 132 122  Diastolic BP 84 80 82 84 92 80 80  Wt. (Lbs) 181 175 196 196 187 - 185  BMI 29.21 28.25 31.64 31.64 30.18 - 29.87   No flowsheet data found.

## 2020-05-11 DIAGNOSIS — J029 Acute pharyngitis, unspecified: Secondary | ICD-10-CM | POA: Diagnosis not present

## 2020-05-11 DIAGNOSIS — J0100 Acute maxillary sinusitis, unspecified: Secondary | ICD-10-CM | POA: Diagnosis not present

## 2020-05-11 DIAGNOSIS — R05 Cough: Secondary | ICD-10-CM | POA: Diagnosis not present

## 2020-05-11 DIAGNOSIS — R0981 Nasal congestion: Secondary | ICD-10-CM | POA: Diagnosis not present

## 2020-05-13 ENCOUNTER — Telehealth: Payer: Self-pay | Admitting: Family Medicine

## 2020-05-13 NOTE — Telephone Encounter (Signed)
Needs urgent care and in office follow up, treated 6/12 Blood pressure high at visit, please arrange f/u in 1 week in office Keep urgent care in [place] folder please

## 2020-05-13 NOTE — Telephone Encounter (Signed)
Pt is scheduled for 6-21 to see you. She is also requesting Cough medication.

## 2020-05-14 ENCOUNTER — Telehealth: Payer: Self-pay

## 2020-05-14 NOTE — Telephone Encounter (Signed)
Pt is calling to see if the Cough medication will be prescribed

## 2020-05-14 NOTE — Telephone Encounter (Signed)
Please advise 

## 2020-05-16 ENCOUNTER — Other Ambulatory Visit: Payer: Self-pay | Admitting: Family Medicine

## 2020-05-16 MED ORDER — BENZONATATE 100 MG PO CAPS: 100.0000 mg | ORAL_CAPSULE | Freq: Two times a day (BID) | ORAL | 0 refills | Status: DC | PRN

## 2020-05-16 NOTE — Telephone Encounter (Signed)
Left generic message for patient to call back.

## 2020-05-16 NOTE — Progress Notes (Signed)
tessalo

## 2020-05-16 NOTE — Telephone Encounter (Signed)
I am unsure what cough med she is talking about. If tessalon perle, then  I have sent  in this, it is a decongestant to help clear chest, please let her know

## 2020-05-17 ENCOUNTER — Telehealth: Payer: Self-pay

## 2020-05-17 NOTE — Telephone Encounter (Signed)
Pt states she doesn't need the appit for Monday , however she needs an inhaler for Bronchitis

## 2020-05-20 ENCOUNTER — Ambulatory Visit: Payer: BC Managed Care – PPO | Admitting: Family Medicine

## 2020-05-20 ENCOUNTER — Other Ambulatory Visit: Payer: Self-pay | Admitting: Family Medicine

## 2020-05-20 MED ORDER — ALBUTEROL SULFATE HFA 108 (90 BASE) MCG/ACT IN AERS: 2.0000 | INHALATION_SPRAY | Freq: Four times a day (QID) | RESPIRATORY_TRACT | 0 refills | Status: DC | PRN

## 2020-05-20 NOTE — Telephone Encounter (Signed)
MED SENT , HOWEVER , PLS LET HER KNOW I AM CONCERNED AS HER BP WAS HIGH IN THE URGENT CARE, WHICH IS WHY I ASKED HER TO COMEIN SOONER THAN SEPT

## 2020-05-20 NOTE — Telephone Encounter (Signed)
I note the response, thank you

## 2020-05-20 NOTE — Telephone Encounter (Signed)
Advised patient medication was sent in. Advised her provider wanted to see her due to elevated bp in [facility]. She stated that she had been taking otc decongestants and that is why it was elevated. She states her BP is fine now.

## 2020-06-04 ENCOUNTER — Telehealth: Payer: Self-pay

## 2020-06-04 NOTE — Telephone Encounter (Signed)
Pt called said she needs another antibiotic and steriods, said she didn't need a visit, as I suggested one.  I advised I would send a note over, and we would call her back with the Dr Recommendations

## 2020-06-05 NOTE — Telephone Encounter (Signed)
Please advise 

## 2020-06-05 NOTE — Telephone Encounter (Signed)
Appointment scheduled with NP

## 2020-06-05 NOTE — Telephone Encounter (Signed)
For antibiotic prescription, patient needs a clinical eval, either here (video/ telephone ot r in office as available)or return to urgent care, pls advise her it is unsafe to take antibiotics if not indicated and without clinical evaluation  evaluation thizs is not safe

## 2020-06-06 ENCOUNTER — Encounter: Payer: Self-pay | Admitting: Family Medicine

## 2020-06-06 ENCOUNTER — Other Ambulatory Visit: Payer: Self-pay

## 2020-06-06 ENCOUNTER — Telehealth (INDEPENDENT_AMBULATORY_CARE_PROVIDER_SITE_OTHER): Payer: BC Managed Care – PPO | Admitting: Family Medicine

## 2020-06-06 VITALS — BP 132/84 | Ht 66.0 in | Wt 181.0 lb

## 2020-06-06 DIAGNOSIS — R058 Other specified cough: Secondary | ICD-10-CM | POA: Insufficient documentation

## 2020-06-06 DIAGNOSIS — R05 Cough: Secondary | ICD-10-CM | POA: Diagnosis not present

## 2020-06-06 MED ORDER — HYDROCOD POLST-CPM POLST ER 10-8 MG/5ML PO SUER: 5.0000 mL | Freq: Two times a day (BID) | ORAL | 0 refills | Status: DC

## 2020-06-06 MED ORDER — ALBUTEROL SULFATE HFA 108 (90 BASE) MCG/ACT IN AERS: 2.0000 | INHALATION_SPRAY | Freq: Four times a day (QID) | RESPIRATORY_TRACT | 1 refills | Status: DC | PRN

## 2020-06-06 NOTE — Progress Notes (Signed)
Virtual Visit via Telephone Note   This visit type was conducted due to national recommendations for restrictions regarding the COVID-19 Pandemic (e.g. social distancing) in an effort to limit this patient's exposure and mitigate transmission in our community.  Due to her co-morbid illnesses, this patient is at least at moderate risk for complications without adequate follow up.  This format is felt to be most appropriate for this patient at this time.  The patient did not have access to video technology/had technical difficulties with video requiring transitioning to audio format only (telephone).  All issues noted in this document were discussed and addressed.  No physical exam could be performed with this format.    Evaluation Performed:  Follow-up visit  Date:  06/06/2020   ID:  Meredith Sanchez, DOB [birth year], MRN [id number]  Patient Location: Home Provider Location: Office  Location of Patient: Home Location of Provider: Telehealth Consent was obtain for visit to be over via telehealth. I verified that I am speaking with the correct person using two identifiers.  PCP:  Kerri Perches, MD   Chief Complaint:  Cough   History of Present Illness:    Meredith Sanchez is a 54 y.o. female with cough and clear phlegm has been going on for 3 weeks. She went to urgent care from [facility] they gave her steroid, antibiotic and tessalon pearls. She has history of bronchitis. Has not had issues in a few years. Has used inhaler and it is helping some. She reports that the stuffy head and congestion has cleared up, but chest is still alittle congested and has thick clear phlegm. She has been taking claritin, but stopped it and started zyrtec. [facility] told her COVID test was negative. Denies chest pain, shortness of breath, fevers or chills.  The patient does have a symptoms concerning for COVID-19 infection (fever, chills, cough, or new shortness of breath).   Past Medical, Surgical, Social History,  Allergies, and Medications have been Reviewed.  Past Medical History:  Diagnosis Date  . Allergic rhinitis   . Hyperlipidemia   . Hypertension   . Obesity    Past Surgical History:  Procedure Laterality Date  . ABDOMINAL HYSTERECTOMY    . APPENDECTOMY  2003  . COLONOSCOPY N/A 08/20/2014   Procedure: COLONOSCOPY;  Surgeon: West Bali, MD;  [facility];  Service: Endoscopy;  Laterality: N/A;  2:15pm  . LEFT OOPHORECTOMY  2003  . VESICOVAGINAL FISTULA CLOSURE W/ TAH  2003     Current Meds  Medication Sig  . albuterol (VENTOLIN HFA) 108 (90 Base) MCG/ACT inhaler Inhale 2 puffs into the lungs every 6 (six) hours as needed for wheezing or shortness of breath.  Marland Kitchen amLODipine (NORVASC) 5 MG tablet Take 1 tablet (5 mg total) by mouth daily.  Marland Kitchen atorvastatin (LIPITOR) 80 MG tablet Take one tablet at bedtime by mouth from Monday through Thursday, take  Half tablet by mouth Friday, Saturday and Sunday  . gabapentin (NEURONTIN) 100 MG capsule Take 1 capsule (100 mg total) by mouth at bedtime.  . hydrocortisone (ANUSOL-HC) 2.5 % rectal cream Place 1 application rectally 2 (two) times daily.  . Multiple Vitamin (MULTIVITAMIN) tablet Take 1 tablet by mouth daily.  . Omega-3 Fatty Acids (FISH OIL) 1000 MG CAPS Take by mouth. Three times daily  . triamterene-hydrochlorothiazide (MAXZIDE-25) 37.5-25 MG tablet Take one and a half tablets by mouth every day for blood pressure  . [DISCONTINUED] albuterol (VENTOLIN HFA) 108 (90 Base) MCG/ACT  inhaler Inhale 2 puffs into the lungs every 6 (six) hours as needed for wheezing or shortness of breath.     Allergies:   Ace inhibitors and Penicillins   ROS:   Please see the history of present illness.    All other systems reviewed and are negative.   Labs/Other Tests and Data Reviewed:    Recent Labs: 07/24/2019: Hemoglobin 13.7; Platelets 381; TSH 1.88 01/10/2020: ALT 35; BUN 22; Creat 0.81; Potassium 4.3; Sodium 137   Recent Lipid  Panel Lab Results  Component Value Date/Time   CHOL 186 01/10/2020 09:08 AM   TRIG 228 (H) 01/10/2020 09:08 AM   HDL 48 (L) 01/10/2020 09:08 AM   CHOLHDL 3.9 01/10/2020 09:08 AM   LDLCALC 103 (H) 01/10/2020 09:08 AM    Wt Readings from Last 3 Encounters:  06/06/20 181 lb (82.1 kg)  02/01/20 181 lb (82.1 kg)  07/24/19 175 lb (79.4 kg)     Objective:    Vital Signs:  BP 132/84   Ht 5\' 6"  (1.676 m)   Wt 181 lb (82.1 kg)   BMI 29.21 kg/m    VITAL SIGNS:  reviewed GEN:  alert and oriented RESPIRATORY:  no shortness of breath in conversation  PSYCH:  normal affect and mood   ASSESSMENT & PLAN:     1. Cough productive of clear sputum  - chlorpheniramine-HYDROcodone (TUSSIONEX) 10-8 MG/5ML SUER; Take 5 mLs by mouth 2 (two) times daily.  Dispense: 115 mL; Refill: 0 - albuterol (VENTOLIN HFA) 108 (90 Base) MCG/ACT inhaler; Inhale 2 puffs into the lungs every 6 (six) hours as needed for wheezing or shortness of breath.  Dispense: 18 g; Refill: 1   Time:   Today, I have spent 10 minutes with the patient with telehealth technology discussing the above problems.     Medication Adjustments/Labs and Tests Ordered: Current medicines are reviewed at length with the patient today.  Concerns regarding medicines are outlined above.   Tests Ordered: No orders of the defined types were placed in this encounter.   Medication Changes: Meds ordered this encounter  Medications  . chlorpheniramine-HYDROcodone (TUSSIONEX) 10-8 MG/5ML SUER    Sig: Take 5 mLs by mouth 2 (two) times daily.    Dispense:  115 mL    Refill:  0    Order Specific Question:   Supervising Provider    Answer:   SIMPSON, MARGARET E [[id number]]  . albuterol (VENTOLIN HFA) 108 (90 Base) MCG/ACT inhaler    Sig: Inhale 2 puffs into the lungs every 6 (six) hours as needed for wheezing or shortness of breath.    Dispense:  18 g    Refill:  1    Order Specific Question:   Supervising Provider    Answer:   [[id number]]    Disposition:  Follow up 08/06/2020 as schedule Signed, 10/06/2020, NP  06/06/2020 10:29 AM     08/07/2020 [facility]

## 2020-06-06 NOTE — Patient Instructions (Signed)
I appreciate the opportunity to provide you with care for your health and wellness. Today we discussed: cough  Follow up: as scheduled  No labs or referrals today  Hope you feel better.   Please continue to practice social distancing to keep you, your family, and our community safe.  If you must go out, please wear a mask and practice good handwashing.  It was a pleasure to see you and I look forward to continuing to work together on your health and well-being. Please do not hesitate to call the office if you need care or have questions about your care.  Have a wonderful day and week. With Gratitude, Tereasa Coop, DNP, AGNP-BC

## 2020-06-06 NOTE — Assessment & Plan Note (Signed)
Cough with clear sputum, HX of bronchitis, recent tx with steroids, anbx, tessalon perles, and inhaler. Cough syrup provided, encouraged mucinex and allergy medication. Advised to call back if no improvement.

## 2020-08-06 ENCOUNTER — Ambulatory Visit: Payer: BC Managed Care – PPO | Admitting: Family Medicine

## 2020-11-11 ENCOUNTER — Other Ambulatory Visit: Payer: Self-pay | Admitting: Family Medicine

## 2021-01-09 ENCOUNTER — Other Ambulatory Visit: Payer: Self-pay

## 2021-01-09 DIAGNOSIS — E785 Hyperlipidemia, unspecified: Secondary | ICD-10-CM

## 2021-01-09 DIAGNOSIS — E569 Vitamin deficiency, unspecified: Secondary | ICD-10-CM

## 2021-01-09 DIAGNOSIS — I10 Essential (primary) hypertension: Secondary | ICD-10-CM

## 2021-01-09 DIAGNOSIS — R7301 Impaired fasting glucose: Secondary | ICD-10-CM

## 2021-01-31 DIAGNOSIS — E569 Vitamin deficiency, unspecified: Secondary | ICD-10-CM | POA: Diagnosis not present

## 2021-01-31 DIAGNOSIS — R7301 Impaired fasting glucose: Secondary | ICD-10-CM | POA: Diagnosis not present

## 2021-01-31 DIAGNOSIS — I10 Essential (primary) hypertension: Secondary | ICD-10-CM | POA: Diagnosis not present

## 2021-01-31 DIAGNOSIS — E785 Hyperlipidemia, unspecified: Secondary | ICD-10-CM | POA: Diagnosis not present

## 2021-02-01 LAB — HEMOGLOBIN A1C
Est. average glucose Bld gHb Est-mCnc: 126 mg/dL
Hgb A1c MFr Bld: 6 % — ABNORMAL HIGH (ref 4.8–5.6)

## 2021-02-01 LAB — CMP14+EGFR
ALT: 34 IU/L — ABNORMAL HIGH (ref 0–32)
AST: 51 IU/L — ABNORMAL HIGH (ref 0–40)
Albumin/Globulin Ratio: 1.4 (ref 1.2–2.2)
Albumin: 4.7 g/dL (ref 3.8–4.9)
Alkaline Phosphatase: 55 IU/L (ref 44–121)
BUN/Creatinine Ratio: 20 (ref 9–23)
BUN: 19 mg/dL (ref 6–24)
Bilirubin Total: 0.8 mg/dL (ref 0.0–1.2)
CO2: 22 mmol/L (ref 20–29)
Calcium: 9.9 mg/dL (ref 8.7–10.2)
Chloride: 99 mmol/L (ref 96–106)
Creatinine, Ser: 0.95 mg/dL (ref 0.57–1.00)
Globulin, Total: 3.3 g/dL (ref 1.5–4.5)
Glucose: 103 mg/dL — ABNORMAL HIGH (ref 65–99)
Sodium: 138 mmol/L (ref 134–144)
Total Protein: 8 g/dL (ref 6.0–8.5)
eGFR: 71 mL/min/{1.73_m2} (ref >59)

## 2021-02-01 LAB — CBC
Hematocrit: 35.3 % (ref 34.0–46.6)
Hemoglobin: 12.5 g/dL (ref 11.1–15.9)
MCH: 32.5 pg (ref 26.6–33.0)
MCHC: 35.4 g/dL (ref 31.5–35.7)
MCV: 92 fL (ref 79–97)
Platelets: 344 10*3/uL (ref 150–450)
RBC: 3.85 x10E6/uL (ref 3.77–5.28)
RDW: 13.6 % (ref 11.7–15.4)
WBC: 4.9 10*3/uL (ref 3.4–10.8)

## 2021-02-01 LAB — LIPID PANEL
Chol/HDL Ratio: 3.3 ratio (ref 0.0–4.4)
Cholesterol, Total: 162 mg/dL (ref 100–199)
HDL: 49 mg/dL (ref >39)
LDL Chol Calc (NIH): 86 mg/dL (ref 0–99)
Triglycerides: 158 mg/dL — ABNORMAL HIGH (ref 0–149)
VLDL Cholesterol Cal: 27 mg/dL (ref 5–40)

## 2021-02-01 LAB — TSH: TSH: 1.07 u[IU]/mL (ref 0.450–4.500)

## 2021-02-01 LAB — VITAMIN D 25 HYDROXY (VIT D DEFICIENCY, FRACTURES): Vit D, 25-Hydroxy: 42.3 ng/mL (ref 30.0–100.0)

## 2021-02-06 NOTE — Progress Notes (Signed)
Labs are stable. I'll se her on the 11th

## 2021-02-07 ENCOUNTER — Ambulatory Visit (INDEPENDENT_AMBULATORY_CARE_PROVIDER_SITE_OTHER): Payer: BC Managed Care – PPO | Admitting: Nurse Practitioner

## 2021-02-07 ENCOUNTER — Other Ambulatory Visit: Payer: Self-pay

## 2021-02-07 ENCOUNTER — Encounter: Payer: Self-pay | Admitting: Nurse Practitioner

## 2021-02-07 VITALS — BP 158/94 | HR 82 | Temp 97.9°F | Resp 20 | Ht 66.0 in | Wt 183.0 lb

## 2021-02-07 DIAGNOSIS — G43009 Migraine without aura, not intractable, without status migrainosus: Secondary | ICD-10-CM | POA: Diagnosis not present

## 2021-02-07 DIAGNOSIS — Z1231 Encounter for screening mammogram for malignant neoplasm of breast: Secondary | ICD-10-CM | POA: Diagnosis not present

## 2021-02-07 DIAGNOSIS — G629 Polyneuropathy, unspecified: Secondary | ICD-10-CM | POA: Insufficient documentation

## 2021-02-07 DIAGNOSIS — I10 Essential (primary) hypertension: Secondary | ICD-10-CM

## 2021-02-07 DIAGNOSIS — Z0000 Encounter for general adult medical examination without abnormal findings: Secondary | ICD-10-CM

## 2021-02-07 MED ORDER — PROPRANOLOL HCL ER 80 MG PO CP24: 80.0000 mg | ORAL_CAPSULE | Freq: Every day | ORAL | 1 refills | Status: DC

## 2021-02-07 MED ORDER — ROPINIROLE HCL 1 MG PO TABS: 1.0000 mg | ORAL_TABLET | Freq: Every day | ORAL | 1 refills | Status: DC

## 2021-02-07 NOTE — Assessment & Plan Note (Addendum)
-  BP elevated today -Rx propranolol for HTN and migraines

## 2021-02-07 NOTE — Assessment & Plan Note (Signed)
-  Rx. Propanolol for migraine prevention

## 2021-02-07 NOTE — Assessment & Plan Note (Addendum)
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 Requip; stop gabapentin d/t ineffective -if no improvement, may consider neuro consult

## 2021-02-07 NOTE — Progress Notes (Signed)
Established Patient Office Visit  Subjective:  Patient ID: Meredith Sanchez, female    DOB: Mar 29, 1966  Age: 55 y.o. MRN: [id number]  CC:  Chief Complaint  Patient presents with  . Annual Exam    HPI Meredith Sanchez presents for physical exam.  She is having numbness at the top of both feet and that is worse at night. She has been taking gabapentin, but she states that has not been helping at all.  She has been having headaches almost daily. She has been taking Excedrin migraine on a daily basis.  She has HTN and BPs have been running 158/96 at home.  Past Medical History:  Diagnosis Date  . Allergic rhinitis   . Hyperlipidemia   . Hypertension   . Obesity     Past Surgical History:  Procedure Laterality Date  . ABDOMINAL HYSTERECTOMY    . APPENDECTOMY  2003  . COLONOSCOPY N/A 08/20/2014   Procedure: COLONOSCOPY;  Surgeon: West Bali, MD;  [facility];  Service: Endoscopy;  Laterality: N/A;  2:15pm  . LEFT OOPHORECTOMY  2003  . VESICOVAGINAL FISTULA CLOSURE W/ TAH  2003    Family History  Problem Relation Age of Onset  . Diabetes Mother   . Hypertension Mother   . Hypertension Father   . Brain cancer Brother 45       metastatic lung cancer to brain  . Colon cancer Neg Hx     Social History   Socioeconomic History  . Marital status: Married    Spouse name: Not on file  . Number of children: 3  . Years of education: Not on file  . Highest education level: Not on file  Occupational History  . Occupation: employed     Comment: [employer]  Tobacco Use  . Smoking status: Never Smoker  . Smokeless tobacco: Never Used  Substance and Sexual Activity  . Alcohol use: No  . Drug use: No  . Sexual activity: Not on file  Other Topics Concern  . Not on file  Social History Narrative  . Not on file   Social Determinants of Health   Financial Resource Strain: Not on file  Food Insecurity: Not on file  Transportation Needs: Not on file   Physical Activity: Not on file  Stress: Not on file  Social Connections: Not on file  Intimate Partner Violence: Not on file    Outpatient Medications Prior to Visit  Medication Sig Dispense Refill  . albuterol (VENTOLIN HFA) 108 (90 Base) MCG/ACT inhaler Inhale 2 puffs into the lungs every 6 (six) hours as needed for wheezing or shortness of breath. 18 g 1  . amLODipine (NORVASC) 5 MG tablet TAKE 1 TABLET(5 MG) BY MOUTH DAILY 90 tablet 1  . atorvastatin (LIPITOR) 80 MG tablet Take one tablet at bedtime by mouth from Monday through Thursday, take  Half tablet by mouth Friday, Saturday and Sunday 90 tablet 3  . Multiple Vitamin (MULTIVITAMIN) tablet Take 1 tablet by mouth daily.    . Omega-3 Fatty Acids (FISH OIL) 1000 MG CAPS Take by mouth. Three times daily    . triamterene-hydrochlorothiazide (MAXZIDE-25) 37.5-25 MG tablet Take one and a half tablets by mouth every day for blood pressure 135 tablet 3  . chlorpheniramine-HYDROcodone (TUSSIONEX) 10-8 MG/5ML SUER Take 5 mLs by mouth 2 (two) times daily. 115 mL 0  . gabapentin (NEURONTIN) 100 MG capsule Take 1 capsule (100 mg total) by mouth at bedtime. 90 capsule 3  .  hydrocortisone (ANUSOL-HC) 2.5 % rectal cream Place 1 application rectally 2 (two) times daily. 30 g 0   No facility-administered medications prior to visit.    Allergies  Allergen Reactions  . Ace Inhibitors Shortness Of Breath    Roof of mouth  Tingling, throat closing  . Penicillins     Too strong  Too strong     ROS Review of Systems  Constitutional: Negative.   HENT: Negative.   Eyes: Negative.   Respiratory: Negative.   Cardiovascular: Negative.   Gastrointestinal: Negative.   Endocrine: Negative.   Genitourinary: Negative.   Musculoskeletal: Negative.   Skin:       Has scattered red discoloration to bilateral ankles, and she states this started after taking ASA.  Allergic/Immunologic: Negative.   Neurological: Positive for numbness.  Hematological:  Negative.   Psychiatric/Behavioral: Negative.       Objective:    Physical Exam Constitutional:      Appearance: Normal appearance.  HENT:     Head: Normocephalic and atraumatic.     Right Ear: Tympanic membrane, ear canal and external ear normal.     Left Ear: Tympanic membrane, ear canal and external ear normal.     Nose: Nose normal.     Mouth/Throat:     Mouth: Mucous membranes are moist.     Pharynx: Oropharynx is clear.  Eyes:     Extraocular Movements: Extraocular movements intact.     Conjunctiva/sclera: Conjunctivae normal.     Pupils: Pupils are equal, round, and reactive to light.  Cardiovascular:     Rate and Rhythm: Normal rate and regular rhythm.     Pulses: Normal pulses.     Heart sounds: Normal heart sounds.  Pulmonary:     Effort: Pulmonary effort is normal.     Breath sounds: Normal breath sounds.  Abdominal:     General: Abdomen is flat. Bowel sounds are normal.     Palpations: Abdomen is soft.  Genitourinary:    General: Normal vulva.  Musculoskeletal:        General: Normal range of motion.     Cervical back: Normal range of motion and neck supple.  Skin:    General: Skin is warm and dry.     Capillary Refill: Capillary refill takes less than 2 seconds.     Comments: Scattered red discoloration to bilateral ankles  Neurological:     General: No focal deficit present.     Mental Status: She is alert and oriented to person, place, and time.     Cranial Nerves: No cranial nerve deficit.     Sensory: No sensory deficit.     Motor: No weakness.     Coordination: Coordination normal.     Gait: Gait normal.  Psychiatric:        Mood and Affect: Mood normal.        Behavior: Behavior normal.        Thought Content: Thought content normal.        Judgment: Judgment normal.     BP (!) 158/94   Pulse 82   Temp 97.9 F (36.6 C)   Resp 20   Ht 5\' 6"  (1.676 m)   Wt 183 lb (83 kg)   SpO2 95%   BMI 29.54 kg/m  Wt Readings from Last 3  Encounters:  02/07/21 183 lb (83 kg)  06/06/20 181 lb (82.1 kg)  02/01/20 181 lb (82.1 kg)     Health Maintenance Due  Topic Date Due  .  Hepatitis C Screening  Never done  . MAMMOGRAM  02/11/2020    There are no preventive care reminders to display for this patient.  Lab Results  Component Value Date   TSH 1.070 01/31/2021   Lab Results  Component Value Date   WBC 4.9 01/31/2021   HGB 12.5 01/31/2021   HCT 35.3 01/31/2021   MCV 92 01/31/2021   PLT 344 01/31/2021   Lab Results  Component Value Date   NA 138 01/31/2021   K CANCELED 01/31/2021   CO2 22 01/31/2021   GLUCOSE 103 (H) 01/31/2021   BUN 19 01/31/2021   CREATININE 0.95 01/31/2021   BILITOT 0.8 01/31/2021   ALKPHOS 55 01/31/2021   AST 51 (H) 01/31/2021   ALT 34 (H) 01/31/2021   PROT 8.0 01/31/2021   ALBUMIN 4.7 01/31/2021   CALCIUM 9.9 01/31/2021   Lab Results  Component Value Date   CHOL 162 01/31/2021   Lab Results  Component Value Date   HDL 49 01/31/2021   Lab Results  Component Value Date   LDLCALC 86 01/31/2021   Lab Results  Component Value Date   TRIG 158 (H) 01/31/2021   Lab Results  Component Value Date   CHOLHDL 3.3 01/31/2021   Lab Results  Component Value Date   HGBA1C 6.0 (H) 01/31/2021      Assessment & Plan:   Problem List Items Addressed This Visit      Cardiovascular and Mediastinum   Essential hypertension    -BP elevated today -Rx propranolol for HTN and migraines      Relevant Medications   propranolol ER (INDERAL LA) 80 MG 24 hr capsule   Migraine headache    -Rx. 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 Requip; stop gabapentin d/t ineffective -if no improvement, may consider neuro consult       Other Visit Diagnoses    Preventative health care    -  Primary    Relevant Orders   Cytology - PAP   Encounter for screening mammogram for breast cancer       Relevant Orders   MM Digital Screening      Meds ordered this encounter  Medications  . propranolol ER (INDERAL LA) 80 MG 24 hr capsule    Sig: Take 1 capsule (80 mg total) by mouth daily.    Dispense:  30 capsule    Refill:  1  . rOPINIRole (REQUIP) 1 MG tablet    Sig: Take 1 tablet (1 mg total) by mouth at bedtime.    Dispense:  30 tablet    Refill:  1    Follow-up: Return in about 1 month (around 03/10/2021) for Med check (migraine, neuropathy, and HTN).    Heather Roberts, NP

## 2021-02-12 ENCOUNTER — Other Ambulatory Visit: Payer: Self-pay | Admitting: Family Medicine

## 2021-02-16 ENCOUNTER — Other Ambulatory Visit: Payer: Self-pay | Admitting: Family Medicine

## 2021-03-11 ENCOUNTER — Telehealth: Payer: Self-pay

## 2021-03-11 NOTE — Telephone Encounter (Signed)
error 

## 2021-03-21 ENCOUNTER — Telehealth: Payer: BC Managed Care – PPO | Admitting: Nurse Practitioner

## 2021-03-26 ENCOUNTER — Telehealth: Payer: BC Managed Care – PPO | Admitting: Nurse Practitioner

## 2021-03-26 ENCOUNTER — Other Ambulatory Visit: Payer: Self-pay

## 2021-03-26 ENCOUNTER — Encounter: Payer: Self-pay | Admitting: Nurse Practitioner

## 2021-03-26 DIAGNOSIS — G629 Polyneuropathy, unspecified: Secondary | ICD-10-CM | POA: Diagnosis not present

## 2021-03-26 DIAGNOSIS — G43009 Migraine without aura, not intractable, without status migrainosus: Secondary | ICD-10-CM

## 2021-03-26 DIAGNOSIS — I10 Essential (primary) hypertension: Secondary | ICD-10-CM

## 2021-03-26 MED ORDER — NURTEC 75 MG PO TBDP: 1.0000 | ORAL_TABLET | Freq: Every day | ORAL | 2 refills | Status: DC | PRN

## 2021-03-26 MED ORDER — ROPINIROLE HCL 2 MG PO TABS: 2.0000 mg | ORAL_TABLET | Freq: Every day | ORAL | 1 refills | Status: DC

## 2021-03-26 NOTE — Assessment & Plan Note (Signed)
-  she states requip is helping, but does not get rid of 100% of her pain -INCREASE dose to 2 mg qhs

## 2021-03-26 NOTE — Assessment & Plan Note (Signed)
-  no recent BP checks -she states propranolol caused fatigue -recheck BP in 2 months

## 2021-03-26 NOTE — Patient Instructions (Signed)
Please have fasting labs drawn 2-3 days prior to your appointment so we can discuss the results during your office visit.  

## 2021-03-26 NOTE — Progress Notes (Addendum)
Acute Office Visit  Subjective:    Patient ID: Meredith Sanchez, female    DOB: July 21, 1966, 55 y.o.   MRN: [id number]  Chief Complaint  Patient presents with  . Follow-up    Medication check -discuss new medications from last visit.     HPI Patient is in today for follow-up for neuropathy, migraine, and HTN.  At her last OV, she was started on propranolol for HTN/migraine and requip for neuropathy.  She states that requip is working ok for her.  She states that propranolol makes her feel too sleepy, so she hasn't been using it.  She didn't check her BP while she was using propranolol.  Past Medical History:  Diagnosis Date  . Allergic rhinitis   . Hyperlipidemia   . Hypertension   . Myocardial infarction ([place])   . Obesity     Past Surgical History:  Procedure Laterality Date  . ABDOMINAL HYSTERECTOMY    . APPENDECTOMY  2003  . COLONOSCOPY N/A 08/20/2014   Procedure: COLONOSCOPY;  Surgeon: Danie Binder, MD;  [facility];  Service: Endoscopy;  Laterality: N/A;  2:15pm  . LEFT OOPHORECTOMY  2003  . VESICOVAGINAL FISTULA CLOSURE W/ TAH  2003    Family History  Problem Relation Age of Onset  . Diabetes Mother   . Hypertension Mother   . Hypertension Father   . Brain cancer Brother 70       metastatic lung cancer to brain  . Colon cancer Neg Hx     Social History   Socioeconomic History  . Marital status: Married    Spouse name: Not on file  . Number of children: 3  . Years of education: Not on file  . Highest education level: Not on file  Occupational History  . Occupation: employed     Comment: [employer]  Tobacco Use  . Smoking status: Never Smoker  . Smokeless tobacco: Never Used  Substance and Sexual Activity  . Alcohol use: No  . Drug use: No  . Sexual activity: Not on file  Other Topics Concern  . Not on file  Social History Narrative  . Not on file   Social Determinants of Health   Financial Resource Strain: Not on file  Food  Insecurity: Not on file  Transportation Needs: Not on file  Physical Activity: Not on file  Stress: Not on file  Social Connections: Not on file  Intimate Partner Violence: Not on file    Outpatient Medications Prior to Visit  Medication Sig Dispense Refill  . albuterol (VENTOLIN HFA) 108 (90 Base) MCG/ACT inhaler Inhale 2 puffs into the lungs every 6 (six) hours as needed for wheezing or shortness of breath. 18 g 1  . amLODipine (NORVASC) 5 MG tablet TAKE 1 TABLET(5 MG) BY MOUTH DAILY 90 tablet 1  . atorvastatin (LIPITOR) 80 MG tablet Take one tablet at bedtime by mouth from Monday through Thursday, take  Half tablet by mouth Friday, Saturday and Sunday 90 tablet 3  . Multiple Vitamin (MULTIVITAMIN) tablet Take 1 tablet by mouth daily.    . Omega-3 Fatty Acids (FISH OIL) 1000 MG CAPS Take by mouth. Three times daily    . triamterene-hydrochlorothiazide (MAXZIDE-25) 37.5-25 MG tablet TAKE 1 AND 1/2 TABLETS BY MOUTH EVERY DAY FOR BLOOD PRESSURE 135 tablet 3  . propranolol ER (INDERAL LA) 80 MG 24 hr capsule Take 1 capsule (80 mg total) by mouth daily. 30 capsule 1  . rOPINIRole (REQUIP) 1 MG tablet Take  1 tablet (1 mg total) by mouth at bedtime. 30 tablet 1   No facility-administered medications prior to visit.    Allergies  Allergen Reactions  . Ace Inhibitors Shortness Of Breath    Roof of mouth  Tingling, throat closing  . Penicillins     Too strong  Too strong     Review of Systems  Constitutional: Negative.   Respiratory: Negative.   Cardiovascular: Negative.   Neurological: Positive for headaches.       Neuropathy at bedtime has improved some with requip       Objective:    Physical Exam  There were no vitals taken for this visit. Wt Readings from Last 3 Encounters:  02/07/21 183 lb (83 kg)  06/06/20 181 lb (82.1 kg)  02/01/20 181 lb (82.1 kg)    Health Maintenance Due  Topic Date Due  . Hepatitis C Screening  Never done  . MAMMOGRAM  02/11/2020    There  are no preventive care reminders to display for this patient.   Lab Results  Component Value Date   TSH 1.070 01/31/2021   Lab Results  Component Value Date   WBC 4.9 01/31/2021   HGB 12.5 01/31/2021   HCT 35.3 01/31/2021   MCV 92 01/31/2021   PLT 344 01/31/2021   Lab Results  Component Value Date   NA 138 01/31/2021   K CANCELED 01/31/2021   CO2 22 01/31/2021   GLUCOSE 103 (H) 01/31/2021   BUN 19 01/31/2021   CREATININE 0.95 01/31/2021   BILITOT 0.8 01/31/2021   ALKPHOS 55 01/31/2021   AST 51 (H) 01/31/2021   ALT 34 (H) 01/31/2021   PROT 8.0 01/31/2021   ALBUMIN 4.7 01/31/2021   CALCIUM 9.9 01/31/2021   EGFR 71 01/31/2021   Lab Results  Component Value Date   CHOL 162 01/31/2021   Lab Results  Component Value Date   HDL 49 01/31/2021   Lab Results  Component Value Date   LDLCALC 86 01/31/2021   Lab Results  Component Value Date   TRIG 158 (H) 01/31/2021   Lab Results  Component Value Date   CHOLHDL 3.3 01/31/2021   Lab Results  Component Value Date   HGBA1C 6.0 (H) 01/31/2021       Assessment & Plan:   Problem List Items Addressed This Visit      Cardiovascular and Mediastinum   Essential hypertension    -no recent BP checks -she states propranolol caused fatigue -recheck BP in 2 months      Relevant Orders   CBC with Differential/Platelet   CMP14+EGFR   Lipid Panel With LDL/HDL Ratio   Migraine headache    -failed propranolol d/t side effects -had MI several years ago, so avoid triptans -Rx. nurtec      Relevant Medications   Rimegepant Sulfate (NURTEC) 75 MG TBDP     Nervous and Auditory   Neuropathy    -she states requip is helping, but does not get rid of 100% of her pain -INCREASE dose to 2 mg qhs          Meds ordered this encounter  Medications  . Rimegepant Sulfate (NURTEC) 75 MG TBDP    Sig: Take 1 tablet by mouth daily as needed.    Dispense:  8 tablet    Refill:  2  . rOPINIRole (REQUIP) 2 MG tablet    Sig:  Take 1 tablet (2 mg total) by mouth at bedtime.    Dispense:  90 tablet  Refill:  1   Date:  04/01/2021   Location of Patient: Home Location of Provider: Office Consent was obtain for visit to be over via telehealth. I verified that I am speaking with the correct person using two identifiers.  I connected with  Meredith Sanchez on 03/26/21 via telephone and verified that I am speaking with the correct person using two identifiers.   I discussed the limitations of evaluation and management by telemedicine. The patient expressed understanding and agreed to proceed.  Time spent:  15 minutes   Noreene Larsson, NP

## 2021-03-26 NOTE — Assessment & Plan Note (Signed)
-  failed propranolol d/t side effects -had MI several years ago, so avoid triptans -Rx. nurtec

## 2021-04-07 ENCOUNTER — Other Ambulatory Visit: Payer: Self-pay | Admitting: Nurse Practitioner

## 2021-04-07 MED ORDER — RIZATRIPTAN BENZOATE 10 MG PO TBDP: 10.0000 mg | ORAL_TABLET | ORAL | 0 refills | Status: DC | PRN

## 2021-04-07 NOTE — Progress Notes (Signed)
Sent maxalt because Nurtec was denies by insurance

## 2021-04-15 ENCOUNTER — Telehealth: Payer: Self-pay

## 2021-04-15 NOTE — Telephone Encounter (Signed)
[name] from [employer] [phone] called said the [facility] medicine was denied for Prior Auth.  [name] will fax over also the denial.

## 2021-04-15 NOTE — Telephone Encounter (Signed)
error 

## 2021-04-16 NOTE — Telephone Encounter (Signed)
noted 

## 2021-04-20 ENCOUNTER — Other Ambulatory Visit: Payer: Self-pay | Admitting: Family Medicine

## 2021-04-22 ENCOUNTER — Telehealth: Payer: Self-pay

## 2021-04-22 NOTE — Telephone Encounter (Signed)
Patient returning call. Call back # [phone].

## 2021-04-23 NOTE — Telephone Encounter (Signed)
Left message

## 2021-04-23 NOTE — Telephone Encounter (Signed)
Pt informed nurtec was denied and maxalt was sent in however pt was able to pick up the Nurtec for $25.

## 2021-05-06 ENCOUNTER — Other Ambulatory Visit (HOSPITAL_COMMUNITY): Payer: Self-pay | Admitting: Family Medicine

## 2021-05-06 DIAGNOSIS — Z1231 Encounter for screening mammogram for malignant neoplasm of breast: Secondary | ICD-10-CM

## 2021-05-07 ENCOUNTER — Other Ambulatory Visit (HOSPITAL_COMMUNITY): Payer: Self-pay | Admitting: Family Medicine

## 2021-05-07 DIAGNOSIS — R921 Mammographic calcification found on diagnostic imaging of breast: Secondary | ICD-10-CM

## 2021-05-09 ENCOUNTER — Ambulatory Visit (HOSPITAL_COMMUNITY): Payer: BC Managed Care – PPO

## 2021-05-12 ENCOUNTER — Ambulatory Visit (HOSPITAL_COMMUNITY): Payer: BC Managed Care – PPO

## 2021-05-13 ENCOUNTER — Encounter: Payer: Self-pay | Admitting: Internal Medicine

## 2021-05-13 ENCOUNTER — Other Ambulatory Visit: Payer: Self-pay

## 2021-05-13 ENCOUNTER — Telehealth (INDEPENDENT_AMBULATORY_CARE_PROVIDER_SITE_OTHER): Payer: BC Managed Care – PPO | Admitting: Internal Medicine

## 2021-05-13 DIAGNOSIS — U071 COVID-19: Secondary | ICD-10-CM

## 2021-05-13 MED ORDER — NIRMATRELVIR/RITONAVIR (PAXLOVID)TABLET
3.0000 | ORAL_TABLET | Freq: Two times a day (BID) | ORAL | 0 refills | Status: AC
Start: 2021-05-13 — End: 2021-05-18

## 2021-05-13 MED ORDER — HYDROCOD POLST-CPM POLST ER 10-8 MG/5ML PO SUER: 5.0000 mL | Freq: Two times a day (BID) | ORAL | 0 refills | Status: DC | PRN

## 2021-05-13 NOTE — Progress Notes (Signed)
Virtual Visit via Telephone Note   This visit type was conducted due to national recommendations for restrictions regarding the COVID-19 Pandemic (e.g. social distancing) in an effort to limit this patient's exposure and mitigate transmission in our community.  Due to her co-morbid illnesses, this patient is at least at moderate risk for complications without adequate follow up.  This format is felt to be most appropriate for this patient at this time.  The patient did not have access to video technology/had technical difficulties with video requiring transitioning to audio format only (telephone).  All issues noted in this document were discussed and addressed.  No physical exam could be performed with this format.  Evaluation Performed:  Follow-up visit  Date:  05/13/2021   ID:  Meredith Sanchez, DOB [birth year], MRN [id number]  Patient Location: Home Provider Location: Office/Clinic  Participants: Patient Location of Patient: Home Location of Provider: Telehealth Consent was obtain for visit to be over via telehealth. I verified that I am speaking with the correct person using two identifiers.  PCP:  Kerri Perches, MD   Chief Complaint:  Cough and nasal congestion  History of Present Illness:    Meredith Sanchez is a 55 y.o. female who has a televisit for c/o cough, nasal congestion, sore throat and fatigue for last 4 days. She tested positive for COVID at home. She denies any dyspnea or wheezing. She has had 2 doses of COVID vaccine.  The patient does have symptoms concerning for COVID-19 infection (fever, chills, cough, or new shortness of breath).   Past Medical, Surgical, Social History, Allergies, and Medications have been Reviewed.  Past Medical History:  Diagnosis Date   Allergic rhinitis    Hyperlipidemia    Hypertension    Myocardial infarction [facility])    Obesity    Past Surgical History:  Procedure Laterality Date   ABDOMINAL HYSTERECTOMY     APPENDECTOMY   2003   COLONOSCOPY N/A 08/20/2014   Procedure: COLONOSCOPY;  Surgeon: West Bali, MD;  [facility];  Service: Endoscopy;  Laterality: N/A;  2:15pm   LEFT OOPHORECTOMY  2003   VESICOVAGINAL FISTULA CLOSURE W/ TAH  2003     Current Meds  Medication Sig   albuterol (VENTOLIN HFA) 108 (90 Base) MCG/ACT inhaler Inhale 2 puffs into the lungs every 6 (six) hours as needed for wheezing or shortness of breath.   amLODipine (NORVASC) 5 MG tablet TAKE 1 TABLET(5 MG) BY MOUTH DAILY   atorvastatin (LIPITOR) 80 MG tablet TAKE 1 TABLET(80 MG) BY MOUTH DAILY   chlorpheniramine-HYDROcodone (TUSSIONEX PENNKINETIC ER) 10-8 MG/5ML SUER Take 5 mLs by mouth every 12 (twelve) hours as needed for cough.   Multiple Vitamin (MULTIVITAMIN) tablet Take 1 tablet by mouth daily.   nirmatrelvir/ritonavir EUA (PAXLOVID) TABS Take 3 tablets by mouth 2 (two) times daily for 5 days. (Take nirmatrelvir 150 mg two tablets twice daily for 5 days and ritonavir 100 mg one tablet twice daily for 5 days) Patient GFR is 71.   Omega-3 Fatty Acids (FISH OIL) 1000 MG CAPS Take by mouth. Three times daily   rizatriptan (MAXALT-MLT) 10 MG disintegrating tablet Take 1 tablet (10 mg total) by mouth as needed for migraine. May repeat in 2 hours if needed   rOPINIRole (REQUIP) 2 MG tablet Take 1 tablet (2 mg total) by mouth at bedtime.   triamterene-hydrochlorothiazide (MAXZIDE-25) 37.5-25 MG tablet TAKE 1 AND 1/2 TABLETS BY MOUTH EVERY DAY FOR BLOOD PRESSURE  Allergies:   Ace inhibitors and Penicillins   ROS:   Please see the history of present illness.     All other systems reviewed and are negative.   Labs/Other Tests and Data Reviewed:    Recent Labs: 01/31/2021: ALT 34; BUN 19; Creatinine, Ser 0.95; Hemoglobin 12.5; Platelets 344; Potassium CANCELED; Sodium 138; TSH 1.070   Recent Lipid Panel Lab Results  Component Value Date/Time   CHOL 162 01/31/2021 03:48 PM   TRIG 158 (H) 01/31/2021 03:48 PM   HDL 49  01/31/2021 03:48 PM   CHOLHDL 3.3 01/31/2021 03:48 PM   CHOLHDL 3.9 01/10/2020 09:08 AM   LDLCALC 86 01/31/2021 03:48 PM   LDLCALC 103 (H) 01/10/2020 09:08 AM    Wt Readings from Last 3 Encounters:  02/07/21 183 lb (83 kg)  06/06/20 181 lb (82.1 kg)  02/01/20 181 lb (82.1 kg)     ASSESSMENT & PLAN:    COVID-19 infection Started Paxlovid Tussionex PRN Tylenol PRN Self-quarantine for now Advised to contact if persistent or worse symptoms  Time:   Today, I have spent 13 minutes reviewing the chart, including problem list, medications, and with the patient with telehealth technology discussing the above problems.   Medication Adjustments/Labs and Tests Ordered: Current medicines are reviewed at length with the patient today.  Concerns regarding medicines are outlined above.   Tests Ordered: No orders of the defined types were placed in this encounter.   Medication Changes: Meds ordered this encounter  Medications   nirmatrelvir/ritonavir EUA (PAXLOVID) TABS    Sig: Take 3 tablets by mouth 2 (two) times daily for 5 days. (Take nirmatrelvir 150 mg two tablets twice daily for 5 days and ritonavir 100 mg one tablet twice daily for 5 days) Patient GFR is 71.    Dispense:  30 tablet    Refill:  0    Other medications: Amlodipine, Maxalt, Maxzide, Requip   chlorpheniramine-HYDROcodone (TUSSIONEX PENNKINETIC ER) 10-8 MG/5ML SUER    Sig: Take 5 mLs by mouth every 12 (twelve) hours as needed for cough.    Dispense:  115 mL    Refill:  0     Note: This dictation was prepared with Dragon dictation along with smaller phrase technology. Similar sounding words can be transcribed inadequately or may not be corrected upon review. Any transcriptional errors that result from this process are unintentional.      Disposition:  Follow up  Signed, Anabel Halon, MD  05/13/2021 8:48 AM     [facility]

## 2021-05-26 ENCOUNTER — Ambulatory Visit: Payer: BC Managed Care – PPO | Admitting: Family Medicine

## 2021-06-05 ENCOUNTER — Ambulatory Visit: Payer: BC Managed Care – PPO | Admitting: Family Medicine

## 2021-06-17 ENCOUNTER — Ambulatory Visit (HOSPITAL_COMMUNITY)
Admission: RE | Admit: 2021-06-17 | Discharge: 2021-06-17 | Disposition: A | Discharge status: Home / Self Care | Payer: BC Managed Care – PPO | Source: Ambulatory Visit | Attending: Family Medicine | Admitting: Family Medicine

## 2021-06-17 ENCOUNTER — Other Ambulatory Visit: Payer: Self-pay

## 2021-06-17 DIAGNOSIS — R921 Mammographic calcification found on diagnostic imaging of breast: Secondary | ICD-10-CM

## 2021-06-17 DIAGNOSIS — R922 Inconclusive mammogram: Secondary | ICD-10-CM | POA: Diagnosis not present

## 2021-09-08 ENCOUNTER — Encounter: Payer: Self-pay | Admitting: Internal Medicine

## 2021-09-08 ENCOUNTER — Ambulatory Visit (INDEPENDENT_AMBULATORY_CARE_PROVIDER_SITE_OTHER): Payer: BC Managed Care – PPO | Admitting: Internal Medicine

## 2021-09-08 ENCOUNTER — Other Ambulatory Visit: Payer: Self-pay

## 2021-09-08 DIAGNOSIS — J209 Acute bronchitis, unspecified: Secondary | ICD-10-CM | POA: Diagnosis not present

## 2021-09-08 DIAGNOSIS — U071 COVID-19: Secondary | ICD-10-CM

## 2021-09-08 MED ORDER — AZITHROMYCIN 250 MG PO TABS
ORAL_TABLET | ORAL | 0 refills | Status: AC
Start: 2021-09-08 — End: 2021-09-13

## 2021-09-08 MED ORDER — HYDROCOD POLST-CPM POLST ER 10-8 MG/5ML PO SUER: 5.0000 mL | Freq: Two times a day (BID) | ORAL | 0 refills | Status: DC | PRN

## 2021-09-08 MED ORDER — METHYLPREDNISOLONE 4 MG PO TBPK
ORAL_TABLET | ORAL | 0 refills | Status: DC
Start: 2021-09-08 — End: 2022-04-03

## 2021-09-08 NOTE — Progress Notes (Signed)
Virtual Visit via Telephone Note   This visit type was conducted due to national recommendations for restrictions regarding the COVID-19 Pandemic (e.g. social distancing) in an effort to limit this patient's exposure and mitigate transmission in our community.  Due to her co-morbid illnesses, this patient is at least at moderate risk for complications without adequate follow up.  This format is felt to be most appropriate for this patient at this time.  The patient did not have access to video technology/had technical difficulties with video requiring transitioning to audio format only (telephone).  All issues noted in this document were discussed and addressed.  No physical exam could be performed with this format.  Evaluation Performed:  Follow-up visit  Date:  09/08/2021   ID:  Meredith Sanchez 1966-06-09, MRN [id number]  Patient Location: Home Provider Location: Office/Clinic  Participants: Patient Location of Patient: Home Location of Provider: Telehealth Consent was obtain for visit to be over via telehealth. I verified that I am speaking with the correct person using two identifiers.  PCP:  Kerri Perches, MD   Chief Complaint:  Cough, nasal congestion and dyspnea  History of Present Illness:    Meredith Sanchez is a 55 y.o. female who has a televisit for c/o cough, nasal congestion and dyspnea. She has been feeling fever as well.  She tested negative for COVID at home.  Her grand daughter was sick at home recently.  She has been taking Tussionex, Tylenol and using albuterol inhaler, which have been helping.  She denies any chest pain or palpitations.  She had COVID infection in 04/2021.  The patient does have symptoms concerning for COVID-19 infection (fever, chills, cough, or new shortness of breath).   Past Medical, Surgical, Social History, Allergies, and Medications have been Reviewed.  Past Medical History:  Diagnosis Date   Allergic rhinitis     Hyperlipidemia    Hypertension    Myocardial infarction [facility])    Obesity    Past Surgical History:  Procedure Laterality Date   ABDOMINAL HYSTERECTOMY     APPENDECTOMY  2003   COLONOSCOPY N/A 08/20/2014   Procedure: COLONOSCOPY;  Surgeon: West Bali, MD;  [facility];  Service: Endoscopy;  Laterality: N/A;  2:15pm   LEFT OOPHORECTOMY  2003   VESICOVAGINAL FISTULA CLOSURE W/ TAH  2003     Current Meds  Medication Sig   albuterol (VENTOLIN HFA) 108 (90 Base) MCG/ACT inhaler Inhale 2 puffs into the lungs every 6 (six) hours as needed for wheezing or shortness of breath.   amLODipine (NORVASC) 5 MG tablet TAKE 1 TABLET(5 MG) BY MOUTH DAILY   atorvastatin (LIPITOR) 80 MG tablet TAKE 1 TABLET(80 MG) BY MOUTH DAILY   azithromycin (ZITHROMAX) 250 MG tablet Take 2 tablets on day 1, then 1 tablet daily on days 2 through 5   methylPREDNISolone (MEDROL DOSEPAK) 4 MG TBPK tablet Take as package instructions.   Multiple Vitamin (MULTIVITAMIN) tablet Take 1 tablet by mouth daily.   Omega-3 Fatty Acids (FISH OIL) 1000 MG CAPS Take by mouth. Three times daily   rizatriptan (MAXALT-MLT) 10 MG disintegrating tablet Take 1 tablet (10 mg total) by mouth as needed for migraine. May repeat in 2 hours if needed   rOPINIRole (REQUIP) 2 MG tablet Take 1 tablet (2 mg total) by mouth at bedtime.   triamterene-hydrochlorothiazide (MAXZIDE-25) 37.5-25 MG tablet TAKE 1 AND 1/2 TABLETS BY MOUTH EVERY DAY FOR BLOOD PRESSURE   [DISCONTINUED] chlorpheniramine-HYDROcodone (TUSSIONEX  PENNKINETIC ER) 10-8 MG/5ML SUER Take 5 mLs by mouth every 12 (twelve) hours as needed for cough.     Allergies:   Sanchez inhibitors and Penicillins   ROS:   Please see the history of present illness.     All other systems reviewed and are negative.   Labs/Other Tests and Data Reviewed:    Recent Labs: 01/31/2021: ALT 34; BUN 19; Creatinine, Ser 0.95; Hemoglobin 12.5; Platelets 344; Potassium CANCELED; Sodium 138; TSH  1.070   Recent Lipid Panel Lab Results  Component Value Date/Time   CHOL 162 01/31/2021 03:48 PM   TRIG 158 (H) 01/31/2021 03:48 PM   HDL 49 01/31/2021 03:48 PM   CHOLHDL 3.3 01/31/2021 03:48 PM   CHOLHDL 3.9 01/10/2020 09:08 AM   LDLCALC 86 01/31/2021 03:48 PM   LDLCALC 103 (H) 01/10/2020 09:08 AM    Wt Readings from Last 3 Encounters:  02/07/21 183 lb (83 kg)  06/06/20 181 lb (82.1 kg)  02/01/20 181 lb (82.1 kg)     ASSESSMENT & PLAN:    Acute bronchitis Started azithromycin due to persistent symptoms with symptomatic treatment Medrol Dosepak Refilled Tussionex Tylenol as needed Albuterol as needed Advised to contact if new or worsening symptoms  Time:   Today, I have spent 16 minutes reviewing the chart, including problem list, medications, and with the patient with telehealth technology discussing the above problems.   Medication Adjustments/Labs and Tests Ordered: Current medicines are reviewed at length with the patient today.  Concerns regarding medicines are outlined above.   Tests Ordered: No orders of the defined types were placed in this encounter.   Medication Changes: Meds ordered this encounter  Medications   azithromycin (ZITHROMAX) 250 MG tablet    Sig: Take 2 tablets on day 1, then 1 tablet daily on days 2 through 5    Dispense:  6 tablet    Refill:  0   methylPREDNISolone (MEDROL DOSEPAK) 4 MG TBPK tablet    Sig: Take as package instructions.    Dispense:  1 each    Refill:  0   chlorpheniramine-HYDROcodone (TUSSIONEX PENNKINETIC ER) 10-8 MG/5ML SUER    Sig: Take 5 mLs by mouth every 12 (twelve) hours as needed for cough.    Dispense:  115 mL    Refill:  0     Note: This dictation was prepared with Dragon dictation along with smaller phrase technology. Similar sounding words can be transcribed inadequately or may not be corrected upon review. Any transcriptional errors that result from this process are unintentional.      Disposition:   Follow up  Signed, Anabel Halon, MD  09/08/2021 9:32 AM     [facility]

## 2021-09-11 ENCOUNTER — Other Ambulatory Visit: Payer: Self-pay | Admitting: Family Medicine

## 2022-01-19 ENCOUNTER — Other Ambulatory Visit: Payer: Self-pay

## 2022-01-19 ENCOUNTER — Telehealth: Payer: Self-pay

## 2022-01-19 DIAGNOSIS — R7303 Prediabetes: Secondary | ICD-10-CM

## 2022-01-19 DIAGNOSIS — E785 Hyperlipidemia, unspecified: Secondary | ICD-10-CM

## 2022-01-19 DIAGNOSIS — E559 Vitamin D deficiency, unspecified: Secondary | ICD-10-CM

## 2022-01-19 DIAGNOSIS — I10 Essential (primary) hypertension: Secondary | ICD-10-CM

## 2022-01-19 NOTE — Telephone Encounter (Signed)
Patient called to schedule physical and needs bloodwork order

## 2022-01-19 NOTE — Telephone Encounter (Signed)
error 

## 2022-01-19 NOTE — Telephone Encounter (Signed)
Labs 0rdered

## 2022-01-28 DIAGNOSIS — R7303 Prediabetes: Secondary | ICD-10-CM | POA: Diagnosis not present

## 2022-01-28 DIAGNOSIS — E785 Hyperlipidemia, unspecified: Secondary | ICD-10-CM | POA: Diagnosis not present

## 2022-01-28 DIAGNOSIS — I10 Essential (primary) hypertension: Secondary | ICD-10-CM | POA: Diagnosis not present

## 2022-01-28 DIAGNOSIS — E559 Vitamin D deficiency, unspecified: Secondary | ICD-10-CM | POA: Diagnosis not present

## 2022-01-29 LAB — CBC
Hematocrit: 38.4 % (ref 34.0–46.6)
Hemoglobin: 13.5 g/dL (ref 11.1–15.9)
MCH: 31.3 pg (ref 26.6–33.0)
MCHC: 35.2 g/dL (ref 31.5–35.7)
MCV: 89 fL (ref 79–97)
Platelets: 361 10*3/uL (ref 150–450)
RBC: 4.31 x10E6/uL (ref 3.77–5.28)
RDW: 13 % (ref 11.7–15.4)
WBC: 5.3 10*3/uL (ref 3.4–10.8)

## 2022-01-29 LAB — LIPID PANEL
Chol/HDL Ratio: 3.1 ratio (ref 0.0–4.4)
Cholesterol, Total: 159 mg/dL (ref 100–199)
HDL: 52 mg/dL (ref >39)
LDL Chol Calc (NIH): 82 mg/dL (ref 0–99)
Triglycerides: 145 mg/dL (ref 0–149)
VLDL Cholesterol Cal: 25 mg/dL (ref 5–40)

## 2022-01-29 LAB — CMP14+EGFR
ALT: 31 IU/L (ref 0–32)
AST: 30 IU/L (ref 0–40)
Albumin/Globulin Ratio: 1.8 (ref 1.2–2.2)
Albumin: 4.7 g/dL (ref 3.8–4.9)
Alkaline Phosphatase: 61 IU/L (ref 44–121)
BUN/Creatinine Ratio: 23 (ref 9–23)
BUN: 20 mg/dL (ref 6–24)
Bilirubin Total: 0.8 mg/dL (ref 0.0–1.2)
CO2: 24 mmol/L (ref 20–29)
Calcium: 10 mg/dL (ref 8.7–10.2)
Chloride: 100 mmol/L (ref 96–106)
Creatinine, Ser: 0.88 mg/dL (ref 0.57–1.00)
Globulin, Total: 2.6 g/dL (ref 1.5–4.5)
Glucose: 103 mg/dL — ABNORMAL HIGH (ref 70–99)
Potassium: 3.7 mmol/L (ref 3.5–5.2)
Sodium: 140 mmol/L (ref 134–144)
Total Protein: 7.3 g/dL (ref 6.0–8.5)
eGFR: 77 mL/min/{1.73_m2} (ref >59)

## 2022-01-29 LAB — VITAMIN D 25 HYDROXY (VIT D DEFICIENCY, FRACTURES): Vit D, 25-Hydroxy: 57.1 ng/mL (ref 30.0–100.0)

## 2022-01-29 LAB — HEMOGLOBIN A1C
Est. average glucose Bld gHb Est-mCnc: 137 mg/dL
Hgb A1c MFr Bld: 6.4 % — ABNORMAL HIGH (ref 4.8–5.6)

## 2022-01-29 LAB — TSH: TSH: 1.7 u[IU]/mL (ref 0.450–4.500)

## 2022-02-16 ENCOUNTER — Other Ambulatory Visit: Payer: Self-pay | Admitting: Family Medicine

## 2022-02-20 ENCOUNTER — Encounter: Payer: BC Managed Care – PPO | Admitting: Family Medicine

## 2022-04-03 ENCOUNTER — Ambulatory Visit (INDEPENDENT_AMBULATORY_CARE_PROVIDER_SITE_OTHER): Payer: BC Managed Care – PPO | Admitting: Family Medicine

## 2022-04-03 ENCOUNTER — Other Ambulatory Visit (HOSPITAL_COMMUNITY)
Admission: RE | Admit: 2022-04-03 | Discharge: 2022-04-03 | Disposition: A | Discharge status: Home / Self Care | Payer: BC Managed Care – PPO | Source: Ambulatory Visit | Attending: Family Medicine | Admitting: Family Medicine

## 2022-04-03 ENCOUNTER — Encounter: Payer: Self-pay | Admitting: Family Medicine

## 2022-04-03 VITALS — BP 157/92 | HR 74 | Resp 16 | Ht 66.0 in | Wt 182.0 lb

## 2022-04-03 DIAGNOSIS — R928 Other abnormal and inconclusive findings on diagnostic imaging of breast: Secondary | ICD-10-CM | POA: Diagnosis not present

## 2022-04-03 DIAGNOSIS — Z0000 Encounter for general adult medical examination without abnormal findings: Secondary | ICD-10-CM

## 2022-04-03 DIAGNOSIS — R21 Rash and other nonspecific skin eruption: Secondary | ICD-10-CM

## 2022-04-03 DIAGNOSIS — Z124 Encounter for screening for malignant neoplasm of cervix: Secondary | ICD-10-CM

## 2022-04-03 DIAGNOSIS — G43009 Migraine without aura, not intractable, without status migrainosus: Secondary | ICD-10-CM | POA: Diagnosis not present

## 2022-04-03 DIAGNOSIS — Z0001 Encounter for general adult medical examination with abnormal findings: Secondary | ICD-10-CM | POA: Diagnosis not present

## 2022-04-03 DIAGNOSIS — I10 Essential (primary) hypertension: Secondary | ICD-10-CM | POA: Diagnosis not present

## 2022-04-03 DIAGNOSIS — Z23 Encounter for immunization: Secondary | ICD-10-CM

## 2022-04-03 LAB — POCT URINALYSIS DIP (CLINITEK)
Bilirubin, UA: NEGATIVE
Blood, UA: NEGATIVE
Glucose, UA: NEGATIVE mg/dL
Ketones, POC UA: NEGATIVE mg/dL
Leukocytes, UA: NEGATIVE
Nitrite, UA: NEGATIVE
POC PROTEIN,UA: NEGATIVE
Spec Grav, UA: 1.02 (ref 1.010–1.025)
Urobilinogen, UA: 0.2 E.U./dL
pH, UA: 6 (ref 5.0–8.0)

## 2022-04-03 MED ORDER — TRIAMTERENE-HCTZ 75-50 MG PO TABS: 1.0000 | ORAL_TABLET | Freq: Every day | ORAL | 1 refills | Status: DC

## 2022-04-03 MED ORDER — BETAMETHASONE DIPROPIONATE 0.05 % EX CREA: TOPICAL_CREAM | Freq: Two times a day (BID) | CUTANEOUS | 0 refills | Status: DC

## 2022-04-03 MED ORDER — TOPIRAMATE 50 MG PO TABS: 50.0000 mg | ORAL_TABLET | Freq: Two times a day (BID) | ORAL | 3 refills | Status: DC

## 2022-04-03 MED ORDER — RIZATRIPTAN BENZOATE 10 MG PO TBDP: 10.0000 mg | ORAL_TABLET | ORAL | 0 refills | Status: DC | PRN

## 2022-04-03 MED ORDER — ATORVASTATIN CALCIUM 40 MG PO TABS: 40.0000 mg | ORAL_TABLET | Freq: Every day | ORAL | 3 refills | Status: DC

## 2022-04-03 NOTE — Patient Instructions (Addendum)
F/U in 8 to 10 weeks, shingrix #2 and re eval blood pressure. ? ?Shingrix #1 today ONE daily, ? new script at pharmacy ? ?Medication sent to prevent and to  treat migraines ? ?Higher dose triamterene 75/50 oNE / day since BP is high ? ?Medication sent for rash on leg ? ?Reduce fried and fatty foods and starchy/ white foods. Increase vegetables 9 not potato and corn , fruits , and eat more beans and white meats  for protein and egg white ? ?Will be in touch with radiollogy for imaging study that will be done , due to recent abn left mammogram and be back ion touch ? ?It is important that you exercise regularly at least 30 minutes 5 times a week. If you develop chest pain, have severe difficulty breathing, or feel very tired, stop exercising immediately and seek medical attention  ? ?Thanks for choosing [facility], we consider it a privelige to serve you. ? ? ? ?

## 2022-04-03 NOTE — Assessment & Plan Note (Signed)

## 2022-04-03 NOTE — Progress Notes (Signed)
? ? ?Meredith Sanchez     MRN: [id number]      DOB: 1966-03-01 ? ?HPI: ?Patient is in for annual physical exam.Uncontrolled blood pressure, uncontrolled migraines, has 2 per week, rash on right leg are all addressed ?Urine checked for infection per pt request, wants it checked ?Marland Kitchen ?Recent labs,  are reviewed. ?Immunization is reviewed , and  updated if needed. ? ? ?PE: ?BP (!) 157/92   Pulse 74   Resp 16   Ht 5\' 6"  (1.676 m)   Wt 182 lb (82.6 kg)   SpO2 98%   BMI 29.38 kg/m?  ? ?Pleasant  female, alert and oriented x 3, in no cardio-pulmonary distress. ?Afebrile. ?HEENT ?No facial trauma or asymetry. Sinuses non tender.  ?Extra occullar muscles intact.Marland Kitchen ?External ears normal, . ?Neck: supple, no adenopathy,JVD or thyromegaly.No bruits. ? ?Chest: ?Clear to ascultation bilaterally.No crackles or wheezes. ?Non tender to palpation ? ?Breast: ?No asymetry,no masses or lumps. No tenderness. No nipple discharge or inversion. ?No axillary or supraclavicular adenopathy ? ?Cardiovascular system; ?Heart sounds normal,  S1 and  S2 ,no S3.  No murmur, or thrill. ?Apical beat not displaced ?Peripheral pulses normal. ? ?Abdomen: ?Soft, non tender, no organomegaly or masses. ?No bruits. ?Bowel sounds normal. ?No guarding, tenderness or rebound. ? ? ?GU: ?External genitalia normal female genitalia , normal female distribution of hair. No lesions. ?Urethral meatus normal in size, no  Prolapse, no lesions visibly  Present. ?Bladder non tender. ?Vagina pink and moist , with no visible lesions , discharge present . ?Adequate pelvic support no  cystocele or rectocele noted ? ?Uterusa bsent, no adnexal masses, no  adnexal tenderness. ? ? ?Musculoskeletal exam: ?Full ROM of spine, hips , shoulders and knees. ?No deformity ,swelling or crepitus noted. ?No muscle wasting or atrophy.  ? ?Neurologic: ?Cranial nerves 2 to 12 intact. ?Power, tone ,sensation and reflexes normal throughout. ?No disturbance in gait. No tremor. ? ?Skin: ?Intact,  no ulceration, erythema , erythematous macula rash on  on right leg ? ?Psych; ?Normal mood and affect. Judgement and concentration normal ? ? ?Assessment & Plan:  ?Annual physical exam ?Annual exam as documented. ?Counseling done  re healthy lifestyle involving commitment to 150 minutes exercise per week, heart healthy diet, and attaining healthy weight.The importance of adequate sleep also discussed. ?Regular seat belt use and home safety, is also discussed. ?Changes in health habits are decided on by the patient with goals and time frames  set for achieving them. ?Immunization and cancer screening needs are specifically addressed at this visit. ? ? ?Migraine headache ?Uncontrolled , start daily topamax, and maxalt prescribed also ? ?Abnormal mammogram of left breast ?Had refused biopsy in 05/2021, now ready, will request mammo  update ? ?Essential hypertension ?Uncontrolled increase dose of triamterene ?DASH diet and commitment to daily physical activity for a minimum of 30 minutes discussed and encouraged, as a part of hypertension management. ?The importance of attaining a healthy weight is also discussed. ? ? ?  04/03/2022  ?  3:07 PM 04/03/2022  ?  2:17 PM 02/07/2021  ? 11:00 AM 06/06/2020  ?  9:25 AM 02/01/2020  ?  8:44 AM 07/24/2019  ?  2:21 PM 02/14/2018  ? 11:39 AM  ?BP/Weight  ?Systolic BP [id number] [id number] 132 118 120  ?Diastolic BP 92 85 94 84 84 80 82  ?Wt. (Lbs)  182 183 181 181 175 196  ?BMI  29.38 kg/m2 29.54 kg/m2 29.21 kg/m2 29.21 kg/m2 28.25 kg/m2 31.64 kg/m2  ? ? ? ? ? ?  Rash and nonspecific skin eruption ?Topical steroid twice daily, as needed ? ? ?

## 2022-04-03 NOTE — Assessment & Plan Note (Signed)
Had refused biopsy in 05/2021, now ready, will request mammo  update ?

## 2022-04-03 NOTE — Assessment & Plan Note (Signed)
Uncontrolled , start daily topamax, and maxalt prescribed also ?

## 2022-04-05 ENCOUNTER — Encounter: Payer: Self-pay | Admitting: Family Medicine

## 2022-04-05 DIAGNOSIS — R21 Rash and other nonspecific skin eruption: Secondary | ICD-10-CM | POA: Insufficient documentation

## 2022-04-05 NOTE — Assessment & Plan Note (Signed)
Topical steroid twice daily, as needed ?

## 2022-04-05 NOTE — Assessment & Plan Note (Signed)
Uncontrolled increase dose of triamterene ?DASH diet and commitment to daily physical activity for a minimum of 30 minutes discussed and encouraged, as a part of hypertension management. ?The importance of attaining a healthy weight is also discussed. ? ? ?  04/03/2022  ?  3:07 PM 04/03/2022  ?  2:17 PM 02/07/2021  ? 11:00 AM 06/06/2020  ?  9:25 AM 02/01/2020  ?  8:44 AM 07/24/2019  ?  2:21 PM 02/14/2018  ? 11:39 AM  ?BP/Weight  ?Systolic BP 157 167 158 132 132 118 120  ?Diastolic BP 92 85 94 84 84 80 82  ?Wt. (Lbs)  182 183 181 181 175 196  ?BMI  29.38 kg/m2 29.54 kg/m2 29.21 kg/m2 29.21 kg/m2 28.25 kg/m2 31.64 kg/m2  ? ? ? ? ?

## 2022-04-06 ENCOUNTER — Other Ambulatory Visit: Payer: Self-pay

## 2022-04-06 ENCOUNTER — Telehealth: Payer: Self-pay | Admitting: Family Medicine

## 2022-04-06 MED ORDER — TRIAMTERENE-HCTZ 75-50 MG PO TABS: 1.0000 | ORAL_TABLET | Freq: Every day | ORAL | 1 refills | Status: DC

## 2022-04-06 NOTE — Telephone Encounter (Signed)
Pt called stating the amlopidine was not called in last week. Wants to know if this can please be called in? States mg has been changed, please contact pt if available.  ? ? ?[facility] ? ?

## 2022-04-06 NOTE — Telephone Encounter (Signed)
Medication sent.

## 2022-04-07 ENCOUNTER — Other Ambulatory Visit: Payer: Self-pay

## 2022-04-07 ENCOUNTER — Telehealth: Payer: Self-pay

## 2022-04-07 LAB — CYTOLOGY - PAP
Comment: NEGATIVE
Diagnosis: NEGATIVE
High risk HPV: NEGATIVE

## 2022-04-07 MED ORDER — AMLODIPINE BESYLATE 5 MG PO TABS: ORAL_TABLET | ORAL | 1 refills | Status: DC

## 2022-04-07 NOTE — Telephone Encounter (Signed)
Patient called said pharmacy does not have her medicine, said provider changed to 10 mg.  ? ?Suppose to be 10 mg not 5 mg amLODipine (NORVASC) 5 MG tablet  ?[facility] ?

## 2022-04-07 NOTE — Telephone Encounter (Signed)
Patient aware.

## 2022-06-05 ENCOUNTER — Ambulatory Visit (INDEPENDENT_AMBULATORY_CARE_PROVIDER_SITE_OTHER): Payer: BC Managed Care – PPO | Admitting: Family Medicine

## 2022-06-05 ENCOUNTER — Encounter: Payer: Self-pay | Admitting: Family Medicine

## 2022-06-05 VITALS — BP 144/84 | HR 92 | Ht 66.0 in | Wt 178.1 lb

## 2022-06-05 DIAGNOSIS — H60391 Other infective otitis externa, right ear: Secondary | ICD-10-CM

## 2022-06-05 DIAGNOSIS — R7303 Prediabetes: Secondary | ICD-10-CM

## 2022-06-05 DIAGNOSIS — Z139 Encounter for screening, unspecified: Secondary | ICD-10-CM | POA: Diagnosis not present

## 2022-06-05 DIAGNOSIS — I10 Essential (primary) hypertension: Secondary | ICD-10-CM | POA: Diagnosis not present

## 2022-06-05 DIAGNOSIS — J0110 Acute frontal sinusitis, unspecified: Secondary | ICD-10-CM | POA: Insufficient documentation

## 2022-06-05 DIAGNOSIS — H6091 Unspecified otitis externa, right ear: Secondary | ICD-10-CM | POA: Insufficient documentation

## 2022-06-05 DIAGNOSIS — Z23 Encounter for immunization: Secondary | ICD-10-CM | POA: Diagnosis not present

## 2022-06-05 DIAGNOSIS — E785 Hyperlipidemia, unspecified: Secondary | ICD-10-CM

## 2022-06-05 MED ORDER — AZITHROMYCIN 250 MG PO TABS: ORAL_TABLET | ORAL | 0 refills | Status: AC

## 2022-06-05 MED ORDER — AMLODIPINE BESYLATE 10 MG PO TABS: 10.0000 mg | ORAL_TABLET | Freq: Every day | ORAL | 3 refills | Status: DC

## 2022-06-05 NOTE — Assessment & Plan Note (Signed)
Z pack prescribed 

## 2022-06-05 NOTE — Assessment & Plan Note (Signed)
Hyperlipidemia:Low fat diet discussed and encouraged.   Lipid Panel  Lab Results  Component Value Date   CHOL 159 01/28/2022   HDL 52 01/28/2022   LDLCALC 82 01/28/2022   TRIG 145 01/28/2022   CHOLHDL 3.1 01/28/2022     Controlled, no change in medication

## 2022-06-05 NOTE — Assessment & Plan Note (Signed)
Uncontrolled , inc dose of amlodipine DASH diet and commitment to daily physical activity for a minimum of 30 minutes discussed and encouraged, as a part of hypertension management. The importance of attaining a healthy weight is also discussed.     06/05/2022   11:07 PM 06/05/2022    2:58 PM 06/05/2022    2:48 PM 04/03/2022    3:07 PM 04/03/2022    2:17 PM 02/07/2021   11:00 AM 06/06/2020    9:25 AM  BP/Weight  Systolic BP 144 150 160 157 167 158 132  Diastolic BP 84 88 81 92 85 94 84  Wt. (Lbs)   178.12  182 183 181  BMI   28.75 kg/m2  29.38 kg/m2 29.54 kg/m2 29.21 kg/m2

## 2022-06-05 NOTE — Assessment & Plan Note (Signed)
Patient educated about the importance of limiting  Carbohydrate intake , the need to commit to daily physical activity for a minimum of 30 minutes , and to commit weight loss. The fact that changes in all these areas will reduce or eliminate all together the development of diabetes is stressed.      Latest Ref Rng & Units 01/28/2022    1:52 PM 01/31/2021    3:48 PM 01/10/2020    9:08 AM 07/24/2019    3:34 PM 02/10/2018   12:46 PM  Diabetic Labs  HbA1c 4.8 - 5.6 % 6.4  6.0  6.0  5.6  6.4   Chol 100 - 199 mg/dL 582  518  984  210  312   HDL >39 mg/dL 52  49  48  49  45   Calc LDL 0 - 99 mg/dL 82  86  811  75  93   Triglycerides 0 - 149 mg/dL 886  773  736  681  594   Creatinine 0.57 - 1.00 mg/dL 7.07  6.15  1.83  4.37  1.04       06/05/2022   11:07 PM 06/05/2022    2:58 PM 06/05/2022    2:48 PM 04/03/2022    3:07 PM 04/03/2022    2:17 PM 02/07/2021   11:00 AM 06/06/2020    9:25 AM  BP/Weight  Systolic BP 144 150 160 157 167 158 132  Diastolic BP 84 88 81 92 85 94 84  Wt. (Lbs)   178.12  182 183 181  BMI   28.75 kg/m2  29.38 kg/m2 29.54 kg/m2 29.21 kg/m2       No data to display          Updated lab needed at/ before next visit.

## 2022-06-05 NOTE — Patient Instructions (Addendum)
F/u in 8 weeks re evaluate blood pressure , call if you need me sooner  Shingrix #2 today  New higher dose of amlodipine is 10 mg one daily, start today, blood pressure is stil not at goal of 130/80 or less  Z pack prescribed for frontal sinusitis and leftear infection  pLEASE do NOT use Q tips!  It is important that you exercise regularly at least 30 minutes 5 times a week. If you develop chest pain, have severe difficulty breathing, or feel very tired, stop exercising immediately and seek medical attention   Continue plant based eating and avoid sweets and starchy foods  Ensure 64 oz water daily  Labs today, hep C screen, chem 7 and EGFr and hBA1C  Thanks for choosing [facility], we consider it a privelige to serve you.  

## 2022-06-05 NOTE — Progress Notes (Signed)
Meredith Sanchez     MRN: [id number]      DOB: [birth year]   HPI Meredith Sanchez is here for follow up uncontrolled hypertension, medication management and review of any available recent lab and radiology data.  Preventive health is updated, specifically  Cancer screening and Immunization.   C/o bilateral ear pain, right greater than left wears ear plugs and uses q tips also frontal pressure with green drainage x 1 week ROS Denies recent fever or chills. Denies sore throat Denies chest congestion, productive cough or wheezing. Denies chest pains, palpitations and leg swelling Denies abdominal pain, nausea, vomiting,diarrhea or constipation.   Denies dysuria, frequency, hesitancy or incontinence. Denies joint pain, swelling and limitation in mobility. Denies headaches, seizures, numbness, or tingling. Denies depression, anxiety or insomnia. Denies skin break down or rash.   PE  BP (!) 144/84   Pulse 92   Ht 5\' 6"  (1.676 m)   Wt 178 lb 1.9 oz (80.8 kg)   SpO2 91%   BMI 28.75 kg/m    Patient alert and oriented and in no cardiopulmonary distress.  HEENT: No facial asymmetry, EOMI,     Neck supple .Left outer ear canal mildly erythematous, both TM clear, frontal sinus tenderness  Chest: Clear to auscultation bilaterally.  CVS: S1, S2 no murmurs, no S3.Regular rate.  ABD: Soft non tender.   Ext: No edema  MS: Adequate ROM spine, shoulders, hips and knees.  Skin: Intact, no ulcerations or rash noted.  Psych: Good eye contact, normal affect. Memory intact not anxious or depressed appearing.  CNS: CN 2-12 intact, power,  normal throughout.no focal deficits noted.   Assessment & Plan  Essential hypertension Uncontrolled , inc dose of amlodipine DASH diet and commitment to daily physical activity for a minimum of 30 minutes discussed and encouraged, as a part of hypertension management. The importance of attaining a healthy weight is also discussed.     06/05/2022   11:07 PM  06/05/2022    2:58 PM 06/05/2022    2:48 PM 04/03/2022    3:07 PM 04/03/2022    2:17 PM 02/07/2021   11:00 AM 06/06/2020    9:25 AM  BP/Weight  Systolic BP 144 150 160 157 167 158 132  Diastolic BP 84 88 81 92 85 94 84  Wt. (Lbs)   178.12  182 183 181  BMI   28.75 kg/m2  29.38 kg/m2 29.54 kg/m2 29.21 kg/m2       Prediabetes Patient educated about the importance of limiting  Carbohydrate intake , the need to commit to daily physical activity for a minimum of 30 minutes , and to commit weight loss. The fact that changes in all these areas will reduce or eliminate all together the development of diabetes is stressed.      Latest Ref Rng & Units 01/28/2022    1:52 PM 01/31/2021    3:48 PM 01/10/2020    9:08 AM 07/24/2019    3:34 PM 02/10/2018   12:46 PM  Diabetic Labs  HbA1c 4.8 - 5.6 % 6.4  6.0  6.0  5.6  6.4   Chol 100 - 199 mg/dL 02/12/2018  427  062  376  283   HDL >39 mg/dL 52  49  48  49  45   Calc LDL 0 - 99 mg/dL 82  86  151  75  93   Triglycerides 0 - 149 mg/dL 761  607  371  062  694   Creatinine 0.57 -  1.00 mg/dL 1.02  5.85  2.77  8.24  1.04       06/05/2022   11:07 PM 06/05/2022    2:58 PM 06/05/2022    2:48 PM 04/03/2022    3:07 PM 04/03/2022    2:17 PM 02/07/2021   11:00 AM 06/06/2020    9:25 AM  BP/Weight  Systolic BP 144 150 160 157 167 158 132  Diastolic BP 84 88 81 92 85 94 84  Wt. (Lbs)   178.12  182 183 181  BMI   28.75 kg/m2  29.38 kg/m2 29.54 kg/m2 29.21 kg/m2       No data to display          Updated lab needed at/ before next visit.   Right otitis externa Z pack prescribed  Acute frontal sinusitis Z pack prescribed

## 2022-06-06 LAB — BMP8+EGFR
BUN/Creatinine Ratio: 21 (ref 9–23)
BUN: 22 mg/dL (ref 6–24)
CO2: 25 mmol/L (ref 20–29)
Calcium: 10.5 mg/dL — ABNORMAL HIGH (ref 8.7–10.2)
Chloride: 100 mmol/L (ref 96–106)
Creatinine, Ser: 1.05 mg/dL — ABNORMAL HIGH (ref 0.57–1.00)
Glucose: 151 mg/dL — ABNORMAL HIGH (ref 70–99)
Potassium: 3.6 mmol/L (ref 3.5–5.2)
Sodium: 141 mmol/L (ref 134–144)
eGFR: 62 mL/min/{1.73_m2} (ref >59)

## 2022-06-06 LAB — HEMOGLOBIN A1C
Est. average glucose Bld gHb Est-mCnc: 137 mg/dL
Hgb A1c MFr Bld: 6.4 % — ABNORMAL HIGH (ref 4.8–5.6)

## 2022-06-06 LAB — HEPATITIS C ANTIBODY: Hep C Virus Ab: NONREACTIVE

## 2022-06-23 ENCOUNTER — Other Ambulatory Visit: Payer: Self-pay | Admitting: Family Medicine

## 2022-06-26 ENCOUNTER — Encounter: Payer: Self-pay | Admitting: Internal Medicine

## 2022-06-26 ENCOUNTER — Ambulatory Visit: Payer: BC Managed Care – PPO | Admitting: Internal Medicine

## 2022-06-26 VITALS — BP 122/84 | HR 77 | Resp 18 | Ht 66.0 in | Wt 180.8 lb

## 2022-06-26 DIAGNOSIS — L239 Allergic contact dermatitis, unspecified cause: Secondary | ICD-10-CM

## 2022-06-26 MED ORDER — HYDROXYZINE PAMOATE 25 MG PO CAPS: 25.0000 mg | ORAL_CAPSULE | Freq: Three times a day (TID) | ORAL | 0 refills | Status: DC | PRN

## 2022-06-26 MED ORDER — CLOBETASOL PROPIONATE 0.05 % EX CREA: 1.0000 | TOPICAL_CREAM | Freq: Two times a day (BID) | CUTANEOUS | 0 refills | Status: DC

## 2022-06-26 MED ORDER — METHYLPREDNISOLONE 4 MG PO TBPK: ORAL_TABLET | ORAL | 0 refills | Status: DC

## 2022-06-26 NOTE — Progress Notes (Signed)
Acute Office Visit  Subjective:    Patient ID: Meredith Sanchez, female    DOB: 06-27-1966, 56 y.o.   MRN: [id number]  Chief Complaint  Patient presents with   Rash    Rash on bottom both legs spreading up does itch and hurt started 06-22-22 has been using betamethasone cream but seems like this makes it worse    HPI Patient is in today for complaint of rash over her bilateral legs for the last 1 week.  She has redness and severe itching for the legs.  She has tried applying betamethasone cream, but has been having intense itching with it.  She denies any fever, chills, dyspnea or chest tightness.  Denies any recent injury.  She works as a Location manager at [employer].  She reports exposure to coolant chemical.  She keeps her pant rolled up, which could cause allergic reaction.  Past Medical History:  Diagnosis Date   Allergic rhinitis    Hyperlipidemia    Hypertension    Myocardial infarction [facility])    Obesity     Past Surgical History:  Procedure Laterality Date   ABDOMINAL HYSTERECTOMY     APPENDECTOMY  2003   COLONOSCOPY N/A 08/20/2014   Procedure: COLONOSCOPY;  Surgeon: West Bali, MD;  Location: [facility];  Service: Endoscopy;  Laterality: N/A;  2:15pm   LEFT OOPHORECTOMY  2003   VESICOVAGINAL FISTULA CLOSURE W/ TAH  2003    Family History  Problem Relation Age of Onset   Diabetes Mother    Hypertension Mother    Hypertension Father    Brain cancer Brother 78       metastatic lung cancer to brain   Colon cancer Neg Hx     Social History   Socioeconomic History   Marital status: Married    Spouse name: Not on file   Number of children: 3   Years of education: Not on file   Highest education level: Not on file  Occupational History   Occupation: employed     Comment: [employer]  Tobacco Use   Smoking status: Never   Smokeless tobacco: Never  Substance and Sexual Activity   Alcohol use: No   Drug use: No   Sexual activity: Not on  file  Other Topics Concern   Not on file  Social History Narrative   Not on file   Social Determinants of Health   Financial Resource Strain: Not on file  Food Insecurity: Not on file  Transportation Needs: Not on file  Physical Activity: Not on file  Stress: Not on file  Social Connections: Not on file  Intimate Partner Violence: Not on file    Outpatient Medications Prior to Visit  Medication Sig Dispense Refill   amLODipine (NORVASC) 10 MG tablet Take 1 tablet (10 mg total) by mouth daily. 30 tablet 3   ASPIRIN 81 PO Take 1 tablet by mouth daily.     atorvastatin (LIPITOR) 40 MG tablet Take 1 tablet (40 mg total) by mouth daily. 90 tablet 3   Multiple Vitamin (MULTIVITAMIN) tablet Take 1 tablet by mouth daily.     rizatriptan (MAXALT-MLT) 10 MG disintegrating tablet Take 1 tablet (10 mg total) by mouth as needed for migraine. May repeat in 2 hours if needed 10 tablet 0   topiramate (TOPAMAX) 50 MG tablet Take 1 tablet (50 mg total) by mouth 2 (two) times daily. 60 tablet 3   triamterene-hydrochlorothiazide (MAXZIDE) 75-50 MG tablet Take 1 tablet  by mouth daily. 90 tablet 1   betamethasone dipropionate 0.05 % cream APPLY TOPICALLY TO THE AFFECTED AREA TWICE DAILY 30 g 0   No facility-administered medications prior to visit.    Allergies  Allergen Reactions   Ace Inhibitors Shortness Of Breath    Roof of mouth  Tingling, throat closing   Penicillins     Too strong  Too strong     Review of Systems  Constitutional:  Negative for chills and fever.  HENT:  Negative for congestion, sinus pressure, sinus pain and sore throat.   Eyes:  Negative for pain and discharge.  Respiratory:  Negative for cough and shortness of breath.   Cardiovascular:  Negative for chest pain and palpitations.  Gastrointestinal:  Negative for abdominal pain, diarrhea, nausea and vomiting.  Endocrine: Negative for polydipsia and polyuria.  Genitourinary:  Negative for dysuria and hematuria.   Musculoskeletal:  Negative for neck pain and neck stiffness.  Skin:  Positive for rash.  Neurological:  Negative for dizziness and weakness.  Psychiatric/Behavioral:  Negative for agitation and behavioral problems.        Objective:    Physical Exam Vitals reviewed.  Constitutional:      General: She is not in acute distress.    Appearance: She is not diaphoretic.  HENT:     Head: Normocephalic and atraumatic.     Nose: Nose normal.     Mouth/Throat:     Mouth: Mucous membranes are moist.  Eyes:     General: No scleral icterus.    Extraocular Movements: Extraocular movements intact.  Cardiovascular:     Rate and Rhythm: Normal rate and regular rhythm.     Pulses: Normal pulses.     Heart sounds: No murmur heard. Pulmonary:     Breath sounds: Normal breath sounds. No wheezing or rales.  Musculoskeletal:     Cervical back: Neck supple. No tenderness.     Right lower leg: No edema.     Left lower leg: No edema.  Skin:    General: Skin is warm.     Findings: Rash (Erythematous macules over bilateral legs) present.  Neurological:     General: No focal deficit present.     Mental Status: She is alert and oriented to person, place, and time.  Psychiatric:        Mood and Affect: Mood normal.        Behavior: Behavior normal.     BP 122/84 (BP Location: Right Arm, Patient Position: Sitting, Cuff Size: Normal)   Pulse 77   Resp 18   Ht 5\' 6"  (1.676 m)   Wt 180 lb 12.8 oz (82 kg)   SpO2 99%   BMI 29.18 kg/m  Wt Readings from Last 3 Encounters:  06/26/22 180 lb 12.8 oz (82 kg)  06/05/22 178 lb 1.9 oz (80.8 kg)  04/03/22 182 lb (82.6 kg)        Assessment & Plan:   Problem List Items Addressed This Visit       Musculoskeletal and Integument   Allergic contact dermatitis - Primary    Her recurrent rash over legs likely due to contact dermatitis Needs to avoid coolant chemical exposure to skin, needs to wear socks or protective equipment Medrol  dosepak Vistaril as needed for itching Clobetasol cream for local itching Work note provided      Relevant Medications   methylPREDNISolone (MEDROL DOSEPAK) 4 MG TBPK tablet   hydrOXYzine (VISTARIL) 25 MG capsule   clobetasol cream (  TEMOVATE) 0.05 %     Meds ordered this encounter  Medications   methylPREDNISolone (MEDROL DOSEPAK) 4 MG TBPK tablet    Sig: Take as package instructions.    Dispense:  1 each    Refill:  0   hydrOXYzine (VISTARIL) 25 MG capsule    Sig: Take 1 capsule (25 mg total) by mouth every 8 (eight) hours as needed.    Dispense:  30 capsule    Refill:  0   clobetasol cream (TEMOVATE) 0.05 %    Sig: Apply 1 Application topically 2 (two) times daily.    Dispense:  30 g    Refill:  0     Francisco Eyerly Concha Se, MD

## 2022-06-26 NOTE — Patient Instructions (Signed)
Please use Clobetasol cream over the affected area.  Please take Prednisone as prescribed.  Please avoid any known allergen exposure. Try to identify any new chemical or fabric exposure.

## 2022-06-26 NOTE — Assessment & Plan Note (Addendum)
Her recurrent rash over legs likely due to contact dermatitis Needs to avoid coolant chemical exposure to skin, needs to wear socks or protective equipment Medrol dosepak Vistaril as needed for itching Clobetasol cream for local itching Work note provided

## 2022-08-04 ENCOUNTER — Ambulatory Visit: Payer: BC Managed Care – PPO | Admitting: Family Medicine

## 2022-08-14 ENCOUNTER — Encounter: Payer: Self-pay | Admitting: Nurse Practitioner

## 2022-08-14 ENCOUNTER — Ambulatory Visit: Payer: BC Managed Care – PPO | Admitting: Nurse Practitioner

## 2022-08-14 ENCOUNTER — Telehealth (INDEPENDENT_AMBULATORY_CARE_PROVIDER_SITE_OTHER): Payer: BC Managed Care – PPO | Admitting: Nurse Practitioner

## 2022-08-14 DIAGNOSIS — L239 Allergic contact dermatitis, unspecified cause: Secondary | ICD-10-CM

## 2022-08-14 MED ORDER — CLOBETASOL PROPIONATE 0.05 % EX CREA: 1.0000 | TOPICAL_CREAM | Freq: Two times a day (BID) | CUTANEOUS | 0 refills | Status: DC

## 2022-08-14 MED ORDER — METHYLPREDNISOLONE 4 MG PO TBPK: ORAL_TABLET | ORAL | 0 refills | Status: DC

## 2022-08-14 NOTE — Assessment & Plan Note (Signed)
Patient encouraged to apply clobetasol 0.05% cream 1 application 2 times daily, take hydroxyzine 25 mg every 8 hours as needed. Medrol dose 4mg   pack ordered Patient told  to avoid taking  hydroxyzine at the same time with Benadryl I have referred patient to dermatology due to her recurrent rashes.

## 2022-08-14 NOTE — Patient Instructions (Signed)
Please apply clobetasol cream twice daily to the affected sites.  Take hydroxyzine 25 mg 3 times daily as needed for itching  Medrol Dosepak 4 mg tablets , take as instructed on the packaging  Please avoid scratching the rashes to prevent infection.     Thanks for choosing [facility], we consider it a privelige to serve you.

## 2022-08-14 NOTE — Progress Notes (Signed)
Virtual Visit via Telephone Note  I connected with Meredith Sanchez on 08/14/22 at  8:40 AM EDT by telephone and verified that I am speaking with the correct person using two identifiers.  I spent 8 minutes on this telephone encounter  Location: Patient: home Provider: office   I discussed the limitations, risks, security and privacy concerns of performing an evaluation and management service by telephone and the availability of in person appointments. I also discussed with the patient that there may be a patient responsible charge related to this service. The patient expressed understanding and agreed to proceed.   History of Present Illness: Meredith Sanchez presents with complaints of  red rashes on both legs since the past 2 days, rashes are itchy , benadryl  and clobetasol cream  helps the itching some. Denies fever chills, drainage , SOB, malaise. Has hydroxyzine but she has not been taking it, stated that had this type of rashes 2 months ago which resolved after taking prednisone. She denies using any new skin care products or detergents.     Observations/Objective:   Assessment and Plan: Allergic contact dermatitis Patient encouraged to apply clobetasol 0.05% cream 1 application 2 times daily, take hydroxyzine 25 mg every 8 hours as needed. Medrol dose 4mg   pack ordered Patient told  to avoid taking  hydroxyzine at the same time with Benadryl I have referred patient to dermatology due to her recurrent rashes.    Follow Up Instructions:    I discussed the assessment and treatment plan with the patient. The patient was provided an opportunity to ask questions and all were answered. The patient agreed with the plan and demonstrated an understanding of the instructions.   The patient was advised to call back or seek an in-person evaluation if the symptoms worsen or if the condition fails to improve as anticipated.

## 2022-08-17 NOTE — Progress Notes (Signed)
duplicate

## 2022-09-14 ENCOUNTER — Encounter: Payer: Self-pay | Admitting: Internal Medicine

## 2022-09-14 ENCOUNTER — Ambulatory Visit: Payer: BC Managed Care – PPO | Admitting: Internal Medicine

## 2022-09-14 VITALS — BP 132/82 | HR 81 | Wt 179.0 lb

## 2022-09-14 DIAGNOSIS — L259 Unspecified contact dermatitis, unspecified cause: Secondary | ICD-10-CM | POA: Diagnosis not present

## 2022-09-14 DIAGNOSIS — L237 Allergic contact dermatitis due to plants, except food: Secondary | ICD-10-CM

## 2022-09-14 MED ORDER — PREDNISONE 20 MG PO TABS: ORAL_TABLET | ORAL | 0 refills | Status: AC

## 2022-09-14 MED ORDER — BETAMETHASONE DIPROPIONATE 0.05 % EX CREA: TOPICAL_CREAM | Freq: Two times a day (BID) | CUTANEOUS | 0 refills | Status: DC

## 2022-09-14 NOTE — Patient Instructions (Signed)
Thank you for trusting me with your care. To recap, today we discussed the following:   Poison ivy dermatitis - betamethasone dipropionate 0.05 % cream; Apply topically 2 (two) times daily.  Dispense: 30 g; Refill: 0 - predniSONE (DELTASONE) 20 MG tablet; Take 2 tablets (40 mg total) by mouth daily with breakfast for 5 days, THEN 1 tablet (20 mg total) daily with breakfast for 5 days, THEN 0.5 tablets (10 mg total) daily with breakfast for 5 days.  Dispense: 17.5 tablet; Refill: 0

## 2022-09-15 ENCOUNTER — Encounter: Payer: Self-pay | Admitting: Internal Medicine

## 2022-09-15 DIAGNOSIS — L237 Allergic contact dermatitis due to plants, except food: Secondary | ICD-10-CM | POA: Insufficient documentation

## 2022-09-15 NOTE — Assessment & Plan Note (Signed)
Patient presents with puritic rash which developed Saturday afternoon. She has recently moved into a new home. The pain is fresh and she wonders if she could be having a reaction. She also went to a nail salon, but nothing was applied to her ankles or distal leg. She does walk through some wooded areas to get to her home. She has not been doing any yard work lately. The rash starts as small bumps , pruritic , and has spread to bilateral leg and arms.    Assessment/Plan: Poison ivy dermatitis.  We will start patient on oral steroids given rash is on both arm and legs. Also prescribed topical steroid for early stage areas and for use until systemic steroids are working.

## 2022-09-15 NOTE — Progress Notes (Signed)
     CC: rash  HPI:[name] is a 56 y.o. female who presents for evaluation of rash. For the details of today's visit, please refer to the assessment and plan.   Past Medical History:  Diagnosis Date   Allergic rhinitis    Hyperlipidemia    Hypertension    Myocardial infarction ([place])    Obesity     Review of Systems:    Review of Systems  Constitutional:  Negative for chills and fever.  Respiratory:  Negative for shortness of breath and wheezing.   Skin:  Positive for itching and rash.     Physical Exam: Vitals:   09/14/22 1026  BP: 132/82  Pulse: 81  SpO2: 96%  Weight: 179 lb (81.2 kg)     Physical Exam Constitutional:      General: She is not in acute distress.    Appearance: She is not ill-appearing.     Comments: Appears uncomfortable  Cardiovascular:     Rate and Rhythm: Normal rate and regular rhythm.  Skin:    Findings: Rash present.     Comments: Erythematous plaques, with vesicles, pruritic.          Assessment & Plan:   Poison ivy dermatitis Patient presents with puritic rash which developed Saturday afternoon. She has recently moved into a new home. The pain is fresh and she wonders if she could be having a reaction. She also went to a nail salon, but nothing was applied to her ankles or distal leg. She does walk through some wooded areas to get to her home. She has not been doing any yard work lately. The rash starts as small bumps , pruritic , and has spread to bilateral leg and arms.    Assessment/Plan: Poison ivy dermatitis.  We will start patient on oral steroids given rash is on both arm and legs. Also prescribed topical steroid for early stage areas and for use until systemic steroids are working.    Lorene Dy, MD

## 2022-09-22 ENCOUNTER — Other Ambulatory Visit: Payer: Self-pay | Admitting: Nurse Practitioner

## 2022-09-22 ENCOUNTER — Telehealth: Payer: Self-pay | Admitting: Family Medicine

## 2022-09-22 ENCOUNTER — Encounter: Payer: Self-pay | Admitting: Family Medicine

## 2022-09-22 ENCOUNTER — Ambulatory Visit: Payer: BC Managed Care – PPO | Admitting: Family Medicine

## 2022-09-22 ENCOUNTER — Other Ambulatory Visit: Payer: Self-pay

## 2022-09-22 VITALS — BP 142/80 | HR 82 | Ht 66.0 in | Wt 180.0 lb

## 2022-09-22 DIAGNOSIS — E663 Overweight: Secondary | ICD-10-CM | POA: Diagnosis not present

## 2022-09-22 DIAGNOSIS — Z1231 Encounter for screening mammogram for malignant neoplasm of breast: Secondary | ICD-10-CM

## 2022-09-22 DIAGNOSIS — E785 Hyperlipidemia, unspecified: Secondary | ICD-10-CM

## 2022-09-22 DIAGNOSIS — I10 Essential (primary) hypertension: Secondary | ICD-10-CM

## 2022-09-22 DIAGNOSIS — R7303 Prediabetes: Secondary | ICD-10-CM | POA: Diagnosis not present

## 2022-09-22 DIAGNOSIS — L237 Allergic contact dermatitis due to plants, except food: Secondary | ICD-10-CM

## 2022-09-22 DIAGNOSIS — R928 Other abnormal and inconclusive findings on diagnostic imaging of breast: Secondary | ICD-10-CM

## 2022-09-22 DIAGNOSIS — L239 Allergic contact dermatitis, unspecified cause: Secondary | ICD-10-CM

## 2022-09-22 MED ORDER — AMLODIPINE-OLMESARTAN 10-20 MG PO TABS: 1.0000 | ORAL_TABLET | Freq: Every day | ORAL | 3 refills | Status: DC

## 2022-09-22 NOTE — Telephone Encounter (Signed)
Pt agreed to mammogram. Per radiology & pt she is needing a dx mammo with breast US.     According to 06/17/21 imaging stereotactic dx mammo bilateral and bilateral US is what pt needs.     Can you please change order?

## 2022-09-22 NOTE — Patient Instructions (Addendum)
Follow-up in 6 to 7 weeks reevaluate blood pressure call if you need me sooner.  New medication to be started for blood pressure.  Stop amlodipine 10 mg daily.  New is Azor which is amlodipine 10 mg with olmesartan 20 mg 1 tablet once daily.  Continue triamterene as before.  Labs to be obtained 3 to 5 days before next visit HbA1c and Chem-7 and EGFR.  Mammogram is past due please schedule at checkout if patient agrees  It is important that you exercise regularly at least 30 minutes 5 times a week. If you develop chest pain, have severe difficulty breathing, or feel very tired, stop exercising immediately and seek medical attention  Thanks for choosing [facility], we consider it a privelige to serve you.

## 2022-09-22 NOTE — Telephone Encounter (Signed)
Order changed.

## 2022-09-27 NOTE — Progress Notes (Signed)
Meredith Sanchez     MRN: [id number]      DOB: 05/24/1966   HPI Meredith Sanchez is here for follow up and re-evaluation of chronic medical conditions, specifically uncontrolled HTN,medication management and review of any available recent lab and radiology data.  Preventive health is updated, specifically  Cancer screening and Immunization.   Recently treated in the office for poison ivy, which has greatly improved The PT denies any adverse reactions to current medications since the last visit.  s   ROS Denies recent fever or chills. Denies sinus pressure, nasal congestion, ear pain or sore throat. Denies chest congestion, productive cough or wheezing. Denies chest pains, palpitations and leg swelling Denies abdominal pain, nausea, vomiting,diarrhea or constipation.   Denies dysuria, frequency, hesitancy or incontinence. Denies joint pain, swelling and limitation in mobility. Denies headaches, seizures, numbness, or tingling. Denies depression, anxiety or insomnia.  PE  BP (!) 142/80   Pulse 82   Ht 5\' 6"  (1.676 m)   Wt 180 lb 0.6 oz (81.7 kg)   SpO2 96%   BMI 29.06 kg/m   Patient alert and oriented and in no cardiopulmonary distress.  HEENT: No facial asymmetry, EOMI,     Neck supple .  Chest: Clear to auscultation bilaterally.  CVS: S1, S2 no murmurs, no S3.Regular rate.  ABD: Soft non tender.   Ext: No edema  MS: Adequate ROM spine, shoulders, hips and knees.  Skin: Intact, mild macular rash on lower extremityd.  Psych: Good eye contact, normal affect. Memory intact not anxious or depressed appearing.  CNS: CN 2-12 intact, power,  normal throughout.no focal deficits noted.   Assessment & Plan  Essential hypertension Uncontrolled, change to azor DASH diet and commitment to daily physical activity for a minimum of 30 minutes discussed and encouraged, as a part of hypertension management. The importance of attaining a healthy weight is also discussed.      09/22/2022    2:50 PM 09/22/2022    2:36 PM 09/22/2022    2:35 PM 09/14/2022   10:26 AM 06/26/2022    9:01 AM 06/05/2022   11:07 PM 06/05/2022    2:58 PM  BP/Weight  Systolic BP 142 138 147 132 122 144 150  Diastolic BP 80 70 80 82 84 84 88  Wt. (Lbs)   180.04 179 180.8    BMI   29.06 kg/m2 28.89 kg/m2 29.18 kg/m2         Overweight (BMI 25.0-29.9)  Patient re-educated about  the importance of commitment to a  minimum of 150 minutes of exercise per week as able.  The importance of healthy food choices with portion control discussed, as well as eating regularly and within a 12 hour window most days. The need to choose "clean , green" food 50 to 75% of the time is discussed, as well as to make water the primary drink and set a goal of 64 ounces water daily.       09/22/2022    2:35 PM 09/14/2022   10:26 AM 06/26/2022    9:01 AM  Weight /BMI  Weight 180 lb 0.6 oz 179 lb 180 lb 12.8 oz  Height 5\' 6"  (1.676 m)  5\' 6"  (1.676 m)  BMI 29.06 kg/m2 28.89 kg/m2 29.18 kg/m2      Poison ivy dermatitis improved  Hyperlipidemia LDL goal <100 Hyperlipidemia:Low fat diet discussed and encouraged.   Lipid Panel  Lab Results  Component Value Date   CHOL 159 01/28/2022   HDL  52 01/28/2022   LDLCALC 82 01/28/2022   TRIG 145 01/28/2022   CHOLHDL 3.1 01/28/2022

## 2022-09-27 NOTE — Assessment & Plan Note (Signed)
improved

## 2022-09-27 NOTE — Assessment & Plan Note (Signed)
Hyperlipidemia:Low fat diet discussed and encouraged.   Lipid Panel  Lab Results  Component Value Date   CHOL 159 01/28/2022   HDL 52 01/28/2022   LDLCALC 82 01/28/2022   TRIG 145 01/28/2022   CHOLHDL 3.1 01/28/2022

## 2022-09-27 NOTE — Assessment & Plan Note (Signed)
Uncontrolled, change to azor DASH diet and commitment to daily physical activity for a minimum of 30 minutes discussed and encouraged, as a part of hypertension management. The importance of attaining a healthy weight is also discussed.     09/22/2022    2:50 PM 09/22/2022    2:36 PM 09/22/2022    2:35 PM 09/14/2022   10:26 AM 06/26/2022    9:01 AM 06/05/2022   11:07 PM 06/05/2022    2:58 PM  BP/Weight  Systolic BP 863 817 711 657 903 833 383  Diastolic BP 80 70 80 82 84 84 88  Wt. (Lbs)   180.04 179 180.8    BMI   29.06 kg/m2 28.89 kg/m2 29.18 kg/m2

## 2022-09-27 NOTE — Assessment & Plan Note (Signed)
  Patient re-educated about  the importance of commitment to a  minimum of 150 minutes of exercise per week as able.  The importance of healthy food choices with portion control discussed, as well as eating regularly and within a 12 hour window most days. The need to choose "clean , green" food 50 to 75% of the time is discussed, as well as to make water the primary drink and set a goal of 64 ounces water daily.       09/22/2022    2:35 PM 09/14/2022   10:26 AM 06/26/2022    9:01 AM  Weight /BMI  Weight 180 lb 0.6 oz 179 lb 180 lb 12.8 oz  Height 5\' 6"  (1.676 m)  5\' 6"  (1.676 m)  BMI 29.06 kg/m2 28.89 kg/m2 29.18 kg/m2

## 2022-09-29 IMAGING — MG DIGITAL DIAGNOSTIC BILAT W/ TOMO W/ CAD
6 of 10 series · 6 of 26 positions shown · non-contrast
Comparison: Previous exams.

CLINICAL DATA: 55-year-old female presents for follow-up of linear
oriented calcifications in the lower inner left breast measuring
cm. Stereotactic biopsy was recommended, however the patient
declined biopsy at the time.

EXAM:
DIGITAL DIAGNOSTIC BILATERAL MAMMOGRAM WITH TOMOSYNTHESIS AND CAD
TECHNIQUE: Bilateral digital diagnostic mammography and breast tomosynthesis
was performed. The images were evaluated with computer-aided
detection.

[L CC]
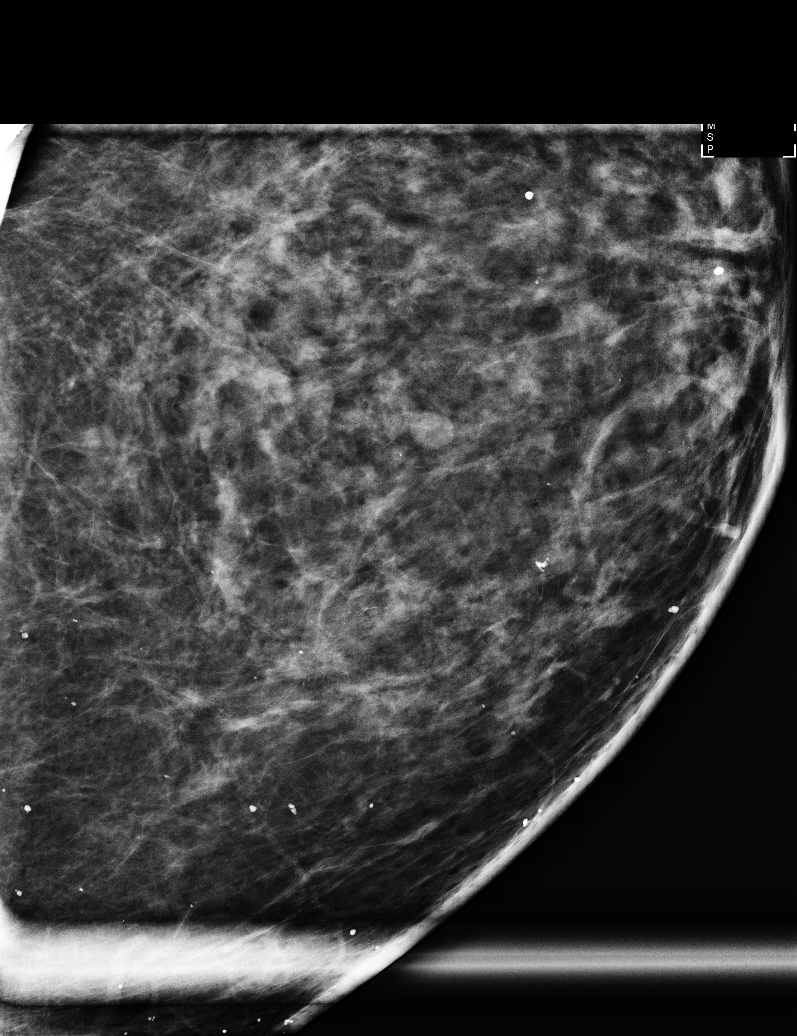

[L ML]
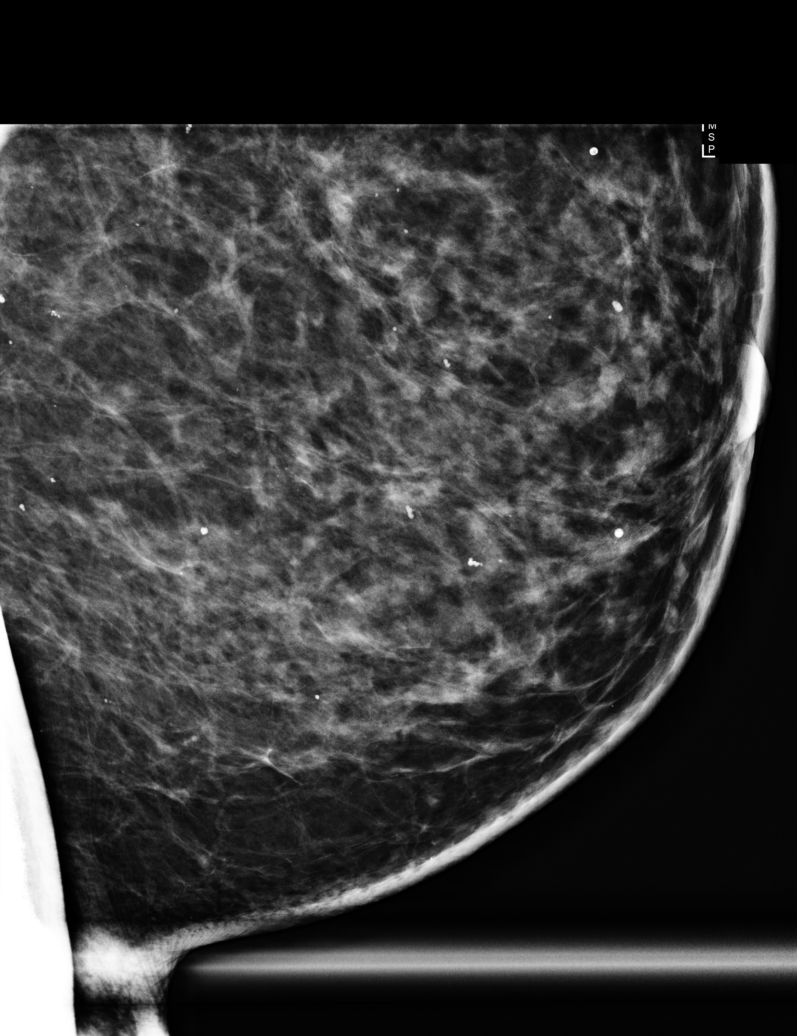

[R MLO synth-2D]
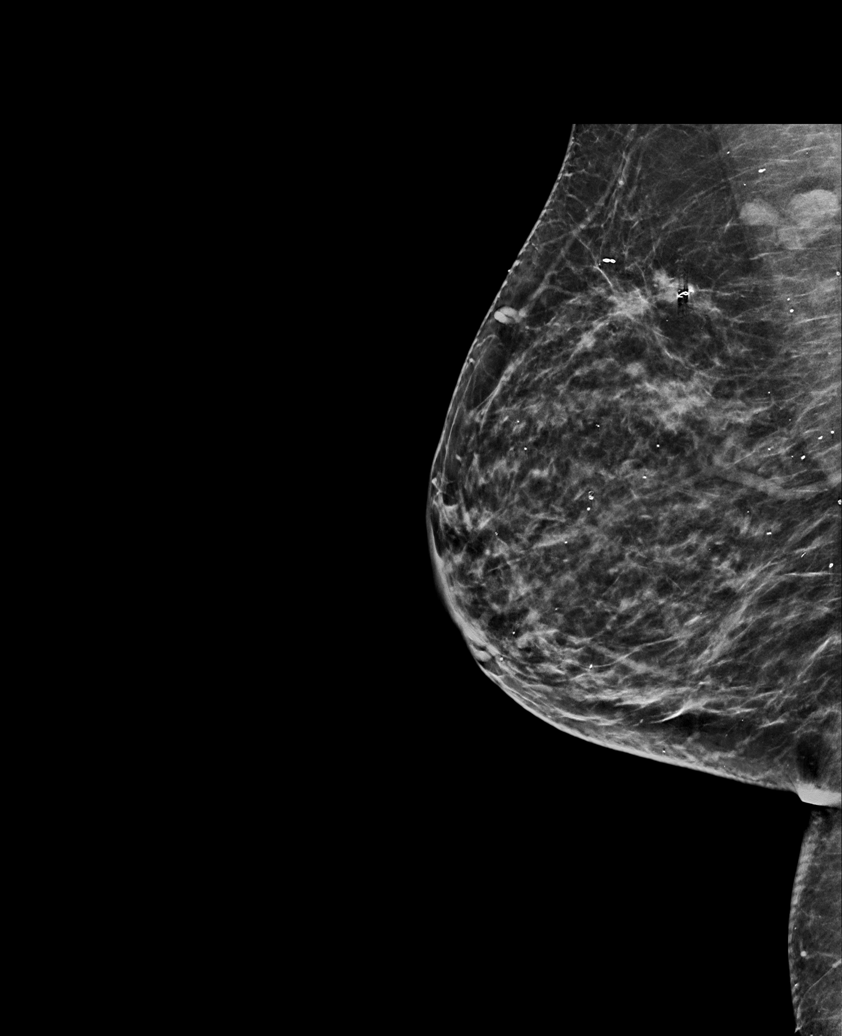

[L CC synth-2D]
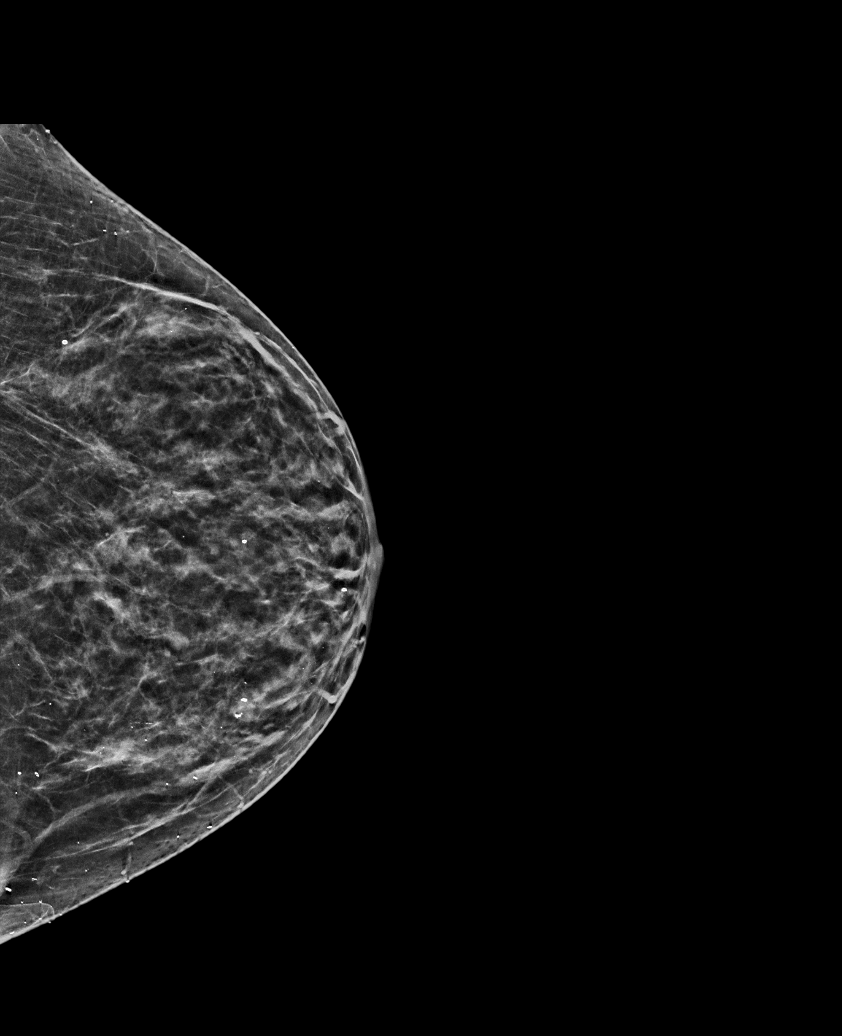

[R CC synth-2D]
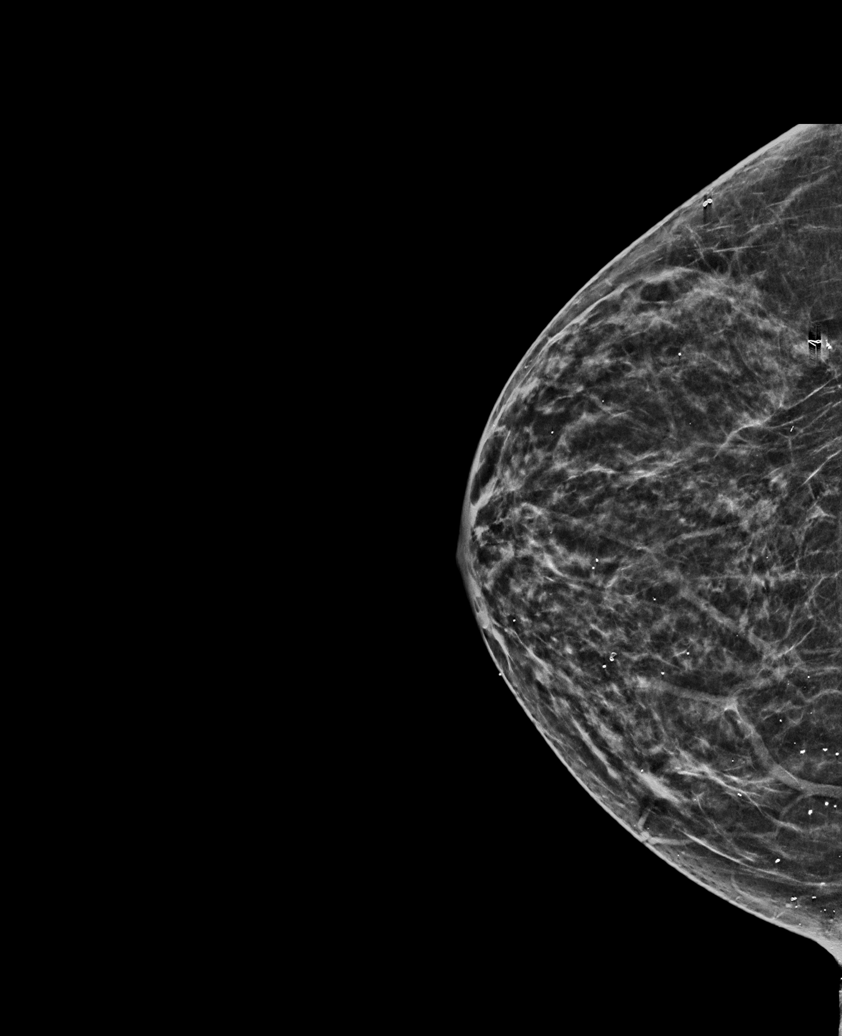

[L MLO synth-2D]
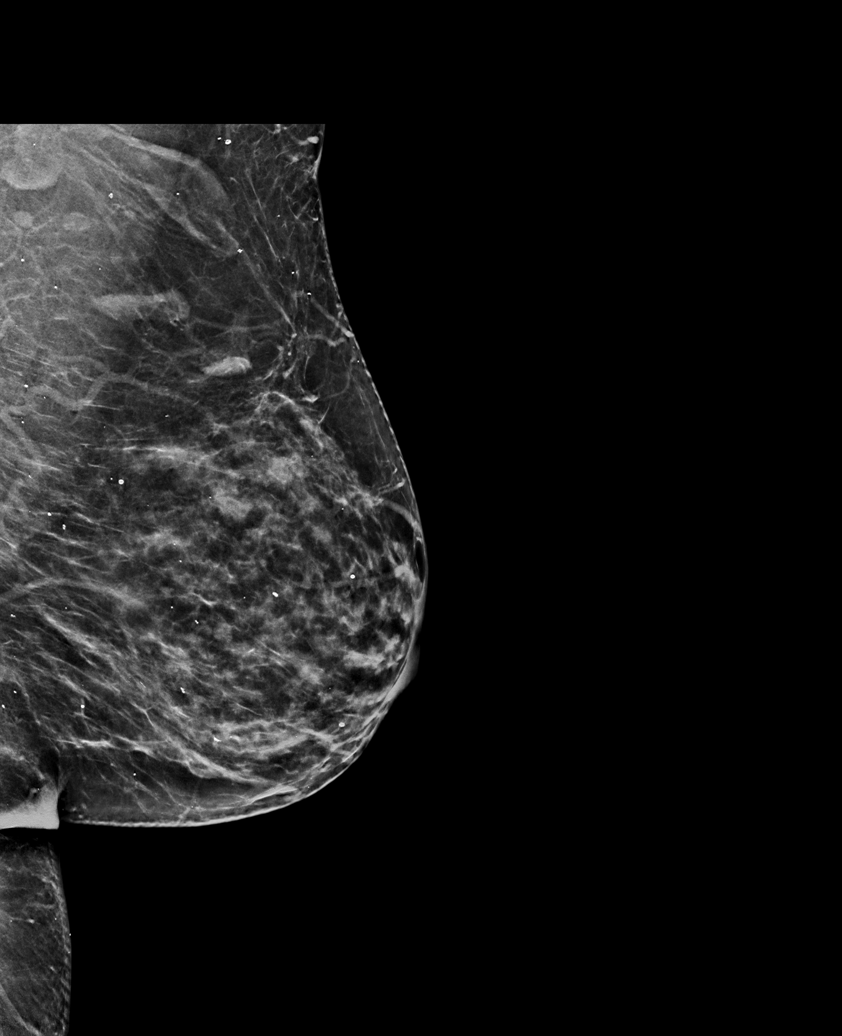

[6 of 26 positions shown; findings below may reference images not displayed]

ACR Breast Density Category c: The breast tissue is heterogeneously
dense, which may obscure small masses.
FINDINGS: No suspicious masses or calcifications are seen in the right breast.
Spot compression magnification views were performed over the lower
inner left breast demonstrating a 0.9 cm group of linear oriented
somewhat branching calcifications. Although these have not
appreciably increased (previously measured 0.8 cm) the morphology
remains suspicious.
IMPRESSION: 1. Indeterminate 0.9 cm group of calcifications in the lower inner
left breast. These are overall not significantly changed when
compared to prior mammograms from 2727, however the given somewhat
linear oriented branching appearance of these calcifications the
findings remains suspicious.

2.  No findings of malignancy in the right breast.

RECOMMENDATION:
There recommendation of stereotactic guided biopsy of the
calcifications in the lower inner left breast remains. At this time
the patient is not interested in pursuing biopsy.

I have discussed the findings and recommendations with the patient.
If applicable, a reminder letter will be sent to the patient
regarding the next appointment.

BI-RADS CATEGORY  4: Suspicious.

## 2022-10-20 ENCOUNTER — Ambulatory Visit (HOSPITAL_COMMUNITY)
Admission: RE | Admit: 2022-10-20 | Discharge: 2022-10-20 | Disposition: A | Discharge status: Home / Self Care | Payer: BC Managed Care – PPO | Source: Ambulatory Visit | Attending: Family Medicine | Admitting: Family Medicine

## 2022-10-20 DIAGNOSIS — R928 Other abnormal and inconclusive findings on diagnostic imaging of breast: Secondary | ICD-10-CM

## 2022-10-20 DIAGNOSIS — R92333 Mammographic heterogeneous density, bilateral breasts: Secondary | ICD-10-CM | POA: Diagnosis not present

## 2022-10-21 ENCOUNTER — Telehealth: Payer: Self-pay | Admitting: Family Medicine

## 2022-10-21 NOTE — Telephone Encounter (Signed)
Attempted to speak directly with pt regarding abn mammogram , left her a message to call me back asap

## 2022-11-03 ENCOUNTER — Other Ambulatory Visit: Payer: Self-pay | Admitting: Family Medicine

## 2022-11-03 ENCOUNTER — Encounter: Payer: Self-pay | Admitting: Family Medicine

## 2022-11-03 ENCOUNTER — Ambulatory Visit: Payer: BC Managed Care – PPO | Admitting: Family Medicine

## 2022-11-03 VITALS — BP 114/72 | HR 86 | Ht 66.0 in | Wt 184.0 lb

## 2022-11-03 DIAGNOSIS — E785 Hyperlipidemia, unspecified: Secondary | ICD-10-CM | POA: Diagnosis not present

## 2022-11-03 DIAGNOSIS — R928 Other abnormal and inconclusive findings on diagnostic imaging of breast: Secondary | ICD-10-CM | POA: Diagnosis not present

## 2022-11-03 DIAGNOSIS — I10 Essential (primary) hypertension: Secondary | ICD-10-CM

## 2022-11-03 DIAGNOSIS — J309 Allergic rhinitis, unspecified: Secondary | ICD-10-CM

## 2022-11-03 DIAGNOSIS — R7303 Prediabetes: Secondary | ICD-10-CM | POA: Diagnosis not present

## 2022-11-03 MED ORDER — AMLODIPINE-OLMESARTAN 10-20 MG PO TABS: 1.0000 | ORAL_TABLET | Freq: Every day | ORAL | 3 refills | Status: DC

## 2022-11-03 MED ORDER — TRIAMTERENE-HCTZ 37.5-25 MG PO TABS: 1.0000 | ORAL_TABLET | Freq: Every day | ORAL | 3 refills | Status: DC

## 2022-11-03 NOTE — Assessment & Plan Note (Signed)
Patient educated about the importance of limiting  Carbohydrate intake , the need to commit to daily physical activity for a minimum of 30 minutes , and to commit weight loss. The fact that changes in all these areas will reduce or eliminate all together the development of diabetes is stressed.      Latest Ref Rng & Units 06/05/2022    4:03 PM 01/28/2022    1:52 PM 01/31/2021    3:48 PM 01/10/2020    9:08 AM 07/24/2019    3:34 PM  Diabetic Labs  HbA1c 4.8 - 5.6 % 6.4  6.4  6.0  6.0  5.6   Chol 100 - 199 mg/dL  161  096  045  409   HDL >39 mg/dL  52  49  48  49   Calc LDL 0 - 99 mg/dL  82  86  811  75   Triglycerides 0 - 149 mg/dL  914  782  956  213   Creatinine 0.57 - 1.00 mg/dL 0.86  5.78  4.69  6.29  0.94       11/03/2022    3:36 PM 11/03/2022    2:54 PM 09/22/2022    2:50 PM 09/22/2022    2:36 PM 09/22/2022    2:35 PM 09/14/2022   10:26 AM 06/26/2022    9:01 AM  BP/Weight  Systolic BP 114 118 142 138 147 132 122  Diastolic BP 72 76 80 70 80 82 84  Wt. (Lbs)  184   180.04 179 180.8  BMI  29.7 kg/m2   29.06 kg/m2 28.89 kg/m2 29.18 kg/m2       No data to display          Updated lab needed.

## 2022-11-03 NOTE — Progress Notes (Signed)
Meredith Sanchez     MRN: [id number]      DOB: 05/12/1966   HPI Meredith Sanchez is here for follow up and re-evaluation of chronic medical conditions, medication management and review of any available recent lab and radiology data.  Preventive health is updated, specifically  Cancer screening and Immunization.   Markedly increased calcification on recent mammogram spent approx 5 mins discussing need for biopsy, she will call to schedule her appt once she decides but is leading towards getting bx done Had to reduce blood pressure med in half as was ligh headed and BP low  ROS Denies recent fever or chills. Denies sinus pressure, nasal congestion, ear pain or sore throat. Denies chest congestion, productive cough or wheezing. Denies chest pains, palpitations and leg swelling Denies abdominal pain, nausea, vomiting,diarrhea or constipation.   Denies dysuria, frequency, hesitancy or incontinence. Denies joint pain, swelling and limitation in mobility. Denies headaches, seizures, numbness, or tingling. Denies depression, anxiety or insomnia. Denies skin break down or rash.   PE  BP 114/72   Pulse 86   Ht 5\' 6"  (1.676 m)   Wt 184 lb (83.5 kg)   SpO2 94%   BMI 29.70 kg/m   Patient alert and oriented and in no cardiopulmonary distress.  HEENT: No facial asymmetry, EOMI,     Neck supple .  Chest: Clear to auscultation bilaterally.  CVS: S1, S2 no murmurs, no S3.Regular rate.  ABD: Soft non tender.   Ext: No edema  MS: Adequate ROM spine, shoulders, hips and knees.  Skin: Intact, no ulcerations or rash noted.  Psych: Good eye contact, normal affect. Memory intact not anxious or depressed appearing.  CNS: CN 2-12 intact, power,  normal throughout.no focal deficits noted.   Assessment & Plan  Essential hypertension Controlled, no change in medication DASH diet and commitment to daily physical activity for a minimum of 30 minutes discussed and encouraged, as a part of  hypertension management. The importance of attaining a healthy weight is also discussed.     11/03/2022    3:36 PM 11/03/2022    2:54 PM 09/22/2022    2:50 PM 09/22/2022    2:36 PM 09/22/2022    2:35 PM 09/14/2022   10:26 AM 06/26/2022    9:01 AM  BP/Weight  Systolic BP 114 118 142 138 147 132 122  Diastolic BP 72 76 80 70 80 82 84  Wt. (Lbs)  184   180.04 179 180.8  BMI  29.7 kg/m2   29.06 kg/m2 28.89 kg/m2 29.18 kg/m2       Allergic rhinitis Controlled ,  uses bedtime benadryl  Abnormal mammogram of left breast Discusse dwithpt need to have bx, she will contact Dept to schedule  Hyperlipidemia LDL goal <100 Hyperlipidemia:Low fat diet discussed and encouraged.   Lipid Panel  Lab Results  Component Value Date   CHOL 159 01/28/2022   HDL 52 01/28/2022   LDLCALC 82 01/28/2022   TRIG 145 01/28/2022   CHOLHDL 3.1 01/28/2022     Updated lab needed at/ before next visit.   Prediabetes Patient educated about the importance of limiting  Carbohydrate intake , the need to commit to daily physical activity for a minimum of 30 minutes , and to commit weight loss. The fact that changes in all these areas will reduce or eliminate all together the development of diabetes is stressed.      Latest Ref Rng & Units 06/05/2022    4:03 PM 01/28/2022  1:52 PM 01/31/2021    3:48 PM 01/10/2020    9:08 AM 07/24/2019    3:34 PM  Diabetic Labs  HbA1c 4.8 - 5.6 % 6.4  6.4  6.0  6.0  5.6   Chol 100 - 199 mg/dL  673  419  379  024   HDL >39 mg/dL  52  49  48  49   Calc LDL 0 - 99 mg/dL  82  86  097  75   Triglycerides 0 - 149 mg/dL  353  299  242  683   Creatinine 0.57 - 1.00 mg/dL 4.19  6.22  2.97  9.89  0.94       11/03/2022    3:36 PM 11/03/2022    2:54 PM 09/22/2022    2:50 PM 09/22/2022    2:36 PM 09/22/2022    2:35 PM 09/14/2022   10:26 AM 06/26/2022    9:01 AM  BP/Weight  Systolic BP 114 118 142 138 147 132 122  Diastolic BP 72 76 80 70 80 82 84  Wt. (Lbs)  184   180.04 179  180.8  BMI  29.7 kg/m2   29.06 kg/m2 28.89 kg/m2 29.18 kg/m2       No data to display          Updated lab needed.

## 2022-11-03 NOTE — Patient Instructions (Signed)
Annual exam in May, call if you need me sooner  Excellent BP  I STRONGLY recommend that you get the biosy as we discussed. You will need to directly contact the radiology department to schedule this  Labs already ordered today  Fasting cBC, lipid, cmp and EGFR, TSH, hBA1C and vit D 3 to 5 days before next visit  Lower dose triamterene 37.5 is prescribed , please ensure you get this dose when you pick up, take one daily  It is important that you exercise regularly at least 30 minutes 5 times a week. If you develop chest pain, have severe difficulty breathing, or feel very tired, stop exercising immediately and seek medical attention  Think about what you will eat, plan ahead. Choose " clean, green, fresh or frozen" over canned, processed or packaged foods which are more sugary, salty and fatty. 70 to 75% of food eaten should be vegetables and fruit. Three meals at set times with snacks allowed between meals, but they must be fruit or vegetables. Aim to eat over a 12 hour period , example 7 am to 7 pm, and STOP after  your last meal of the day. Drink water,generally about 64 ounces per day, no other drink is as healthy. Fruit juice is best enjoyed in a healthy way, by EATING the fruit. Thanks for choosing [facility], we consider it a privelige to serve you.

## 2022-11-03 NOTE — Assessment & Plan Note (Signed)
Controlled ,  uses bedtime benadryl

## 2022-11-03 NOTE — Assessment & Plan Note (Signed)
Discusse dwithpt need to have bx, she will contact Dept to schedule

## 2022-11-03 NOTE — Assessment & Plan Note (Signed)
Controlled, no change in medication DASH diet and commitment to daily physical activity for a minimum of 30 minutes discussed and encouraged, as a part of hypertension management. The importance of attaining a healthy weight is also discussed.     11/03/2022    3:36 PM 11/03/2022    2:54 PM 09/22/2022    2:50 PM 09/22/2022    2:36 PM 09/22/2022    2:35 PM 09/14/2022   10:26 AM 06/26/2022    9:01 AM  BP/Weight  Systolic BP 114 118 142 138 147 132 122  Diastolic BP 72 76 80 70 80 82 84  Wt. (Lbs)  184   180.04 179 180.8  BMI  29.7 kg/m2   29.06 kg/m2 28.89 kg/m2 29.18 kg/m2

## 2022-11-03 NOTE — Assessment & Plan Note (Signed)
Hyperlipidemia:Low fat diet discussed and encouraged.   Lipid Panel  Lab Results  Component Value Date   CHOL 159 01/28/2022   HDL 52 01/28/2022   LDLCALC 82 01/28/2022   TRIG 145 01/28/2022   CHOLHDL 3.1 01/28/2022     Updated lab needed at/ before next visit.

## 2022-11-05 LAB — BMP8+EGFR
BUN/Creatinine Ratio: 26 — ABNORMAL HIGH (ref 9–23)
BUN: 24 mg/dL (ref 6–24)
CO2: 24 mmol/L (ref 20–29)
Calcium: 10.4 mg/dL — ABNORMAL HIGH (ref 8.7–10.2)
Chloride: 101 mmol/L (ref 96–106)
Creatinine, Ser: 0.94 mg/dL (ref 0.57–1.00)
Glucose: 127 mg/dL — ABNORMAL HIGH (ref 70–99)
Potassium: 4.1 mmol/L (ref 3.5–5.2)
Sodium: 142 mmol/L (ref 134–144)
eGFR: 71 mL/min/{1.73_m2} (ref >59)

## 2022-11-05 LAB — HEMOGLOBIN A1C
Est. average glucose Bld gHb Est-mCnc: 143 mg/dL
Hgb A1c MFr Bld: 6.6 % — ABNORMAL HIGH (ref 4.8–5.6)

## 2022-11-06 ENCOUNTER — Other Ambulatory Visit: Payer: Self-pay | Admitting: Family Medicine

## 2022-11-06 DIAGNOSIS — E785 Hyperlipidemia, unspecified: Secondary | ICD-10-CM

## 2022-11-06 DIAGNOSIS — R7303 Prediabetes: Secondary | ICD-10-CM

## 2022-11-06 DIAGNOSIS — E559 Vitamin D deficiency, unspecified: Secondary | ICD-10-CM

## 2022-11-06 DIAGNOSIS — I10 Essential (primary) hypertension: Secondary | ICD-10-CM

## 2023-01-23 ENCOUNTER — Other Ambulatory Visit: Payer: Self-pay | Admitting: Family Medicine

## 2023-01-26 ENCOUNTER — Other Ambulatory Visit: Payer: Self-pay | Admitting: Family Medicine

## 2023-01-26 MED ORDER — FLUOCINONIDE EMULSIFIED BASE 0.05 % EX CREA: 1.0000 | TOPICAL_CREAM | Freq: Two times a day (BID) | CUTANEOUS | 0 refills | Status: DC

## 2023-01-27 DIAGNOSIS — L309 Dermatitis, unspecified: Secondary | ICD-10-CM | POA: Diagnosis not present

## 2023-02-09 ENCOUNTER — Other Ambulatory Visit: Payer: Self-pay | Admitting: Family Medicine

## 2023-02-09 DIAGNOSIS — L239 Allergic contact dermatitis, unspecified cause: Secondary | ICD-10-CM

## 2023-02-15 ENCOUNTER — Ambulatory Visit: Payer: BC Managed Care – PPO | Admitting: Internal Medicine

## 2023-02-15 ENCOUNTER — Encounter: Payer: Self-pay | Admitting: Internal Medicine

## 2023-02-15 VITALS — BP 144/84 | HR 92 | Ht 66.0 in | Wt 184.6 lb

## 2023-02-15 DIAGNOSIS — L309 Dermatitis, unspecified: Secondary | ICD-10-CM

## 2023-02-15 DIAGNOSIS — L239 Allergic contact dermatitis, unspecified cause: Secondary | ICD-10-CM | POA: Diagnosis not present

## 2023-02-15 MED ORDER — TRIAMCINOLONE ACETONIDE 40 MG/ML IJ SUSP
40.0000 mg | Freq: Once | INTRAMUSCULAR | Status: AC
  Administered 2023-02-15: 40 mg via INTRAMUSCULAR

## 2023-02-15 NOTE — Progress Notes (Signed)
   Acute Office Visit  Subjective:     Patient ID: Meredith Sanchez, female    DOB: July 11, 1966, 57 y.o.   MRN: [id number]  Chief Complaint  Patient presents with   Rash    Started 01/27/2023. Rash on legs and arms and constantly itching. Tried meds for itching and went to next care. Gave steroid and rash seemed to get worse.   [name] presents today for an acute visit for persistent rash on her arms and legs that constantly itches.  She reports onset of the rash on her lower extremities on 2/28.  She was prescribed fluocinonide cream and has also presented to urgent care in [place] where she has received clobetasol and betamethasone creams.  Despite routine application of multiple oral topical corticosteroid agents her rash has not improved.  She now has rash present on her arms.  The itching is worse at night and keeps her awake.  Hydroxyzine has not been active for itch relief.  She would like to receive a steroid injection today.  Review of Systems  Skin:  Positive for itching and rash.  All other systems reviewed and are negative.     Objective:    BP (!) 144/84   Pulse 92   Ht 5\' 6"  (1.676 m)   Wt 184 lb 9.6 oz (83.7 kg)   SpO2 90%   BMI 29.80 kg/m   Physical Exam Vitals reviewed.  Constitutional:      Appearance: Normal appearance.  Skin:    General: Skin is warm.     Findings: Rash (Well-demarcated, erythematous rash present over the upper and lower extremities bilaterally) present.  Neurological:     Mental Status: She is alert.       Assessment & Plan:   Problem List Items Addressed This Visit       Allergic contact dermatitis    Presenting today for evaluation of a rash.  The appearance of the rash seems most consistent with dermatitis.  He has recently been prescribed fluocinonide cream and reports that an urgent care in [place] has also prescribed clobetasol and betamethasone.  Hydroxyzine has also been prescribed for itch relief but has not been effective.  Recent  medication changes, trying different foods, changes to soaps/shampoos, lotions, or laundry detergent.  She has not used any topical steroids in 1 week because of concern that they will cause skin discoloration -Triamcinolone 40 mg IM injection administered today -I recommended that she only use one topical steroid and not combine the 3 that she has been prescribed. -Follow-up with PCP as needed if symptoms do not improve.  Given that this appears to be a recurrent issue, agree with previous referral to dermatology.      Meds ordered this encounter  Medications   triamcinolone acetonide (KENALOG-40) injection 40 mg    Return if symptoms worsen or fail to improve.  Johnette Abraham, MD

## 2023-02-15 NOTE — Patient Instructions (Signed)
It was a pleasure to see you today.  Thank you for giving [place] the opportunity to be involved in your care.  Below is a brief recap of your visit and next steps.  We will plan to see you again in May.  Summary Steroid injection to be administered today Follow up with Dr. Moshe Cipro if your symptoms are not improving.

## 2023-02-15 NOTE — Assessment & Plan Note (Signed)
Presenting today for evaluation of a rash.  The appearance of the rash seems most consistent with dermatitis.  He has recently been prescribed fluocinonide cream and reports that an urgent care in [place] has also prescribed clobetasol and betamethasone.  Hydroxyzine has also been prescribed for itch relief but has not been effective.  Recent medication changes, trying different foods, changes to soaps/shampoos, lotions, or laundry detergent.  She has not used any topical steroids in 1 week because of concern that they will cause skin discoloration -Triamcinolone 40 mg IM injection administered today -I recommended that she only use one topical steroid and not combine the 3 that she has been prescribed. -Follow-up with PCP as needed if symptoms do not improve.  Given that this appears to be a recurrent issue, agree with previous referral to dermatology.

## 2023-04-06 ENCOUNTER — Encounter: Payer: BC Managed Care – PPO | Admitting: Family Medicine

## 2023-04-09 ENCOUNTER — Ambulatory Visit (INDEPENDENT_AMBULATORY_CARE_PROVIDER_SITE_OTHER): Payer: BC Managed Care – PPO | Admitting: Family Medicine

## 2023-04-09 ENCOUNTER — Other Ambulatory Visit: Payer: Self-pay

## 2023-04-09 ENCOUNTER — Encounter: Payer: Self-pay | Admitting: Family Medicine

## 2023-04-09 VITALS — BP 112/68 | HR 80 | Ht 66.0 in | Wt 184.1 lb

## 2023-04-09 DIAGNOSIS — E559 Vitamin D deficiency, unspecified: Secondary | ICD-10-CM

## 2023-04-09 DIAGNOSIS — E785 Hyperlipidemia, unspecified: Secondary | ICD-10-CM | POA: Diagnosis not present

## 2023-04-09 DIAGNOSIS — I10 Essential (primary) hypertension: Secondary | ICD-10-CM

## 2023-04-09 DIAGNOSIS — R7303 Prediabetes: Secondary | ICD-10-CM | POA: Diagnosis not present

## 2023-04-09 DIAGNOSIS — Z0000 Encounter for general adult medical examination without abnormal findings: Secondary | ICD-10-CM

## 2023-04-09 NOTE — Patient Instructions (Signed)
F/U in 5 months, call if you need me sooner  I will message you with results  If Blood sugar is in the diabetic range, you will need to start metformin as we discussed  Excellent blood pressure  It is important that you exercise regularly at least 30 minutes 5 times a week. If you develop chest pain, have severe difficulty breathing, or feel very tired, stop exercising immediately and seek medical attention   Think about what you will eat, plan ahead. Choose " clean, green, fresh or frozen" over canned, processed or packaged foods which are more sugary, salty and fatty. 70 to 75% of food eaten should be vegetables and fruit. Three meals at set times with snacks allowed between meals, but they must be fruit or vegetables. Aim to eat over a 12 hour period , example 7 am to 7 pm, and STOP after  your last meal of the day. Drink water,generally about 64 ounces per day, no other drink is as healthy. Fruit juice is best enjoyed in a healthy way, by EATING the fruit. Thanks for choosing [facility], we consider it a privelige to serve you.

## 2023-04-10 LAB — HEMOGLOBIN A1C
Est. average glucose Bld gHb Est-mCnc: 128 mg/dL
Hgb A1c MFr Bld: 6.1 % — ABNORMAL HIGH (ref 4.8–5.6)

## 2023-04-10 LAB — CBC
Hematocrit: 36.4 % (ref 34.0–46.6)
Hemoglobin: 12.7 g/dL (ref 11.1–15.9)
MCH: 31.8 pg (ref 26.6–33.0)
MCHC: 34.9 g/dL (ref 31.5–35.7)
MCV: 91 fL (ref 79–97)
Platelets: 359 10*3/uL (ref 150–450)
RBC: 3.99 x10E6/uL (ref 3.77–5.28)
RDW: 13.7 % (ref 11.7–15.4)
WBC: 4.9 10*3/uL (ref 3.4–10.8)

## 2023-04-10 LAB — CMP14+EGFR
ALT: 20 IU/L (ref 0–32)
AST: 20 IU/L (ref 0–40)
Albumin/Globulin Ratio: 1.8 (ref 1.2–2.2)
Albumin: 4.7 g/dL (ref 3.8–4.9)
Alkaline Phosphatase: 61 IU/L (ref 44–121)
BUN/Creatinine Ratio: 25 — ABNORMAL HIGH (ref 9–23)
BUN: 25 mg/dL — ABNORMAL HIGH (ref 6–24)
Bilirubin Total: 0.7 mg/dL (ref 0.0–1.2)
CO2: 22 mmol/L (ref 20–29)
Calcium: 10.2 mg/dL (ref 8.7–10.2)
Chloride: 101 mmol/L (ref 96–106)
Creatinine, Ser: 1 mg/dL (ref 0.57–1.00)
Globulin, Total: 2.6 g/dL (ref 1.5–4.5)
Glucose: 105 mg/dL — ABNORMAL HIGH (ref 70–99)
Potassium: 4.3 mmol/L (ref 3.5–5.2)
Sodium: 139 mmol/L (ref 134–144)
Total Protein: 7.3 g/dL (ref 6.0–8.5)
eGFR: 65 mL/min/{1.73_m2} (ref >59)

## 2023-04-10 LAB — LIPID PANEL
Chol/HDL Ratio: 3.3 ratio (ref 0.0–4.4)
Cholesterol, Total: 169 mg/dL (ref 100–199)
HDL: 51 mg/dL (ref >39)
LDL Chol Calc (NIH): 91 mg/dL (ref 0–99)
Triglycerides: 153 mg/dL — ABNORMAL HIGH (ref 0–149)
VLDL Cholesterol Cal: 27 mg/dL (ref 5–40)

## 2023-04-10 LAB — VITAMIN D 25 HYDROXY (VIT D DEFICIENCY, FRACTURES): Vit D, 25-Hydroxy: 40.5 ng/mL (ref 30.0–100.0)

## 2023-04-10 LAB — TSH: TSH: 1.83 u[IU]/mL (ref 0.450–4.500)

## 2023-04-11 ENCOUNTER — Encounter: Payer: Self-pay | Admitting: Family Medicine

## 2023-04-11 DIAGNOSIS — Z0000 Encounter for general adult medical examination without abnormal findings: Secondary | ICD-10-CM | POA: Insufficient documentation

## 2023-04-11 NOTE — Progress Notes (Signed)
    Meredith Sanchez     MRN: [id number]      DOB: [birth year]  Chief Complaint  Patient presents with   Annual Exam    HPI: Patient is in for annual physical exam. No other health concerns are expressed or addressed at the visit. Labs drawn on day of visit, reviewed and ar good Immunization is reviewed , and  updated if needed.   PE: BP 112/68 (BP Location: Right Arm, Patient Position: Sitting, Cuff Size: Normal)   Pulse 80   Ht 5\' 6"  (1.676 m)   Wt 184 lb 1.3 oz (83.5 kg)   SpO2 95%   BMI 29.71 kg/m   Pleasant  female, alert and oriented x 3, in no cardio-pulmonary distress. Afebrile. HEENT No facial trauma or asymetry. Sinuses non tender.  Extra occullar muscles intact.. External ears normal, .minimal amount of cerumen , flaking in right outer ear canal, otherwise exam is normal Neck: supple, no adenopathy,JVD or thyromegaly.No bruits.  Chest: Clear to ascultation bilaterally.No crackles or wheezes. Non tender to palpation    Cardiovascular system; Heart sounds normal,  S1 and  S2 ,no S3.  Peripheral pulses normal.  Abdomen: Soft, non tender, no organomegaly or masses.       Musculoskeletal exam: Full ROM of spine, hips , shoulders and knees. No deformity ,swelling or crepitus noted. No muscle wasting or atrophy.   Neurologic: Cranial nerves 2 to 12 intact. Power, tone ,sensation and reflexes normal throughout. No disturbance in gait. No tremor.  Skin: Intact, no ulceration, erythema , scaling or rash noted. Pigmentation normal throughout  Psych; Normal mood and affect. Judgement and concentration normal   Assessment & Plan:  No problem-specific Assessment & Plan notes found for this encounter.

## 2023-04-11 NOTE — Assessment & Plan Note (Signed)
Annual exam as documented.  Changes in health habits are decided on by the patient with goals and time frames  set for achieving them. Immunization and cancer screening needs are specifically addressed at this visit.  

## 2023-09-10 ENCOUNTER — Ambulatory Visit (INDEPENDENT_AMBULATORY_CARE_PROVIDER_SITE_OTHER): Payer: Medicaid Other | Admitting: Family Medicine

## 2023-09-10 ENCOUNTER — Encounter: Payer: Self-pay | Admitting: Family Medicine

## 2023-09-10 VITALS — BP 128/80 | HR 90 | Ht 66.0 in | Wt 189.1 lb

## 2023-09-10 DIAGNOSIS — E7849 Other hyperlipidemia: Secondary | ICD-10-CM

## 2023-09-10 DIAGNOSIS — R7303 Prediabetes: Secondary | ICD-10-CM | POA: Diagnosis not present

## 2023-09-10 DIAGNOSIS — E785 Hyperlipidemia, unspecified: Secondary | ICD-10-CM

## 2023-09-10 DIAGNOSIS — R928 Other abnormal and inconclusive findings on diagnostic imaging of breast: Secondary | ICD-10-CM

## 2023-09-10 DIAGNOSIS — I10 Essential (primary) hypertension: Secondary | ICD-10-CM

## 2023-09-10 DIAGNOSIS — E66811 Obesity, class 1: Secondary | ICD-10-CM

## 2023-09-10 DIAGNOSIS — Z23 Encounter for immunization: Secondary | ICD-10-CM | POA: Diagnosis not present

## 2023-09-10 DIAGNOSIS — M21619 Bunion of unspecified foot: Secondary | ICD-10-CM

## 2023-09-10 NOTE — Patient Instructions (Signed)
Annual exam in May, call if you need me sooner  Labs today, lipid, cmp and eGFr and hBA1C  Ear exam is normal, stay with current ear plugs, likely offer better protection  Flu vaccine today  Covid vaccine at pharmacy next 1 to 2 weeks  Pls schedule the breast biopsy which is overdue  You are referred to Podiatry regarding bunions [facility])  It is important that you exercise regularly at least 30 minutes 5 times a week. If you develop chest pain, have severe difficulty breathing, or feel very tired, stop exercising immediately and seek medical attention   Think about what you will eat, plan ahead. Choose " clean, green, fresh or frozen" over canned, processed or packaged foods which are more sugary, salty and fatty. 70 to 75% of food eaten should be vegetables and fruit. Three meals at set times with snacks allowed between meals, but they must be fruit or vegetables. Aim to eat over a 12 hour period , example 7 am to 7 pm, and STOP after  your last meal of the day. Drink water,generally about 64 ounces per day, no other drink is as healthy. Fruit juice is best enjoyed in a healthy way, by EATING the fruit.  BEST for 2024/2025! Thanks for choosing [facility], we consider it a privelige to serve you.

## 2023-09-11 LAB — CMP14+EGFR
ALT: 34 [IU]/L — ABNORMAL HIGH (ref 0–32)
AST: 34 [IU]/L (ref 0–40)
Albumin: 4.5 g/dL (ref 3.8–4.9)
Alkaline Phosphatase: 67 [IU]/L (ref 44–121)
BUN/Creatinine Ratio: 23 (ref 9–23)
BUN: 27 mg/dL — ABNORMAL HIGH (ref 6–24)
Bilirubin Total: 0.5 mg/dL (ref 0.0–1.2)
CO2: 19 mmol/L — ABNORMAL LOW (ref 20–29)
Calcium: 9.7 mg/dL (ref 8.7–10.2)
Chloride: 104 mmol/L (ref 96–106)
Creatinine, Ser: 1.15 mg/dL — ABNORMAL HIGH (ref 0.57–1.00)
Globulin, Total: 2.7 g/dL (ref 1.5–4.5)
Glucose: 102 mg/dL — ABNORMAL HIGH (ref 70–99)
Potassium: 4 mmol/L (ref 3.5–5.2)
Sodium: 140 mmol/L (ref 134–144)
Total Protein: 7.2 g/dL (ref 6.0–8.5)
eGFR: 55 mL/min/{1.73_m2} — ABNORMAL LOW (ref >59)

## 2023-09-11 LAB — HEMOGLOBIN A1C
Est. average glucose Bld gHb Est-mCnc: 128 mg/dL
Hgb A1c MFr Bld: 6.1 % — ABNORMAL HIGH (ref 4.8–5.6)

## 2023-09-11 LAB — LIPID PANEL
Chol/HDL Ratio: 3 {ratio} (ref 0.0–4.4)
Cholesterol, Total: 140 mg/dL (ref 100–199)
HDL: 46 mg/dL (ref >39)
LDL Chol Calc (NIH): 73 mg/dL (ref 0–99)
Triglycerides: 116 mg/dL (ref 0–149)
VLDL Cholesterol Cal: 21 mg/dL (ref 5–40)

## 2023-09-21 ENCOUNTER — Other Ambulatory Visit: Payer: Self-pay

## 2023-09-21 DIAGNOSIS — M21619 Bunion of unspecified foot: Secondary | ICD-10-CM

## 2023-09-27 ENCOUNTER — Other Ambulatory Visit (HOSPITAL_COMMUNITY): Payer: Self-pay | Admitting: Family Medicine

## 2023-09-27 DIAGNOSIS — E7849 Other hyperlipidemia: Secondary | ICD-10-CM | POA: Insufficient documentation

## 2023-09-27 DIAGNOSIS — R921 Mammographic calcification found on diagnostic imaging of breast: Secondary | ICD-10-CM

## 2023-09-27 DIAGNOSIS — Z23 Encounter for immunization: Secondary | ICD-10-CM | POA: Insufficient documentation

## 2023-09-27 DIAGNOSIS — M21619 Bunion of unspecified foot: Secondary | ICD-10-CM | POA: Insufficient documentation

## 2023-09-27 NOTE — Assessment & Plan Note (Signed)
Refer podiatry painful

## 2023-09-27 NOTE — Progress Notes (Signed)
Meredith Sanchez     MRN: [id number]      DOB: 06/14/1966  Chief Complaint  Patient presents with   Care Management    5 month f/u. Would like to repeat labs on blood sugar levels.    HPI Meredith Sanchez is here for follow up and re-evaluation of chronic medical conditions, medication management and review of any available recent lab and radiology data.  Preventive health is updated, specifically  Cancer screening and Immunization.   Questions or concerns regarding consultations or procedures which the PT has had in the interim are  addressed. The PT denies any adverse reactions to current medications since the last visit.  C/o painful bunions bilaterally wants podiatry eval Wants blood sugar re  tested Has question as to whether she needs a different type of ear plug that just fits over her ear ROS Denies recent fever or chills. Denies sinus pressure, nasal congestion, ear pain or sore throat. Denies chest congestion, productive cough or wheezing. Denies chest pains, palpitations and leg swelling Denies abdominal pain, nausea, vomiting,diarrhea or constipation.   Denies dysuria, frequency, hesitancy or incontinence. y. Denies headaches, seizures, numbness, or tingling. Denies depression, anxiety or insomnia. Denies skin break down or rash.   PE  BP 128/80   Pulse 90   Ht 5\' 6"  (1.676 m)   Wt 189 lb 1.3 oz (85.8 kg)   SpO2 96%   BMI 30.52 kg/m   Patient alert and oriented and in no cardiopulmonary distress.  HEENT: No facial asymmetry, EOMI,     Neck supple .Bilateral TM's clear , no cerumen impaction  Chest: Clear to auscultation bilaterally.  CVS: S1, S2 no murmurs, no S3.Regular rate.  ABD: Soft non tender.   Ext: No edema  MS: Adequate ROM spine, shoulders, hips and knees.  Skin: Intact, no ulcerations or rash noted.  Psych: Good eye contact, normal affect. Memory intact not anxious or depressed appearing.  CNS: CN 2-12 intact, power,  normal throughout.no  focal deficits noted.   Assessment & Plan  Essential hypertension Controlled, no change in medication DASH diet and commitment to daily physical activity for a minimum of 30 minutes discussed and encouraged, as a part of hypertension management. The importance of attaining a healthy weight is also discussed.     09/10/2023    1:23 PM 09/10/2023    1:02 PM 04/09/2023    1:35 PM 02/15/2023   11:23 AM 02/15/2023   11:22 AM 11/03/2022    3:36 PM 11/03/2022    2:54 PM  BP/Weight  Systolic BP 128 131 112 144 150 114 118  Diastolic BP 80 65 68 84 83 72 76  Wt. (Lbs)  189.08 184.08  184.6  184  BMI  30.52 kg/m2 29.71 kg/m2  29.8 kg/m2  29.7 kg/m2       Prediabetes Patient educated about the importance of limiting  Carbohydrate intake , the need to commit to daily physical activity for a minimum of 30 minutes , and to commit weight loss. The fact that changes in all these areas will reduce or eliminate all together the development of diabetes is stressed.  Unchanged     Latest Ref Rng & Units 09/10/2023    1:43 PM 04/09/2023    2:17 PM 11/03/2022   12:00 AM 06/05/2022    4:03 PM 01/28/2022    1:52 PM  Diabetic Labs  HbA1c 4.8 - 5.6 % 6.1  6.1  6.6  6.4  6.4   Chol  100 - 199 mg/dL 865  784    696   HDL >29 mg/dL 46  51    52   Calc LDL 0 - 99 mg/dL 73  91    82   Triglycerides 0 - 149 mg/dL 528  413    244   Creatinine 0.57 - 1.00 mg/dL 0.10  2.72  5.36  6.44  0.88       09/10/2023    1:23 PM 09/10/2023    1:02 PM 04/09/2023    1:35 PM 02/15/2023   11:23 AM 02/15/2023   11:22 AM 11/03/2022    3:36 PM 11/03/2022    2:54 PM  BP/Weight  Systolic BP 128 131 112 144 150 114 118  Diastolic BP 80 65 68 84 83 72 76  Wt. (Lbs)  189.08 184.08  184.6  184  BMI  30.52 kg/m2 29.71 kg/m2  29.8 kg/m2  29.7 kg/m2       No data to display            Obesity (BMI 30.0-34.9)  Patient re-educated about  the importance of commitment to a  minimum of 150 minutes of exercise per week as  able.  The importance of healthy food choices with portion control discussed, as well as eating regularly and within a 12 hour window most days. The need to choose "clean , green" food 50 to 75% of the time is discussed, as well as to make water the primary drink and set a goal of 64 ounces water daily.       09/10/2023    1:02 PM 04/09/2023    1:35 PM 02/15/2023   11:22 AM  Weight /BMI  Weight 189 lb 1.3 oz 184 lb 1.3 oz 184 lb 9.6 oz  Height 5\' 6"  (1.676 m) 5\' 6"  (1.676 m) 5\' 6"  (1.676 m)  BMI 30.52 kg/m2 29.71 kg/m2 29.8 kg/m2      Hyperlipidemia LDL goal <100 Hyperlipidemia:Low fat diet discussed and encouraged.   Lipid Panel  Lab Results  Component Value Date   CHOL 140 09/10/2023   HDL 46 09/10/2023   LDLCALC 73 09/10/2023   TRIG 116 09/10/2023   CHOLHDL 3.0 09/10/2023     Excellent, at goal  Bunion Refer podiatry painful   Abnormal mammogram of left breast Needs o proceed with brast biopsy, she is aware

## 2023-09-27 NOTE — Assessment & Plan Note (Signed)
Patient educated about the importance of limiting  Carbohydrate intake , the need to commit to daily physical activity for a minimum of 30 minutes , and to commit weight loss. The fact that changes in all these areas will reduce or eliminate all together the development of diabetes is stressed.  Unchanged     Latest Ref Rng & Units 09/10/2023    1:43 PM 04/09/2023    2:17 PM 11/03/2022   12:00 AM 06/05/2022    4:03 PM 01/28/2022    1:52 PM  Diabetic Labs  HbA1c 4.8 - 5.6 % 6.1  6.1  6.6  6.4  6.4   Chol 100 - 199 mg/dL 782  956    213   HDL >08 mg/dL 46  51    52   Calc LDL 0 - 99 mg/dL 73  91    82   Triglycerides 0 - 149 mg/dL 657  846    962   Creatinine 0.57 - 1.00 mg/dL 9.52  8.41  3.24  4.01  0.88       09/10/2023    1:23 PM 09/10/2023    1:02 PM 04/09/2023    1:35 PM 02/15/2023   11:23 AM 02/15/2023   11:22 AM 11/03/2022    3:36 PM 11/03/2022    2:54 PM  BP/Weight  Systolic BP 128 131 112 144 150 114 118  Diastolic BP 80 65 68 84 83 72 76  Wt. (Lbs)  189.08 184.08  184.6  184  BMI  30.52 kg/m2 29.71 kg/m2  29.8 kg/m2  29.7 kg/m2       No data to display

## 2023-09-27 NOTE — Assessment & Plan Note (Signed)
Controlled, no change in medication DASH diet and commitment to daily physical activity for a minimum of 30 minutes discussed and encouraged, as a part of hypertension management. The importance of attaining a healthy weight is also discussed.     09/10/2023    1:23 PM 09/10/2023    1:02 PM 04/09/2023    1:35 PM 02/15/2023   11:23 AM 02/15/2023   11:22 AM 11/03/2022    3:36 PM 11/03/2022    2:54 PM  BP/Weight  Systolic BP 128 131 112 144 150 114 118  Diastolic BP 80 65 68 84 83 72 76  Wt. (Lbs)  189.08 184.08  184.6  184  BMI  30.52 kg/m2 29.71 kg/m2  29.8 kg/m2  29.7 kg/m2

## 2023-09-27 NOTE — Assessment & Plan Note (Signed)
Hyperlipidemia:Low fat diet discussed and encouraged.   Lipid Panel  Lab Results  Component Value Date   CHOL 140 09/10/2023   HDL 46 09/10/2023   LDLCALC 73 09/10/2023   TRIG 116 09/10/2023   CHOLHDL 3.0 09/10/2023     Excellent, at goal

## 2023-09-27 NOTE — Assessment & Plan Note (Signed)
Needs o proceed with brast biopsy, she is aware

## 2023-09-27 NOTE — Assessment & Plan Note (Signed)
  Patient re-educated about  the importance of commitment to a  minimum of 150 minutes of exercise per week as able.  The importance of healthy food choices with portion control discussed, as well as eating regularly and within a 12 hour window most days. The need to choose "clean , green" food 50 to 75% of the time is discussed, as well as to make water the primary drink and set a goal of 64 ounces water daily.       09/10/2023    1:02 PM 04/09/2023    1:35 PM 02/15/2023   11:22 AM  Weight /BMI  Weight 189 lb 1.3 oz 184 lb 1.3 oz 184 lb 9.6 oz  Height 5\' 6"  (1.676 m) 5\' 6"  (1.676 m) 5\' 6"  (1.676 m)  BMI 30.52 kg/m2 29.71 kg/m2 29.8 kg/m2

## 2023-10-26 ENCOUNTER — Ambulatory Visit (HOSPITAL_COMMUNITY)
Admission: RE | Admit: 2023-10-26 | Discharge: 2023-10-26 | Disposition: A | Discharge status: Home / Self Care | Payer: Medicaid Other | Source: Ambulatory Visit | Attending: Family Medicine | Admitting: Family Medicine

## 2023-10-26 ENCOUNTER — Other Ambulatory Visit (HOSPITAL_COMMUNITY): Payer: Self-pay | Admitting: Family Medicine

## 2023-10-26 DIAGNOSIS — R921 Mammographic calcification found on diagnostic imaging of breast: Secondary | ICD-10-CM | POA: Diagnosis present

## 2023-10-26 DIAGNOSIS — R928 Other abnormal and inconclusive findings on diagnostic imaging of breast: Secondary | ICD-10-CM

## 2023-11-01 ENCOUNTER — Other Ambulatory Visit (HOSPITAL_COMMUNITY): Payer: Self-pay | Admitting: Family Medicine

## 2023-11-01 DIAGNOSIS — R928 Other abnormal and inconclusive findings on diagnostic imaging of breast: Secondary | ICD-10-CM

## 2023-11-02 ENCOUNTER — Encounter (HOSPITAL_COMMUNITY): Payer: Self-pay

## 2023-11-02 ENCOUNTER — Ambulatory Visit (HOSPITAL_COMMUNITY)
Admission: RE | Admit: 2023-11-02 | Discharge: 2023-11-02 | Disposition: A | Discharge status: Home / Self Care | Payer: Medicaid Other | Source: Ambulatory Visit | Attending: Family Medicine | Admitting: Family Medicine

## 2023-11-02 DIAGNOSIS — R928 Other abnormal and inconclusive findings on diagnostic imaging of breast: Secondary | ICD-10-CM

## 2023-11-02 HISTORY — PX: BREAST BIOPSY: SHX20

## 2023-11-02 MED ORDER — LIDOCAINE-EPINEPHRINE (PF) 1 %-1:200000 IJ SOLN
10.0000 mL | Freq: Once | INTRAMUSCULAR | Status: AC
  Administered 2023-11-02: 10 mL via INTRADERMAL

## 2023-11-02 MED ORDER — LIDOCAINE HCL (PF) 2 % IJ SOLN
10.0000 mL | Freq: Once | INTRAMUSCULAR | Status: AC
  Administered 2023-11-02: 10 mL

## 2023-11-02 MED ORDER — LIDOCAINE HCL (PF) 2 % IJ SOLN
INTRAMUSCULAR | Status: AC
  Filled 2023-11-02: qty 10

## 2023-11-02 MED ORDER — LIDOCAINE-EPINEPHRINE (PF) 1 %-1:200000 IJ SOLN
INTRAMUSCULAR | Status: AC
  Filled 2023-11-02: qty 30

## 2023-11-02 NOTE — Progress Notes (Signed)
PT tolerated left breast biopsy well today with NAD noted. PT verbalized understanding of discharge instructions. PT ambulated back to the mammogram area this time and give ice packs to use.

## 2023-11-03 ENCOUNTER — Other Ambulatory Visit: Payer: Self-pay | Admitting: Family Medicine

## 2023-11-03 DIAGNOSIS — R921 Mammographic calcification found on diagnostic imaging of breast: Secondary | ICD-10-CM

## 2023-11-05 LAB — SURGICAL PATHOLOGY

## 2023-11-15 DIAGNOSIS — C50212 Malignant neoplasm of upper-inner quadrant of left female breast: Secondary | ICD-10-CM | POA: Insufficient documentation

## 2023-11-15 NOTE — Progress Notes (Signed)
[facility] [address] [place] [address]   Clinic Day:  11/16/2023  Referring physician: Kerri Perches, MD  Patient Care Team: Kerri Perches, MD as PCP - General   ASSESSMENT & PLAN:   Assessment:  1.  T1 cN0 G3 ER/PR/HER2-left breast IDC: - Screening mammogram on 10/20/2022: Abnormal - Left breast diagnostic mammogram/ultrasound (10/26/2023): Linear oriented pleomorphic calcifications in the lower inner left breast span 5.9 cm.  There has been interval development of irregular mass associated with these calcifications in the lower inner left breast.  Ultrasound of the left breast shows irregular hypoechoic mass in the left breast at 9 o'clock position measuring 2 x 1.3 x 1.6 cm.  No abnormal lymph nodes in the left axilla. - Left breast 9:00 biopsy (11/02/2023): Poorly differentiated IDC, grade 3, ER/PR negative, HER2 (1+) by IHC.  Ki-67: 20%.  2. Social/Family History: -Lives at home with her daughter. Works full-time with mild physical activity at work. No tobacco use.  -Brother died of lung cancer in his early 42's with a history of tobacco use. No other family history of cancer.  Plan:  1.  T1 cN0 G3 triple negative left breast IDC: - We reviewed imaging and pathology reports in detail. - Recommend stereotactic biopsy of the pleomorphic calcifications in the lower inner left breast spanning 5.9 cm. - If the stereotactic biopsy is negative for malignancy, she may proceed with surgery without neoadjuvant chemotherapy. - She will see Dr. Henreitta Leber to discuss surgery options. - Recommend genetic testing. - Recommend PET CT scan for metastatic disease evaluation.  Also recommend 2D echocardiogram to evaluate for baseline ejection fraction as she had a history of MI 5 years ago.  2.  Peripheral neuropathy: - She has tingling/numbness in the toes for the last 5 years, worse at nighttime.  No neuropathic pains.   Orders Placed This Encounter   Procedures   NM PET Image Initial (PI) Skull Base To Thigh    Standing Status:   Future    Expected Date:   11/23/2023    Expiration Date:   11/15/2024    If indicated for the ordered procedure, I authorize the administration of a radiopharmaceutical per Radiology protocol:   Yes    Is the patient pregnant?:   No    Preferred imaging location?:   [facility]    Release to patient:   Immediate   CBC with Differential    Standing Status:   Future    Number of Occurrences:   1    Expected Date:   11/16/2023    Expiration Date:   11/15/2024   Comprehensive metabolic panel    Standing Status:   Future    Number of Occurrences:   1    Expected Date:   11/16/2023    Expiration Date:   11/15/2024   Magnesium    Standing Status:   Future    Number of Occurrences:   1    Expected Date:   11/16/2023    Expiration Date:   11/15/2024   Genetic Screening Order    Standing Status:   Future    Number of Occurrences:   1    Expected Date:   11/16/2023    Expiration Date:   11/15/2024   Cancer antigen 15-3    Standing Status:   Future    Number of Occurrences:   1    Expected Date:   11/16/2023    Expiration Date:  11/15/2024   Cancer antigen 27.29    Standing Status:   Future    Number of Occurrences:   1    Expected Date:   11/16/2023    Expiration Date:   11/15/2024   ECHOCARDIOGRAM COMPLETE    Standing Status:   Future    Expected Date:   11/23/2023    Expiration Date:   11/15/2024    Where should this test be performed:   [facility]    Perflutren DEFINITY (image enhancing agent) should be administered unless hypersensitivity or allergy exist:   Administer Perflutren    Reason for exam-Echo:   Chemo  Z09    Release to patient:   Immediate      I,Helena R Teague,acting as a scribe for Doreatha Massed, MD.,have documented all relevant documentation on the behalf of Doreatha Massed, MD,as directed by  Doreatha Massed, MD while in the presence of Doreatha Massed,  MD.   I, Doreatha Massed MD, have reviewed the above documentation for accuracy and completeness, and I agree with the above.   Doreatha Massed, MD   12/17/20244:47 PM  CHIEF COMPLAINT/PURPOSE OF CONSULT:   Diagnosis: Triple negative breast cancer of upper-inner quadrant of left female breast   Cancer Staging  Breast cancer of upper-inner quadrant of left female breast [facility]) Staging form: Breast, AJCC 8th Edition - Clinical stage from 11/15/2023: Stage IB (cT1c, cN0, cM0, G3, ER-, PR-, HER2-) - Unsigned    Prior Therapy: none  Current Therapy: Under workup   HISTORY OF PRESENT ILLNESS:   Oncology History   No history exists.      Meredith Sanchez is a 57 y.o. female presenting to clinic today for evaluation of left breast cancer at the request of Kerri Perches, MD.  Patient underwent a US of the left breast and diagnostic bilateral MM on 10/26/23 after her last visit with her PCP that found: a highly suspicious 2 cm mass in the left breast at the 9 o'clock position with associated linear oriented calcifications, the mass with calcifications span 5.9 cm. She then had a left breast biopsy on 11/02/23 with pathology revealing: invasive poorly differentiated ductal adenocarcinoma, grade 3 with extensive necrosis and the tumor measuring 6.5 mm in greatest linear extent. The biopsy was negative for angiolymphatic invasion and microcalcifications. The tumor cells are negative for HER2 (1+), ER (0%), and PR (0%).   Today, she states that she is doing well overall. Her appetite level is at 100%. Her energy level is at 85%. She is accompanied by her sister.   She has an appointment with Dr. Henreitta Leber this afternoon and has 2 additional left breast biopsies scheduled tomorrow. Prior to MM she had not felt any abnormalities on the breasts. She does have a history of right breast biopsy that was benign in 2018. She had menarche at age 70 and a total hysterectomy with appendectomy at 26 for  fibroids in uterus. She denies any hot flashes or other menopausal symptoms. She has 3 daughters with her first childbirth at 33. She has used birth control for less than a year and never taken hormone replacements. She denies any new pains in the last few months. She does note tingling and numbness in the bilateral toes at night for the past 5 years after an MI in 2019. She does not have diabetes. She reports intermittent ankle swellings that she treats with compression socks.   PAST MEDICAL HISTORY:   Past Medical History: Past Medical History:  Diagnosis Date  Allergic rhinitis    Hyperlipidemia    Hypertension    Myocardial infarction [facility])    Obesity     Surgical History: Past Surgical History:  Procedure Laterality Date   ABDOMINAL HYSTERECTOMY     APPENDECTOMY  11/30/2001   BREAST BIOPSY Left 11/02/2023   path pending   BREAST BIOPSY Left 11/02/2023   [place] LT BREAST BX W LOC DEV 1ST LESION IMG BX SPEC US GUIDE 11/02/2023 [facility]-ULTRASOUND   COLONOSCOPY N/A 08/20/2014   Procedure: COLONOSCOPY;  Surgeon: West Bali, MD;  Location: [facility] SUITE;  Service: Endoscopy;  Laterality: N/A;  2:15pm   LEFT OOPHORECTOMY  11/30/2001   VESICOVAGINAL FISTULA CLOSURE W/ TAH  11/30/2001    Social History: Social History   Socioeconomic History   Marital status: Married    Spouse name: Not on file   Number of children: 3   Years of education: Not on file   Highest education level: Not on file  Occupational History   Occupation: employed     Comment: [employer]  Tobacco Use   Smoking status: Never   Smokeless tobacco: Never  Vaping Use   Vaping status: Never Used  Substance and Sexual Activity   Alcohol use: No   Drug use: No   Sexual activity: Not on file  Other Topics Concern   Not on file  Social History Narrative   Not on file   Social Drivers of Health   Financial Resource Strain: Not on file  Food Insecurity: Not on file  Transportation Needs: Not on file  Physical  Activity: Inactive (12/08/2018)   Received from [facility], [facility]   Exercise Vital Sign    Days of Exercise per Week: 0 days    Minutes of Exercise per Session: 0 min  Stress: Not on file  Social Connections: Not on file  Intimate Partner Violence: Not At Risk (12/08/2018)   Received from [facility], [facility]   Humiliation, Afraid, Rape, and Kick questionnaire    Fear of Current or Ex-Partner: No    Emotionally Abused: No    Physically Abused: No    Sexually Abused: No    Family History: Family History  Problem Relation Age of Onset   Diabetes Mother    Hypertension Mother    Hypertension Father    Brain cancer Brother 34       metastatic lung cancer to brain   Colon cancer Neg Hx     Current Medications:  Current Outpatient Medications:    amlodipine-olmesartan (AZOR) 10-20 MG tablet, Take 1 tablet by mouth daily., Disp: 90 tablet, Rfl: 3   ASPIRIN 81 PO, Take 1 tablet by mouth daily., Disp: , Rfl:    atorvastatin (LIPITOR) 40 MG tablet, TAKE 1 TABLET(40 MG) BY MOUTH DAILY, Disp: 90 tablet, Rfl: 3   Multiple Vitamin (MULTIVITAMIN) tablet, Take 1 tablet by mouth daily., Disp: , Rfl:    triamterene-hydrochlorothiazide (MAXZIDE-25) 37.5-25 MG tablet, Take 1 tablet by mouth daily., Disp: 90 tablet, Rfl: 3   Allergies: Allergies  Allergen Reactions   Ace Inhibitors Shortness Of Breath    Roof of mouth  Tingling, throat closing   Penicillins     Too strong  Too strong    Poison Ivy Extract Rash    rash    REVIEW OF SYSTEMS:   Review of Systems  Constitutional:  Negative for chills, fatigue and fever.  HENT:   Negative for lump/mass, mouth sores, nosebleeds, sore throat  and trouble swallowing.   Eyes:  Negative for eye problems.  Respiratory:  Negative for cough and shortness of breath.   Cardiovascular:  Negative for chest pain, leg swelling and palpitations.  Gastrointestinal:  Negative for abdominal pain, constipation, diarrhea, nausea and  vomiting.  Genitourinary:  Negative for bladder incontinence, difficulty urinating, dysuria, frequency, hematuria and nocturia.   Musculoskeletal:  Negative for arthralgias, back pain, flank pain, myalgias and neck pain.  Skin:  Negative for itching and rash.  Neurological:  Negative for dizziness, headaches and numbness.       +tingling feet  Hematological:  Does not bruise/bleed easily.  Psychiatric/Behavioral:  Negative for depression, sleep disturbance and suicidal ideas. The patient is not nervous/anxious.   All other systems reviewed and are negative.    VITALS:   Blood pressure (!) 150/64, pulse 80, temperature (!) 97.1 F (36.2 C), temperature source Oral, resp. rate 17, height 5\' 6"  (1.676 m), weight 184 lb 3.2 oz (83.6 kg), SpO2 100%.  Wt Readings from Last 3 Encounters:  11/16/23 183 lb (83 kg)  11/16/23 184 lb 3.2 oz (83.6 kg)  09/10/23 189 lb 1.3 oz (85.8 kg)    Body mass index is 29.73 kg/m.  Performance status (ECOG): 1 - Symptomatic but completely ambulatory  PHYSICAL EXAM:   Physical Exam Vitals and nursing note reviewed. Exam conducted with a chaperone present.  Constitutional:      Appearance: Normal appearance.  Cardiovascular:     Rate and Rhythm: Normal rate and regular rhythm.     Pulses: Normal pulses.     Heart sounds: Normal heart sounds.  Pulmonary:     Effort: Pulmonary effort is normal.     Breath sounds: Normal breath sounds.  Chest:     Comments: +freely mobile 2 cm mass in upper inner quadrant of left breast Abdominal:     Palpations: Abdomen is soft. There is no hepatomegaly, splenomegaly or mass.     Tenderness: There is no abdominal tenderness.  Musculoskeletal:     Right lower leg: No edema.     Left lower leg: No edema.  Lymphadenopathy:     Cervical: No cervical adenopathy.     Right cervical: No superficial, deep or posterior cervical adenopathy.    Left cervical: No superficial, deep or posterior cervical adenopathy.     Upper  Body:     Right upper body: No supraclavicular or axillary adenopathy.     Left upper body: No supraclavicular or axillary adenopathy.  Neurological:     General: No focal deficit present.     Mental Status: She is alert and oriented to person, place, and time.  Psychiatric:        Mood and Affect: Mood normal.        Behavior: Behavior normal.   Breast Exam Chaperone: Kennith Gain, RN   LABS:      Latest Ref Rng & Units 11/16/2023    8:56 AM 04/09/2023    2:17 PM 01/28/2022    1:52 PM  CBC  WBC 4.0 - 10.5 K/uL 7.9  4.9  5.3   Hemoglobin 12.0 - 15.0 g/dL 54.0  98.1  19.1   Hematocrit 36.0 - 46.0 % 40.6  36.4  38.4   Platelets 150 - 400 K/uL 355  359  361       Latest Ref Rng & Units 11/16/2023    8:56 AM 09/10/2023    1:43 PM 04/09/2023    2:17 PM  CMP  Glucose 70 -  99 mg/dL 295  188  416   BUN 6 - 20 mg/dL 27  27  25    Creatinine 0.44 - 1.00 mg/dL 6.06  3.01  6.01   Sodium 135 - 145 mmol/L 137  140  139   Potassium 3.5 - 5.1 mmol/L 3.1  4.0  4.3   Chloride 98 - 111 mmol/L 100  104  101   CO2 22 - 32 mmol/L 25  19  22    Calcium 8.9 - 10.3 mg/dL 09.3  9.7  23.5   Total Protein 6.5 - 8.1 g/dL 8.2  7.2  7.3   Total Bilirubin <1.2 mg/dL 0.8  0.5  0.7   Alkaline Phos 38 - 126 U/L 52  67  61   AST 15 - 41 U/L 25  34  20   ALT 0 - 44 U/L 28  34  20      No results found for: "CEA1", "CEA" / No results found for: "CEA1", "CEA" No results found for: "PSA1" No results found for: "TDD220" No results found for: "CAN125"  No results found for: "TOTALPROTELP", "ALBUMINELP", "A1GS", "A2GS", "BETS", "BETA2SER", "GAMS", "MSPIKE", "SPEI" Lab Results  Component Value Date   TIBC 344 08/17/2014   FERRITIN 348 (H) 08/17/2014   FERRITIN 134 07/09/2014   IRONPCTSAT 28 08/17/2014   No results found for: "LDH"   STUDIES:   [place] LT BREAST BX W LOC DEV 1ST LESION IMG BX SPEC US GUIDE Addendum Date: 11/03/2023 ADDENDUM REPORT: 11/03/2023 15:27 ADDENDUM: PATHOLOGY revealed: A. LEFT  BREAST, MASS @ 9:00, NEEDLE CORE BIOPSY: Invasive poorly differentiated ductal adenocarcinoma, grade 3. with extensive necrosis. Overall grade: Grade 3. Negative for angiolymphatic invasion. Negative for microcalcifications Tumor measures 6.5 mm in greatest linear extent. Pathology results are CONCORDANT with imaging findings, per Dr. Edwin Cap. Pathology results and recommendations were discussed with patient via telephone on 11/03/2023 by Randa Lynn RN. Patient reported biopsy site doing well with no adverse symptoms, and only slight tenderness at the site. Post biopsy care instructions were reviewed, questions were answered and my direct phone number was provided. Patient was instructed to call [facility]ography Department for any additional questions or concerns related to biopsy site. RECOMMENDATIONS: 1. Please schedule patient for Stereotactic guided LEFT biopsy x 2; of the most anterior and posterior extent of the calcifications in the inner left breast. Randa Lynn RN sent email message to [facility] schedulers on 11/03/2023 and patient has biopsies scheduled for 11/17/2023 at 10:30 AM. 2. Surgical and oncological consultations. Randa Lynn RN arranged appointment for patient to see surgeon (Dr. Algis Greenhouse) on December 17th at 11:30 AM. Randa Lynn RN also scheduled oncology appointment for patient to see Dr. Doreatha Massed (oncologist) at [facility] on November 16, 2023 at 8:00 AM. Patient is aware of surgeon, oncology and biopsy appointment dates and times. 3. Consider pre-treatment bilateral breast MRI to determine extent of disease if breast sparing surgery is being considered. Pathology results reported by Randa Lynn RN on 11/03/2023. Electronically Signed   By: Edwin Cap M.D.   On: 11/03/2023 15:27   Result Date: 11/03/2023 CLINICAL DATA:  Patient presents for for ultrasound-guided core biopsy of a 2 cm mass in the left breast  at the 9 o'clock position. EXAM: ULTRASOUND GUIDED LEFT BREAST CORE NEEDLE BIOPSY COMPARISON:  Previous exam(s). PROCEDURE: I met with the patient and we discussed the procedure of ultrasound-guided biopsy, including benefits and alternatives. We discussed  the high likelihood of a successful procedure. We discussed the risks of the procedure, including infection, bleeding, tissue injury, clip migration, and inadequate sampling. Informed written consent was given. The usual time-out protocol was performed immediately prior to the procedure. Lesion quadrant: Lower inner Using sterile technique and 1% Lidocaine as local anesthetic, under direct ultrasound visualization, a 14 gauge spring-loaded device was used to perform biopsy of the mass in the left breast at the 9 o'clock position using a medial to lateral approach. At the conclusion of the procedure a ribbon shaped tissue marker clip was deployed into the biopsy cavity. Follow up 2 view mammogram was performed and dictated separately. IMPRESSION: Ultrasound guided biopsy of the mass in the left breast the 9 o'clock position. No apparent complications. Electronically Signed: By: Edwin Cap M.D. On: 11/02/2023 13:41   MM CLIP PLACEMENT LEFT Result Date: 11/02/2023 CLINICAL DATA:  Post ultrasound-guided core biopsy of a mass in the left breast at the 9 o'clock position. EXAM: 3D DIAGNOSTIC LEFT MAMMOGRAM POST ULTRASOUND BIOPSY COMPARISON:  Previous exam(s). FINDINGS: 3D Mammographic images were obtained following ultrasound-guided core biopsy of a mass in the left breast at the 9 o'clock position. A ribbon shaped biopsy marking clip is present at the site of the biopsied mass in the left breast at the position. IMPRESSION: Ribbon shaped biopsy marking clip at site of biopsied mass in the left breast at the 9 o'clock position. Final Assessment: Post Procedure Mammograms for Marker Placement Electronically Signed   By: Edwin Cap M.D.   On: 11/02/2023  13:43   MM 3D DIAGNOSTIC MAMMOGRAM BILATERAL BREAST Result Date: 10/26/2023 CLINICAL DATA:  57 year old female with left breast calcifications initially identified on mammography 10/11/2019 with biopsy recommended at that time due to suspicious morphology. Patient has declined biopsy with subsequent follow-up diagnostic mammography demonstrating progression of these calcifications with increasing suspicious appearance for malignancy. The patient returns again today for follow-up and has yet to have any of the recommended left breast biopsies. EXAM: DIGITAL DIAGNOSTIC BILATERAL MAMMOGRAM WITH TOMOSYNTHESIS AND CAD; ULTRASOUND LEFT BREAST LIMITED TECHNIQUE: Bilateral digital diagnostic mammography and breast tomosynthesis was performed. The images were evaluated with computer-aided detection. ; Targeted ultrasound examination of the left breast was performed. COMPARISON:  Previous exam(s). ACR Breast Density Category c: The breasts are heterogeneously dense, which may obscure small masses. FINDINGS: Linear oriented pleomorphic calcifications in the lower inner left breast span 5.9 cm. There is been interval development of an irregular mass associated with these calcifications in the lower inner left breast anterior depth measuring 1.4 cm. In addition, there is increasing density associated with the more posteriorly positioned calcifications in the lower inner left breast. There is progressive tethering/flattening of the left nipple. No suspicious masses or calcifications are seen in the right breast. Targeted ultrasound of the left breast was performed. There is an irregular hypoechoic mass in the left breast at 9 o'clock 3 cm from nipple measuring 2 x 1.3 x 1.6 cm. This corresponds well with the mass seen in the inner left breast at mammography. No additional definite masses identified in the inner left breast sonographically. No abnormal lymph nodes identified in the left axilla. IMPRESSION: Highly suspicious 2  cm mass in the left breast at the 9 o'clock position with associated linear oriented calcifications. All together, the mass with calcifications span 5.9 cm. RECOMMENDATION: 1. Recommend ultrasound-guided core biopsy of the mass in the left breast at the 9 o'clock position. 2. Recommend stereotactic guided biopsy of the most anterior  and posterior extent of the calcifications in the inner left breast. The patient has agreed to start with the ultrasound-guided core biopsy of the left breast mass and this will be scheduled for the patient before she leaves the breast Center today. I have discussed the findings and recommendations with the patient. If applicable, a reminder letter will be sent to the patient regarding the next appointment. BI-RADS CATEGORY  5: Highly suggestive of malignancy. Electronically Signed   By: Edwin Cap M.D.   On: 10/26/2023 14:24   [place] LIMITED ULTRASOUND INCLUDING AXILLA LEFT BREAST  Result Date: 10/26/2023 CLINICAL DATA:  57 year old female with left breast calcifications initially identified on mammography 10/11/2019 with biopsy recommended at that time due to suspicious morphology. Patient has declined biopsy with subsequent follow-up diagnostic mammography demonstrating progression of these calcifications with increasing suspicious appearance for malignancy. The patient returns again today for follow-up and has yet to have any of the recommended left breast biopsies. EXAM: DIGITAL DIAGNOSTIC BILATERAL MAMMOGRAM WITH TOMOSYNTHESIS AND CAD; ULTRASOUND LEFT BREAST LIMITED TECHNIQUE: Bilateral digital diagnostic mammography and breast tomosynthesis was performed. The images were evaluated with computer-aided detection. ; Targeted ultrasound examination of the left breast was performed. COMPARISON:  Previous exam(s). ACR Breast Density Category c: The breasts are heterogeneously dense, which may obscure small masses. FINDINGS: Linear oriented pleomorphic calcifications in the lower  inner left breast span 5.9 cm. There is been interval development of an irregular mass associated with these calcifications in the lower inner left breast anterior depth measuring 1.4 cm. In addition, there is increasing density associated with the more posteriorly positioned calcifications in the lower inner left breast. There is progressive tethering/flattening of the left nipple. No suspicious masses or calcifications are seen in the right breast. Targeted ultrasound of the left breast was performed. There is an irregular hypoechoic mass in the left breast at 9 o'clock 3 cm from nipple measuring 2 x 1.3 x 1.6 cm. This corresponds well with the mass seen in the inner left breast at mammography. No additional definite masses identified in the inner left breast sonographically. No abnormal lymph nodes identified in the left axilla. IMPRESSION: Highly suspicious 2 cm mass in the left breast at the 9 o'clock position with associated linear oriented calcifications. All together, the mass with calcifications span 5.9 cm. RECOMMENDATION: 1. Recommend ultrasound-guided core biopsy of the mass in the left breast at the 9 o'clock position. 2. Recommend stereotactic guided biopsy of the most anterior and posterior extent of the calcifications in the inner left breast. The patient has agreed to start with the ultrasound-guided core biopsy of the left breast mass and this will be scheduled for the patient before she leaves the breast Center today. I have discussed the findings and recommendations with the patient. If applicable, a reminder letter will be sent to the patient regarding the next appointment. BI-RADS CATEGORY  5: Highly suggestive of malignancy. Electronically Signed   By: Edwin Cap M.D.   On: 10/26/2023 14:24

## 2023-11-16 ENCOUNTER — Ambulatory Visit: Payer: Medicaid Other | Admitting: General Surgery

## 2023-11-16 ENCOUNTER — Inpatient Hospital Stay: Payer: Medicaid Other | Attending: Hematology | Admitting: Hematology

## 2023-11-16 ENCOUNTER — Inpatient Hospital Stay: Payer: Medicaid Other

## 2023-11-16 ENCOUNTER — Telehealth: Payer: Self-pay | Admitting: Licensed Clinical Social Worker

## 2023-11-16 VITALS — BP 130/82 | HR 76 | Temp 97.9°F | Resp 12 | Ht 66.0 in | Wt 183.0 lb

## 2023-11-16 VITALS — BP 150/64 | HR 80 | Temp 97.1°F | Resp 17 | Ht 66.0 in | Wt 184.2 lb

## 2023-11-16 DIAGNOSIS — C50212 Malignant neoplasm of upper-inner quadrant of left female breast: Secondary | ICD-10-CM

## 2023-11-16 DIAGNOSIS — Z171 Estrogen receptor negative status [ER-]: Secondary | ICD-10-CM | POA: Insufficient documentation

## 2023-11-16 DIAGNOSIS — G629 Polyneuropathy, unspecified: Secondary | ICD-10-CM | POA: Insufficient documentation

## 2023-11-16 LAB — CBC WITH DIFFERENTIAL/PLATELET
Abs Immature Granulocytes: 0.02 10*3/uL (ref 0.00–0.07)
Basophils Absolute: 0 10*3/uL (ref 0.0–0.1)
Basophils Relative: 0 %
Eosinophils Absolute: 0.1 10*3/uL (ref 0.0–0.5)
Eosinophils Relative: 2 %
HCT: 40.6 % (ref 36.0–46.0)
Hemoglobin: 13.5 g/dL (ref 12.0–15.0)
Immature Granulocytes: 0 %
Lymphocytes Relative: 25 %
Lymphs Abs: 2 10*3/uL (ref 0.7–4.0)
MCH: 32.3 pg (ref 26.0–34.0)
MCHC: 33.3 g/dL (ref 30.0–36.0)
MCV: 97.1 fL (ref 80.0–100.0)
Monocytes Absolute: 0.5 10*3/uL (ref 0.1–1.0)
Monocytes Relative: 7 %
Neutro Abs: 5.2 10*3/uL (ref 1.7–7.7)
Neutrophils Relative %: 66 %
Platelets: 355 10*3/uL (ref 150–400)
RBC: 4.18 MIL/uL (ref 3.87–5.11)
RDW: 13.6 % (ref 11.5–15.5)
WBC: 7.9 10*3/uL (ref 4.0–10.5)
nRBC: 0 % (ref 0.0–0.2)

## 2023-11-16 LAB — COMPREHENSIVE METABOLIC PANEL
ALT: 28 U/L (ref 0–44)
AST: 25 U/L (ref 15–41)
Albumin: 4.7 g/dL (ref 3.5–5.0)
Alkaline Phosphatase: 52 U/L (ref 38–126)
Anion gap: 12 (ref 5–15)
BUN: 27 mg/dL — ABNORMAL HIGH (ref 6–20)
CO2: 25 mmol/L (ref 22–32)
Calcium: 10.2 mg/dL (ref 8.9–10.3)
Chloride: 100 mmol/L (ref 98–111)
Creatinine, Ser: 1.22 mg/dL — ABNORMAL HIGH (ref 0.44–1.00)
GFR, Estimated: 51 mL/min — ABNORMAL LOW (ref >60)
Glucose, Bld: 119 mg/dL — ABNORMAL HIGH (ref 70–99)
Potassium: 3.1 mmol/L — ABNORMAL LOW (ref 3.5–5.1)
Sodium: 137 mmol/L (ref 135–145)
Total Bilirubin: 0.8 mg/dL (ref <1.2)
Total Protein: 8.2 g/dL — ABNORMAL HIGH (ref 6.5–8.1)

## 2023-11-16 LAB — GENETIC SCREENING ORDER

## 2023-11-16 LAB — MAGNESIUM: Magnesium: 2 mg/dL (ref 1.7–2.4)

## 2023-11-16 NOTE — Telephone Encounter (Signed)
[facility] genetic testing order placed today per message from [employer].  Lacy Duverney, MS, [facility] Genetic Counselor [email or web address] Phone: [phone]

## 2023-11-16 NOTE — Patient Instructions (Signed)
Will get your Biopsy results from 12/17, your ECHO results from 12/24, PET scan from 12/26 and you will see Dr. Ellin Saba on 1/2. You will need a port a catheter at the same times as breast surgery. Will potentially need an MRI of the breast, but will know more after the biopsy on 12/17.  Will need to know more about the extent of the breast cancer (size) before knowing if we need an MRI and if able to do a lumpectomy.

## 2023-11-16 NOTE — Patient Instructions (Addendum)
[facility]  Discharge Instructions  You were seen and examined today by Dr. Ellin Saba. Dr. Ellin Saba is a medical oncologist, meaning that he specializes in the treatment of cancer diagnoses. Dr. Ellin Saba discussed your past medical history, family history of cancers, and the events that led to you being here today.  You were referred to Dr. Ellin Saba due to a new diagnosis of Triple Negative Breast Cancer.  Dr. Ellin Saba has recommended additional labs today that will include genetic testing. Dr. Ellin Saba has also recommended a PET scan to ensure the stage of the cancer.  Please keep your visit with Dr. Henreitta Leber as well as your appointment for additional biopsy tomorrow.  Due to your heart history, Dr. Ellin Saba has ordered an ultrasound of your heart to obtain a baseline cardiac function.   Triple negative breast cancer is aggressive in nature, therefore you will need chemotherapy following surgery. You can proceed with surgery. The role of chemotherapy is to eliminate any microscopic cells and reduce your risk of cancer recurrence following surgery.  Follow-up as scheduled.  Thank you for choosing [facility] to provide your oncology and hematology care.   To afford each patient quality time with our provider, please arrive at least 15 minutes before your scheduled appointment time. You may need to reschedule your appointment if you arrive late (10 or more minutes). Arriving late affects you and other patients whose appointments are after yours.  Also, if you miss three or more appointments without notifying the office, you may be dismissed from the clinic at the provider's discretion.    Again, thank you for choosing [facility].  Our hope is that these requests will decrease the amount of time that you wait before being seen by our physicians.   If you have a lab appointment with the Cancer Center - please note  that after April 8th, all labs will be drawn in the cancer center.  You do not have to check in or register with the main entrance as you have in the past but will complete your check-in at the cancer center.            _____________________________________________________________  Should you have questions after your visit to [facility], please contact our office at [phone] and follow the prompts.  Our office hours are 8:00 a.m. to 4:30 p.m. Monday - Thursday and 8:00 a.m. to 2:30 p.m. Friday.  Please note that voicemails left after 4:00 p.m. may not be returned until the following business day.  We are closed weekends and all major holidays.  You do have access to a nurse 24-7, just call the main number to the clinic [phone] and do not press any options, hold on the line and a nurse will answer the phone.    For prescription refill requests, have your pharmacy contact our office and allow 72 hours.    Masks are no longer required in the cancer centers. If you would like for your care team to wear a mask while they are taking care of you, please let them know. You may have one support person who is at least 58 years old accompany you for your appointments.

## 2023-11-16 NOTE — Progress Notes (Unsigned)
[facility] History and Physical  Reason for Referral:*** Referring Physician: ***  Chief Complaint   New Patient (Initial Visit)     Meredith Sanchez is a 57 y.o. female.  HPI: ***.  The *** started *** and has had a duration of ***.  It is associated with ***.  The *** is improved with ***, and is made worse with ***.    Quality*** Context***  Past Medical History:  Diagnosis Date   Allergic rhinitis    Hyperlipidemia    Hypertension    Myocardial infarction [facility])    Obesity     Past Surgical History:  Procedure Laterality Date   ABDOMINAL HYSTERECTOMY     APPENDECTOMY  11/30/2001   BREAST BIOPSY Left 11/02/2023   path pending   BREAST BIOPSY Left 11/02/2023   [place] LT BREAST BX W LOC DEV 1ST LESION IMG BX SPEC US GUIDE 11/02/2023 AP-ULTRASOUND   COLONOSCOPY N/A 08/20/2014   Procedure: COLONOSCOPY;  Surgeon: West Bali, MD;  Location: [facility] ENDO SUITE;  Service: Endoscopy;  Laterality: N/A;  2:15pm   LEFT OOPHORECTOMY  11/30/2001   VESICOVAGINAL FISTULA CLOSURE W/ TAH  11/30/2001    Family History  Problem Relation Age of Onset   Diabetes Mother    Hypertension Mother    Hypertension Father    Brain cancer Brother 69       metastatic lung cancer to brain   Colon cancer Neg Hx     Social History   Tobacco Use   Smoking status: Never   Smokeless tobacco: Never  Vaping Use   Vaping status: Never Used  Substance Use Topics   Alcohol use: No   Drug use: No    Medications: {medication reviewed/display:3041432} Allergies as of 11/16/2023       Reactions   Ace Inhibitors Shortness Of Breath   Roof of mouth  Tingling, throat closing   Penicillins    Too strong  Too strong    Poison Ivy Extract Rash   rash        Medication List        Accurate as of November 16, 2023 11:50 AM. If you have any questions, ask your nurse or doctor.          amlodipine-olmesartan 10-20 MG tablet Commonly known as: AZOR Take 1 tablet by mouth  daily.   ASPIRIN 81 PO Take 1 tablet by mouth daily.   atorvastatin 40 MG tablet Commonly known as: LIPITOR TAKE 1 TABLET(40 MG) BY MOUTH DAILY   multivitamin tablet Take 1 tablet by mouth daily.   triamterene-hydrochlorothiazide 37.5-25 MG tablet Commonly known as: MAXZIDE-25 Take 1 tablet by mouth daily.         ROS:  {Review of Systems:30496}  Blood pressure 130/82, pulse 76, temperature 97.9 F (36.6 C), temperature source Oral, resp. rate 12, height 5\' 6"  (1.676 m), weight 183 lb (83 kg), SpO2 96%. Physical Exam  Results: Results for orders placed or performed in visit on 11/16/23 (from the past 48 hours)  Magnesium     Status: None   Collection Time: 11/16/23  8:56 AM  Result Value Ref Range   Magnesium 2.0 1.7 - 2.4 mg/dL    Comment: Performed at [facility], [address], [place], [place] [address]  Comprehensive metabolic panel     Status: Abnormal   Collection Time: 11/16/23  8:56 AM  Result Value Ref Range   Sodium 137 135 - 145 mmol/L   Potassium 3.1 (L)  3.5 - 5.1 mmol/L   Chloride 100 98 - 111 mmol/L   CO2 25 22 - 32 mmol/L   Glucose, Bld 119 (H) 70 - 99 mg/dL    Comment: Glucose reference range applies only to samples taken after fasting for at least 8 hours.   BUN 27 (H) 6 - 20 mg/dL   Creatinine, Ser 1.61 (H) 0.44 - 1.00 mg/dL   Calcium 09.6 8.9 - 04.5 mg/dL   Total Protein 8.2 (H) 6.5 - 8.1 g/dL   Albumin 4.7 3.5 - 5.0 g/dL   AST 25 15 - 41 U/L   ALT 28 0 - 44 U/L   Alkaline Phosphatase 52 38 - 126 U/L   Total Bilirubin 0.8 <1.2 mg/dL   GFR, Estimated 51 (L) >60 mL/min    Comment: (NOTE) Calculated using the CKD-EPI Creatinine Equation (2021)    Anion gap 12 5 - 15    Comment: Performed at [facility], [address] [place], [place] [address]  CBC with Differential     Status: None   Collection Time: 11/16/23  8:56 AM  Result Value Ref Range   WBC 7.9 4.0 - 10.5 K/uL   RBC 4.18 3.87 - 5.11 MIL/uL   Hemoglobin 13.5 12.0 - 15.0 g/dL    HCT 19.1 47.8 - 29.5 %   MCV 97.1 80.0 - 100.0 fL   MCH 32.3 26.0 - 34.0 pg   MCHC 33.3 30.0 - 36.0 g/dL   RDW 62.1 30.8 - 65.7 %   Platelets 355 150 - 400 K/uL   nRBC 0.0 0.0 - 0.2 %   Neutrophils Relative % 66 %   Neutro Abs 5.2 1.7 - 7.7 K/uL   Lymphocytes Relative 25 %   Lymphs Abs 2.0 0.7 - 4.0 K/uL   Monocytes Relative 7 %   Monocytes Absolute 0.5 0.1 - 1.0 K/uL   Eosinophils Relative 2 %   Eosinophils Absolute 0.1 0.0 - 0.5 K/uL   Basophils Relative 0 %   Basophils Absolute 0.0 0.0 - 0.1 K/uL   Immature Granulocytes 0 %   Abs Immature Granulocytes 0.02 0.00 - 0.07 K/uL    Comment: Performed at [facility], [address], [place], [place] [address]    ADDENDUM REPORT: 11/03/2023 15:27   ADDENDUM: PATHOLOGY revealed: A. LEFT BREAST, MASS @ 9:00, NEEDLE CORE BIOPSY: Invasive poorly differentiated ductal adenocarcinoma, grade 3. with extensive necrosis. Overall grade: Grade 3. Negative for angiolymphatic invasion. Negative for microcalcifications Tumor measures 6.5 mm in greatest linear extent.   Pathology results are CONCORDANT with imaging findings, per Dr. Edwin Cap.   Pathology results and recommendations were discussed with patient via telephone on 11/03/2023 by Randa Lynn RN. Patient reported biopsy site doing well with no adverse symptoms, and only slight tenderness at the site. Post biopsy care instructions were reviewed, questions were answered and my direct phone number was provided. Patient was instructed to call [facility] Mammography Department for any additional questions or concerns related to biopsy site.   RECOMMENDATIONS: 1. Please schedule patient for Stereotactic guided LEFT biopsy x 2; of the most anterior and posterior extent of the calcifications in the inner left breast. Randa Lynn RN sent email message to [facility] schedulers on 11/03/2023 and patient has biopsies scheduled for 11/17/2023 at  10:30 AM.   2. Surgical and oncological consultations. Randa Lynn RN arranged appointment for patient to see surgeon (Dr. Algis Greenhouse) on December 17th at 11:30 AM. Randa Lynn RN  also scheduled oncology appointment for patient to see Dr. Doreatha Massed (oncologist) at [facility] on November 16, 2023 at 8:00 AM. Patient is aware of surgeon, oncology and biopsy appointment dates and times.   3. Consider pre-treatment bilateral breast MRI to determine extent of disease if breast sparing surgery is being considered.   Pathology results reported by Randa Lynn RN on 11/03/2023.     Electronically Signed   By: Edwin Cap M.D.   On: 11/03/2023 15:27  CLINICAL DATA:  Patient presents for for ultrasound-guided core biopsy of a 2 cm mass in the left breast at the 9 o'clock position.   EXAM: ULTRASOUND GUIDED LEFT BREAST CORE NEEDLE BIOPSY   COMPARISON:  Previous exam(s).   PROCEDURE: I met with the patient and we discussed the procedure of ultrasound-guided biopsy, including benefits and alternatives. We discussed the high likelihood of a successful procedure. We discussed the risks of the procedure, including infection, bleeding, tissue injury, clip migration, and inadequate sampling. Informed written consent was given. The usual time-out protocol was performed immediately prior to the procedure.   Lesion quadrant: Lower inner   Using sterile technique and 1% Lidocaine as local anesthetic, under direct ultrasound visualization, a 14 gauge spring-loaded device was used to perform biopsy of the mass in the left breast at the 9 o'clock position using a medial to lateral approach. At the conclusion of the procedure a ribbon shaped tissue marker clip was deployed into the biopsy cavity. Follow up 2 view mammogram was performed and dictated separately.   IMPRESSION: Ultrasound guided biopsy of the mass in the left breast the 9 o'clock position. No apparent  complications.   Electronically Signed: By: Edwin Cap M.D. On: 11/02/2023 13:41     Assessment & Plan:  VANIYA HILLSMAN is a 57 y.o. female with *** -*** -*** -Follow up ***  All questions were answered to the satisfaction of the patient and family***.   Future Appointments  Date Time Provider Department Center  11/17/2023 10:30 AM GI-BCG PROCEDURES 1 GI-BCGMM GI-BREAST CE  11/17/2023 11:00 AM GI-BCG PROCEDURES 1 GI-BCGMM GI-BREAST CE  11/23/2023  8:30 AM AP - ECHO 1 OUTPATIENT AP-CARDIOPUL [facility]  11/25/2023  9:00 AM AP-PET CT 1 AP-NM [place] [facility]  12/02/2023  9:45 AM Doreatha Massed, MD [facility] None  12/02/2023  2:30 PM Lucretia Roers, MD [facility]-RS None  04/14/2024  1:00 PM Kerri Perches, MD [facility] R[facility]    The risk and benefits of *** were discussed including but not limited to ***.  After careful consideration, SHASHAWNA STECKEL has decided to ***.    Lucretia Roers 11/16/2023, 11:50 AM

## 2023-11-17 ENCOUNTER — Ambulatory Visit
Admission: RE | Admit: 2023-11-17 | Discharge: 2023-11-17 | Disposition: A | Discharge status: Home / Self Care | Payer: Medicaid Other | Source: Ambulatory Visit | Attending: Family Medicine | Admitting: Family Medicine

## 2023-11-17 ENCOUNTER — Other Ambulatory Visit: Payer: Self-pay | Admitting: Family Medicine

## 2023-11-17 DIAGNOSIS — R921 Mammographic calcification found on diagnostic imaging of breast: Secondary | ICD-10-CM

## 2023-11-17 HISTORY — PX: BREAST BIOPSY: SHX20

## 2023-11-18 ENCOUNTER — Ambulatory Visit: Payer: Medicaid Other | Admitting: General Surgery

## 2023-11-18 ENCOUNTER — Other Ambulatory Visit: Payer: Self-pay | Admitting: Family Medicine

## 2023-11-18 ENCOUNTER — Telehealth: Payer: Self-pay

## 2023-11-18 LAB — CANCER ANTIGEN 15-3: CA 15-3: 16.4 U/mL (ref 0.0–25.0)

## 2023-11-18 LAB — CANCER ANTIGEN 27.29: CA 27.29: 20.2 U/mL (ref 0.0–38.6)

## 2023-11-18 LAB — SURGICAL PATHOLOGY

## 2023-11-18 MED ORDER — GABAPENTIN 100 MG PO CAPS: 100.0000 mg | ORAL_CAPSULE | Freq: Every day | ORAL | 1 refills | Status: DC

## 2023-11-18 NOTE — Telephone Encounter (Signed)
  Ok to send not on patient current medication list ?      Copied from CRM [id number]. Topic: Clinical - Medication Refill >> Nov 18, 2023 12:03 PM Desma Mcgregor wrote: Most Recent Primary Care Visit:  Provider: Kerri Perches  Department: [facility]  Visit Type: OFFICE VISIT  Date: 09/10/2023  Medication: GABAPENTIN 100MG  Capsule. Requesting this medication due to pain in feet. Dr is aware per last visit  Has the patient contacted their pharmacy? No, has not had a Rx since 2021 (Agent: If no, request that the patient contact the pharmacy for the refill. If patient does not wish to contact the pharmacy document the reason why and proceed with request.) (Agent: If yes, when and what did the pharmacy advise?)   Is this the correct pharmacy for this prescription? Yes If no, delete pharmacy and type the correct one.  This is the patient's preferred pharmacy:  [facility] [phone] - [place], [place] - [address] AT [facility] [address] [place] [address] Phone: [phone] Fax: [phone]   Has the prescription been filled recently? No  Is the patient out of the medication? Yes  Has the patient been seen for an appointment in the last year OR does the patient have an upcoming appointment? Yes  Can we respond through MyChart? Yes  Agent: Please be advised that Rx refills may take up to 3 business days. We ask that you follow-up with your pharmacy.

## 2023-11-19 ENCOUNTER — Ambulatory Visit: Payer: Medicaid Other | Admitting: General Surgery

## 2023-11-22 ENCOUNTER — Ambulatory Visit: Payer: Medicaid Other | Admitting: General Surgery

## 2023-11-22 ENCOUNTER — Encounter: Payer: Self-pay | Admitting: *Deleted

## 2023-11-22 ENCOUNTER — Telehealth: Payer: Self-pay | Admitting: *Deleted

## 2023-11-22 DIAGNOSIS — Z171 Estrogen receptor negative status [ER-]: Secondary | ICD-10-CM | POA: Diagnosis not present

## 2023-11-22 DIAGNOSIS — C50212 Malignant neoplasm of upper-inner quadrant of left female breast: Secondary | ICD-10-CM | POA: Diagnosis not present

## 2023-11-22 NOTE — Telephone Encounter (Signed)
Received call from patient.   Patient requested work note that states that she will need to be out of work from 12/06/2023. Patient surgery is scheduled for 12/08/2023.   Ok to write note to start absence on 12/06/2023?  Please also clarify how long she will remain out of work (4 or 6 hours)?

## 2023-11-22 NOTE — H&P (Signed)
[facility] History and Physical   Reason for Referral: Left breast cancer  Referring Physician: Kerri Perches, MD      Chief Complaint   New Patient (Initial Visit)        Meredith Sanchez is a 57 y.o. female.  HPI: Meredith Sanchez is a 57 yo who is here today with her sister. She was recently diagnosed with triple negative breast cancer on the left breast after biopsy. She has seen Dr. Ellin Sanchez and will need adjuvant chemotherapy. He does not think she needs any neoadjuvant therapy. He has ordered PET scans and she is getting stereotactic biopsies of the anterior and posterior margins of the cancer due to further calcifications.    She had a heart attack 5 years ago but has no chest pain or SOB. She did not get any stents.        Past Medical History:  Diagnosis Date   Allergic rhinitis     Hyperlipidemia     Hypertension     Myocardial infarction [facility])     Obesity                 Past Surgical History:  Procedure Laterality Date   ABDOMINAL HYSTERECTOMY       APPENDECTOMY   11/30/2001   BREAST BIOPSY Left 11/02/2023    path pending   BREAST BIOPSY Left 11/02/2023    [place] LT BREAST BX W LOC DEV 1ST LESION IMG BX SPEC US GUIDE 11/02/2023 [facility]-ULTRASOUND   COLONOSCOPY N/A 08/20/2014    Procedure: COLONOSCOPY;  Surgeon: West Bali, MD;  Location: [facility] ENDO SUITE;  Service: Endoscopy;  Laterality: N/A;  2:15pm   LEFT OOPHORECTOMY   11/30/2001   VESICOVAGINAL FISTULA CLOSURE W/ TAH   11/30/2001               Family History  Problem Relation Age of Onset   Diabetes Mother     Hypertension Mother     Hypertension Father     Brain cancer Brother 24        metastatic lung cancer to brain   Colon cancer Neg Hx            Social History  Social History        Tobacco Use   Smoking status: Never   Smokeless tobacco: Never  Vaping Use   Vaping status: Never Used  Substance Use Topics   Alcohol use: No   Drug use: No        Medications: I  have reviewed the patient's current medications. Allergies as of 11/16/2023         Reactions    Ace Inhibitors Shortness Of Breath    Roof of mouth  Tingling, throat closing    Penicillins      Too strong  Too strong     Poison Ivy Extract Rash    rash            Medication List           Accurate as of November 16, 2023 11:50 AM. If you have any questions, ask your nurse or doctor.              amlodipine-olmesartan 10-20 MG tablet Commonly known as: AZOR Take 1 tablet by mouth daily.    ASPIRIN 81 PO Take 1 tablet by mouth daily.    atorvastatin 40 MG tablet Commonly known as: LIPITOR TAKE 1 TABLET(40 MG) BY MOUTH DAILY  multivitamin tablet Take 1 tablet by mouth daily.    triamterene-hydrochlorothiazide 37.5-25 MG tablet Commonly known as: MAXZIDE-25 Take 1 tablet by mouth daily.               ROS:  A comprehensive review of systems was negative except for: Cardiovascular: positive for HTN Integument/breast: positive for left breast cancer Allergic/Immunologic: positive for hay fever   Blood pressure 130/82, pulse 76, temperature 97.9 F (36.6 C), temperature source Oral, resp. rate 12, height 5\' 6"  (1.676 m), weight 183 lb (83 kg), SpO2 96%. Physical Exam Vitals reviewed.  Constitutional:      Appearance: She is normal weight.  HENT:     Head: Normocephalic.     Nose: Nose normal.  Eyes:     Extraocular Movements: Extraocular movements intact.  Cardiovascular:     Rate and Rhythm: Normal rate and regular rhythm.  Pulmonary:     Effort: Pulmonary effort is normal.     Breath sounds: Normal breath sounds.  Chest:  Breasts:    Right: No inverted nipple, mass or tenderness.     Left: Mass present. No inverted nipple or tenderness.     Comments: Some hardness medial left breast area just adjacent to the nipple could be hematoma  Abdominal:     General: There is no distension.     Palpations: Abdomen is soft.     Tenderness: There is no  abdominal tenderness.  Musculoskeletal:        General: No swelling.     Cervical back: Normal range of motion.  Lymphadenopathy:     Upper Body:     Right upper body: No supraclavicular or axillary adenopathy.     Left upper body: No supraclavicular or axillary adenopathy.  Skin:    General: Skin is warm.  Neurological:     General: No focal deficit present.     Mental Status: She is alert and oriented to person, place, and time.  Psychiatric:        Mood and Affect: Mood normal.        Behavior: Behavior normal.        Thought Content: Thought content normal.        Judgment: Judgment normal.        Results: Lab Results Last 48 Hours        Results for orders placed or performed in visit on 11/16/23 (from the past 48 hours)  Magnesium     Status: None    Collection Time: 11/16/23  8:56 AM  Result Value Ref Range    Magnesium 2.0 1.7 - 2.4 mg/dL      Comment: Performed at [facility], [address], [place], [place] [address]  Comprehensive metabolic panel     Status: Abnormal    Collection Time: 11/16/23  8:56 AM  Result Value Ref Range    Sodium 137 135 - 145 mmol/L    Potassium 3.1 (L) 3.5 - 5.1 mmol/L    Chloride 100 98 - 111 mmol/L    CO2 25 22 - 32 mmol/L    Glucose, Bld 119 (H) 70 - 99 mg/dL      Comment: Glucose reference range applies only to samples taken after fasting for at least 8 hours.    BUN 27 (H) 6 - 20 mg/dL    Creatinine, Ser 0.27 (H) 0.44 - 1.00 mg/dL    Calcium 25.3 8.9 - 66.4 mg/dL    Total Protein 8.2 (H) 6.5 - 8.1 g/dL  Albumin 4.7 3.5 - 5.0 g/dL    AST 25 15 - 41 U/L    ALT 28 0 - 44 U/L    Alkaline Phosphatase 52 38 - 126 U/L    Total Bilirubin 0.8 <1.2 mg/dL    GFR, Estimated 51 (L) >60 mL/min      Comment: (NOTE) Calculated using the CKD-EPI Creatinine Equation (2021)      Anion gap 12 5 - 15      Comment: Performed at [facility], [address] [place], [place] [address]  CBC with Differential     Status: None    Collection  Time: 11/16/23  8:56 AM  Result Value Ref Range    WBC 7.9 4.0 - 10.5 K/uL    RBC 4.18 3.87 - 5.11 MIL/uL    Hemoglobin 13.5 12.0 - 15.0 g/dL    HCT 41.3 24.4 - 01.0 %    MCV 97.1 80.0 - 100.0 fL    MCH 32.3 26.0 - 34.0 pg    MCHC 33.3 30.0 - 36.0 g/dL    RDW 27.2 53.6 - 64.4 %    Platelets 355 150 - 400 K/uL    nRBC 0.0 0.0 - 0.2 %    Neutrophils Relative % 66 %    Neutro Abs 5.2 1.7 - 7.7 K/uL    Lymphocytes Relative 25 %    Lymphs Abs 2.0 0.7 - 4.0 K/uL    Monocytes Relative 7 %    Monocytes Absolute 0.5 0.1 - 1.0 K/uL    Eosinophils Relative 2 %    Eosinophils Absolute 0.1 0.0 - 0.5 K/uL    Basophils Relative 0 %    Basophils Absolute 0.0 0.0 - 0.1 K/uL    Immature Granulocytes 0 %    Abs Immature Granulocytes 0.02 0.00 - 0.07 K/uL      Comment: Performed at [facility], [address] [place], [place] [address]        ADDENDUM REPORT: 11/03/2023 15:27   ADDENDUM: PATHOLOGY revealed: A. LEFT BREAST, MASS @ 9:00, NEEDLE CORE BIOPSY: Invasive poorly differentiated ductal adenocarcinoma, grade 3. with extensive necrosis. Overall grade: Grade 3. Negative for angiolymphatic invasion. Negative for microcalcifications Tumor measures 6.5 mm in greatest linear extent.   Pathology results are CONCORDANT with imaging findings, per Dr. Edwin Cap.   Pathology results and recommendations were discussed with patient via telephone on 11/03/2023 by Randa Lynn RN. Patient reported biopsy site doing well with no adverse symptoms, and only slight tenderness at the site. Post biopsy care instructions were reviewed, questions were answered and my direct phone number was provided. Patient was instructed to call [facility] Mammography Department for any additional questions or concerns related to biopsy site.   RECOMMENDATIONS: 1. Please schedule patient for Stereotactic guided LEFT biopsy x 2; of the most anterior and posterior extent of the calcifications  in the inner left breast. Randa Lynn RN sent email message to [facility] schedulers on 11/03/2023 and patient has biopsies scheduled for 11/17/2023 at 10:30 AM.   2. Surgical and oncological consultations. Randa Lynn RN arranged appointment for patient to see surgeon (Dr. Algis Greenhouse) on December 17th at 11:30 AM. Randa Lynn RN also scheduled oncology appointment for patient to see Dr. Doreatha Massed (oncologist) at [facility] on November 16, 2023 at 8:00 AM. Patient is aware of surgeon, oncology and biopsy appointment dates and times.   3. Consider pre-treatment bilateral breast MRI to determine extent of  disease if breast sparing surgery is being considered.   Pathology results reported by Randa Lynn RN on 11/03/2023.     Electronically Signed   By: Edwin Cap M.D.   On: 11/03/2023 15:27   CLINICAL DATA:  Patient presents for for ultrasound-guided core biopsy of a 2 cm mass in the left breast at the 9 o'clock position.   EXAM: ULTRASOUND GUIDED LEFT BREAST CORE NEEDLE BIOPSY   COMPARISON:  Previous exam(s).   PROCEDURE: I met with the patient and we discussed the procedure of ultrasound-guided biopsy, including benefits and alternatives. We discussed the high likelihood of a successful procedure. We discussed the risks of the procedure, including infection, bleeding, tissue injury, clip migration, and inadequate sampling. Informed written consent was given. The usual time-out protocol was performed immediately prior to the procedure.   Lesion quadrant: Lower inner   Using sterile technique and 1% Lidocaine as local anesthetic, under direct ultrasound visualization, a 14 gauge spring-loaded device was used to perform biopsy of the mass in the left breast at the 9 o'clock position using a medial to lateral approach. At the conclusion of the procedure a ribbon shaped tissue marker clip was deployed into the biopsy cavity. Follow  up 2 view mammogram was performed and dictated separately.   IMPRESSION: Ultrasound guided biopsy of the mass in the left breast the 9 o'clock position. No apparent complications.   Electronically Signed: By: Edwin Cap M.D. On: 11/02/2023 13:41       Assessment & Plan:  MADELEY ZAPALAC is a 57 y.o. female with triple negative breast cancer. She hs several procedures, an ECHO and PET scan scheduled. We discussed that  the extent of her disease may be so large that she may need a mastectomy due to the amount of breast tissue that would be removed. Also discussed if she wished for lumpectomy would probably need to do MRI and radiofrequency tag would need to be placed, likely 2 to bracket the area.    We will see what her stereotactic biopsy shows and this will give [place] more information.    Will get your Biopsy results from 12/17, your ECHO results from 12/24, PET scan from 12/26 and you will see Dr. Ellin Sanchez on 1/2. You will need a port a catheter at the same times as breast surgery. Will potentially need an MRI of the breast, but will know more after the biopsy on 12/17.  Will need to know more about the extent of the breast cancer (size) before knowing if we need an MRI and if able to do a lumpectomy.    We have discussed the options for surgery including the option of mastectomy with sentinel node biopsy versus partial mastectomy (lumpectomy) with sentinel node biopsy. We have discussed that there is no difference in the prognosis or chance or recurrence or differences in survival between the two options. We have discussed the need for radiation with the lumpectomy, and we have discussed that she will be referred to oncology after our procedure to further discuss her options for chemotherapy and hormonal therapy if she qualifies.    We have discussed that if she decides to have a lumpectomy that we will need to get a needle placed into the area where the biopsy was performed, since  we cannot palpate a mass. We have also discussed the need for injection of radiotracer and blue dye to perform the sentinel node biopsy.   We have discussed that the sentinel node biopsy tells  [place] if the cancer has spread to the lymph nodes, and can help with plans for chemotherapy treatment and overall prognosis.     We have discussed that if the lumpectomy does not remove the entire cancer that she may have to have an additional procedure, and we have discussed that a positive sentinel node can require further removal of lymph nodes from the axilla but that recent research does not show any improvement in disease free survival and carries greater risk for lymphedema.     We have discussed that these are big discussions, and that the risk from the operations are similar including risk of bleeding, risk of infection, and risk of needing additional surgeries. We have discussed the likely need for an overnight stay with a mastectomy and a drain that will remain in place for about 1 week.     Discussed port placement and risk of bleeding, infection, injury to vessels, and pneumothorax. Since she knows she will be getting chemotherapy due to the Triple Negative, we can proceed with this at the time of her breast surgery.            Future Appointments  Date Time Provider Department Center  11/17/2023 10:30 AM GI-[facility]G PROCEDURES 1 GI-BCGMM GI-BREAST CE  11/17/2023 11:00 AM [facility]G PROCEDURES 1 GI-BCGMM GI-BREAST CE  11/23/2023  8:30 AM AP - ECHO 1 OUTPATIENT AP-CARDIOPUL [place] [facility]  11/25/2023  9:00 AM AP-PET CT 1 AP-NM [place] [facility]  12/02/2023  9:45 AM Doreatha Massed, MD [facility] None  12/02/2023  2:30 PM Lucretia Roers, MD RS-RS None  04/14/2024  1:00 PM Meredith Perches, MD [facility] R[facility]    Will call and discuss things further.      Lucretia Roers 11/16/2023, 11:50 AM   Phone call 12/23:  [facility]   Talked to patient. Verified her DOB. She has had the  additional biopsies and the anterior and posterior margins have DCIS, high grade. All this spans 4.5-5cm. She has been thinking about her options.    Discussed that this is a large area and if she has any desire of lumpectomy we would have to get an MRI to know the extent of disease and she may not qualify or the result may have a very poor cosmetic appearance.   Discussed that she has decided to do mastectomy and SLNB. Discussed risk of bleeding, infection, recurrence, less likely need for radiation.   Discussed port placement at the same time. Discussed post operative care. Discussed admission overnight for observation. JP drain placement.    She will get her ECHO and PET this week.   Have let Dr. Ellin Sanchez know the plan.    Time for this call, documentation and coordination of care was 20 minutes.      Algis Greenhouse, MD [facility] [address] [place], [place] [address] [phone] (office)

## 2023-11-22 NOTE — Addendum Note (Signed)
Addended by: Phillips Odor on: 11/22/2023 01:19 PM   Modules accepted: Orders

## 2023-11-22 NOTE — Progress Notes (Signed)
[facility]  Talked to patient. Verified her DOB. She has had the additional biopsies and the anterior and posterior margins have DCIS, high grade. All this spans 4.5-5cm. She has been thinking about her options.   Discussed that this is a large area and if she has any desire of lumpectomy we would have to get an MRI to know the extent of disease and she may not qualify or the result may have a very poor cosmetic appearance.  Discussed that she has decided to do mastectomy and SLNB. Discussed risk of bleeding, infection, recurrence, less likely need for radiation.  Discussed port placement at the same time. Discussed post operative care. Discussed admission overnight for observation. JP drain placement.   She will get her ECHO and PET this week.  Have let Dr. Ellin Saba know the plan.   Time for this call, documentation and coordination of care was 20 minutes.    Algis Greenhouse, MD [facility] [address] [place], [place] [address] [phone] (office)

## 2023-11-23 ENCOUNTER — Ambulatory Visit (HOSPITAL_COMMUNITY)
Admission: RE | Admit: 2023-11-23 | Discharge: 2023-11-23 | Disposition: A | Discharge status: Home / Self Care | Payer: Medicaid Other | Source: Ambulatory Visit | Attending: Family Medicine | Admitting: Family Medicine

## 2023-11-23 DIAGNOSIS — Z171 Estrogen receptor negative status [ER-]: Secondary | ICD-10-CM | POA: Insufficient documentation

## 2023-11-23 DIAGNOSIS — C50212 Malignant neoplasm of upper-inner quadrant of left female breast: Secondary | ICD-10-CM | POA: Insufficient documentation

## 2023-11-23 DIAGNOSIS — Z0189 Encounter for other specified special examinations: Secondary | ICD-10-CM

## 2023-11-23 LAB — ECHOCARDIOGRAM COMPLETE
AR max vel: 2.17 cm2
AV Area VTI: 2.29 cm2
AV Area mean vel: 1.7 cm2
AV Mean grad: 3 mm[Hg]
AV Peak grad: 4.2 mm[Hg]
Ao pk vel: 1.02 m/s
Area-P 1/2: 2.36 cm2
Calc EF: 58 %
Est EF: 55
MV VTI: 1.93 cm2
S' Lateral: 3 cm
Single Plane A2C EF: 62.5 %
Single Plane A4C EF: 51.4 %

## 2023-11-23 NOTE — Progress Notes (Signed)
  Echocardiogram 2D Echocardiogram has been performed.  Ocie Doyne RDCS 11/23/2023, 8:57 AM

## 2023-11-25 ENCOUNTER — Encounter (HOSPITAL_COMMUNITY)
Admission: RE | Admit: 2023-11-25 | Discharge: 2023-11-25 | Disposition: A | Discharge status: Home / Self Care | Payer: Medicaid Other | Source: Ambulatory Visit | Attending: Hematology | Admitting: Hematology

## 2023-11-25 DIAGNOSIS — Z171 Estrogen receptor negative status [ER-]: Secondary | ICD-10-CM | POA: Insufficient documentation

## 2023-11-25 DIAGNOSIS — C50212 Malignant neoplasm of upper-inner quadrant of left female breast: Secondary | ICD-10-CM | POA: Insufficient documentation

## 2023-11-25 MED ORDER — FLUDEOXYGLUCOSE F - 18 (FDG) INJECTION
9.4400 | Freq: Once | INTRAVENOUS | Status: AC | PRN
  Administered 2023-11-25: 9.44 via INTRAVENOUS

## 2023-11-29 ENCOUNTER — Encounter: Payer: Self-pay | Admitting: Licensed Clinical Social Worker

## 2023-11-29 ENCOUNTER — Other Ambulatory Visit: Payer: Self-pay

## 2023-11-29 ENCOUNTER — Encounter: Payer: Self-pay | Admitting: *Deleted

## 2023-11-29 DIAGNOSIS — Z171 Estrogen receptor negative status [ER-]: Secondary | ICD-10-CM

## 2023-11-29 DIAGNOSIS — Z1589 Genetic susceptibility to other disease: Secondary | ICD-10-CM | POA: Insufficient documentation

## 2023-11-29 DIAGNOSIS — Z1379 Encounter for other screening for genetic and chromosomal anomalies: Secondary | ICD-10-CM | POA: Insufficient documentation

## 2023-11-29 NOTE — Telephone Encounter (Signed)
Call placed to patient and patient made aware.   Letter transcribed.

## 2023-12-01 NOTE — Progress Notes (Signed)
 [facility] [address] [place] [address]   Clinic Day:  12/02/2023  Referring physician: [name], MD  Patient Care Team: Meredith Rollene BRAVO, MD as PCP - General   ASSESSMENT & PLAN:   Assessment:  1.  T1 cN0 G3 ER/PR/HER2-left breast IDC: - Screening mammogram on 10/20/2022: Abnormal - Left breast diagnostic mammogram/ultrasound (10/26/2023): Linear oriented pleomorphic calcifications in the lower inner left breast span 5.9 cm.  There has been interval development of irregular mass associated with these calcifications in the lower inner left breast.  Ultrasound of the left breast shows irregular hypoechoic mass in the left breast at 9 o'clock position measuring 2 x 1.3 x 1.6 cm.  No abnormal lymph nodes in the left axilla. - Left breast 9:00 biopsy (11/02/2023): Poorly differentiated IDC, grade 3, ER/PR negative, HER2 (1+) by IHC.  Ki-67: 20%. - Left breast biopsy LIQ posterior extent and anterior extent (11/17/2023): DCIS, comedo, high nuclear grade. - PET scan (11/25/2023): No evidence of metastatic disease.  2. Social/Family History: -Lives at home with her daughter. Works full-time with mild physical activity at work. No tobacco use.  -Brother died of lung cancer in his early 96's with a history of tobacco use. No other family history of cancer.  Plan:  1.  T1 cN0 G3 triple negative left breast IDC: - We reviewed biopsy results of the LIQ left breast which showed DCIS. - She met with Dr. Kallie and is planning to undergo left mastectomy and SLNB. - We reviewed PET scan from 11/25/2023 which did not show any metastatic disease. - Will see her back 4 weeks after surgery to discuss adjuvant chemotherapy.  Will most likely need AC followed by Taxol .  She will also have a port placed at the same time of surgery.  2.  Peripheral neuropathy: - She has tingling/numbness in the toes for the last 5 years, worse at nighttime.  No neuropathic  pains.  3.  High risk drug monitoring: - We reviewed echocardiogram from 11/23/2023: LVEF 55%.  Small pericardial effusion.  4.BRIP1 heterozygosity: - We reviewed genetic testing results which was positive for BRIP1 mutation, putting her at high risk for ovarian cancer.  Also at high risk for Fanconi anemia. - She had unilateral salpingo-oophorectomy more than 10 years ago.  I have recommended salpingo-oophorectomy to decrease her ovarian cancer risk.  We will readdress it after her breast surgery. - I have also recommended that her daughters be tested for the same mutation.   No orders of the defined types were placed in this encounter.     Meredith Sanchez,acting as a neurosurgeon for Meredith Stands, MD.,have documented all relevant documentation on the behalf of Meredith Stands, MD,as directed by  Meredith Stands, MD while in the presence of Meredith Stands, MD.  I, Meredith Stands MD, have reviewed the above documentation for accuracy and completeness, and I agree with the above.    Meredith Stands, MD   1/2/20253:13 PM  CHIEF COMPLAINT/PURPOSE OF CONSULT:   Diagnosis: Triple negative breast cancer of upper-inner quadrant of left female breast   Cancer Staging  Breast cancer of upper-inner quadrant of left female breast [facility]) Staging form: Breast, AJCC 8th Edition - Clinical stage from 11/15/2023: Stage IB (cT1c, cN0, cM0, G3, ER-, PR-, HER2-) - Unsigned    Prior Therapy: none  Current Therapy: Under workup   HISTORY OF PRESENT ILLNESS:   Oncology History  Breast cancer of upper-inner quadrant of left female breast (HCC)  11/15/2023 Initial Diagnosis   Breast cancer of upper-inner quadrant of left female breast [facility])    Genetic Testing   Likely pathogenic variant in BRIP1 called c.3196del (p.Ser1066Hisfs*12) identified on the [facility] Common Hereditary Cancers+RNA panel. VUS in CTNNA1 called c.1070G>A (p.Arg357His) identified. The report date is  11/29/2023.  The Common Hereditary Cancers Panel + RNA offered by [facility] includes sequencing and/or deletion duplication testing of the following 48 genes: APC*, ATM*, AXIN2, BAP1, BARD1, BMPR1A, BRCA1, BRCA2, BRIP1, CDH1, CDK4, CDKN2A (p14ARF), CDKN2A (p16INK4a), CHEK2, CTNNA1, DICER1*, EPCAM*, FH*, GREM1*, HOXB13, KIT, MBD4, MEN1*, MLH1*, MSH2*, MSH3*, MSH6*, MUTYH, NF1*, NTHL1, PALB2, PDGFRA, PMS2*, POLD1*, POLE, PTEN*, RAD51C, RAD51D, SDHA*, SDHB, SDHC*, SDHD, SMAD4, SMARCA4, STK11, TP53, TSC1*, TSC2, VHL.        [name] is a 58 y.o. female presenting to clinic today for evaluation of left breast cancer at the request of Meredith Rollene BRAVO, MD.  Patient underwent a US  of the left breast and diagnostic bilateral MM on 10/26/23 after her last visit with her PCP that found: a highly suspicious 2 cm mass in the left breast at the 9 o'clock position with associated linear oriented calcifications, the mass with calcifications span 5.9 cm. She then had a left breast biopsy on 11/02/23 with pathology revealing: invasive poorly differentiated ductal adenocarcinoma, grade 3 with extensive necrosis and the tumor measuring 6.5 mm in greatest linear extent. The biopsy was negative for angiolymphatic invasion and microcalcifications. The tumor cells are negative for HER2 (1+), ER (0%), and PR (0%).   Today, she states that she is doing well overall. Her appetite level is at 100%. Her energy level is at 85%. She is accompanied by her sister.   She has an appointment with Dr. Kallie this afternoon and has 2 additional left breast biopsies scheduled tomorrow. Prior to MM she had not felt any abnormalities on the breasts. She does have a history of right breast biopsy that was benign in 2018. She had menarche at age 31 and a total hysterectomy with appendectomy at 66 for fibroids in uterus. She denies any hot flashes or other menopausal symptoms. She has 3 daughters with her first childbirth at 28. She has used  birth control for less than a year and never taken hormone replacements. She denies any new pains in the last few months. She does note tingling and numbness in the bilateral toes at night for the past 5 years after an MI in 2019. She does not have diabetes. She reports intermittent ankle swellings that she treats with compression socks.   INTERVAL HISTORY:   Meredith Sanchez is a 58 y.o. female presenting to the clinic today for follow-up of Triple negative breast cancer of upper-inner quadrant of left female breast. She was last seen by me on 11/16/23 in consultation.  Since her last visit, she underwent initial PET on 11/25/23. She had a second biopsy of the posterior extent of the lower inner quadrant of the left breast that revealed: in situ ductal carcinoma, comedo, high nuclear grade with necrosis and calcifications present  and DCIS length of 0.3 cm. The anterior extent of the lower inner quadrant of the left breast revealed: in situ ductal carcinoma, comedo and cribriform, high nuclear grade with necrosis and calcifications present and DCIS length of 0.6 cm. Of note, patient has a left mastectomy with sentinel node biopsy scheduled for 12/08/23 with Dr. Kallie.   Today, she states that she is doing well overall. Her appetite level is at 100%. Her energy  level is at 70%. She would like to receive FMLA papers when she starts chemotherapy. She does not wish to have her second ovary removed. She had USO with an appendectomy over 10 years ago.   PAST MEDICAL HISTORY:   Past Medical History: Past Medical History:  Diagnosis Date   Allergic rhinitis    Hyperlipidemia    Hypertension    Myocardial infarction [facility])    Obesity     Surgical History: Past Surgical History:  Procedure Laterality Date   ABDOMINAL HYSTERECTOMY     APPENDECTOMY  11/30/2001   BREAST BIOPSY Left 11/02/2023   path pending   BREAST BIOPSY Left 11/02/2023   US  LT BREAST BX W LOC DEV 1ST LESION IMG BX SPEC US  GUIDE  11/02/2023 [facility]-ULTRASOUND   BREAST BIOPSY Left 11/17/2023   MM LT BREAST BX W LOC DEV 1ST LESION IMAGE BX SPEC STEREO GUIDE 11/17/2023 [facility] MAMMOGRAPHY   BREAST BIOPSY Left 11/17/2023   MM LT BREAST BX W LOC DEV EA AD LESION IMG BX SPEC STEREO GUIDE 11/17/2023 [facility]MO[facility]   COLONOSCOPY N/A 08/20/2014   Procedure: COLONOSCOPY;  Surgeon: Margo LITTIE Haddock, MD;  Location: [facility] SUITE;  Service: Endoscopy;  Laterality: N/A;  2:15pm   LEFT OOPHORECTOMY  11/30/2001   VESICOVAGINAL FISTULA CLOSURE W/ TAH  11/30/2001    Social History: Social History   Socioeconomic History   Marital status: Married    Spouse name: Not on file   Number of children: 3   Years of education: Not on file   Highest education level: Not on file  Occupational History   Occupation: employed     Comment: [employer]  Tobacco Use   Smoking status: Never   Smokeless tobacco: Never  Vaping Use   Vaping status: Never Used  Substance and Sexual Activity   Alcohol use: No   Drug use: No   Sexual activity: Not on file  Other Topics Concern   Not on file  Social History Narrative   Not on file   Social Drivers of Health   Financial Resource Strain: Not on file  Food Insecurity: Not on file  Transportation Needs: Not on file  Physical Activity: Inactive (12/08/2018)   Received from [facility], [facility]   Exercise Vital Sign    Days of Exercise per Week: 0 days    Minutes of Exercise per Session: 0 min  Stress: Not on file  Social Connections: Not on file  Intimate Partner Violence: Not At Risk (12/08/2018)   Received from [facility], [facility]   Humiliation, Afraid, Rape, and Kick questionnaire    Fear of Current or Ex-Partner: No    Emotionally Abused: No    Physically Abused: No    Sexually Abused: No    Family History: Family History  Problem Relation Age of Onset   Diabetes Mother    Hypertension Mother    Hypertension Father    Brain cancer Brother 72       metastatic  lung cancer to brain   Colon cancer Neg Hx     Current Medications:  Current Outpatient Medications:    amlodipine -olmesartan  (AZOR ) 10-20 MG tablet, TAKE 1 TABLET BY MOUTH DAILY, Disp: 90 tablet, Rfl: 3   ASPIRIN  81 PO, Take 1 tablet by mouth daily., Disp: , Rfl:    atorvastatin  (LIPITOR) 40 MG tablet, TAKE 1 TABLET(40 MG) BY MOUTH DAILY, Disp: 90 tablet, Rfl: 3   gabapentin  (NEURONTIN ) 100 MG capsule, Take  1 capsule (100 mg total) by mouth at bedtime., Disp: 90 capsule, Rfl: 1   Multiple Vitamin (MULTIVITAMIN) tablet, Take 1 tablet by mouth daily., Disp: , Rfl:    triamterene -hydrochlorothiazide  (MAXZIDE -25) 37.5-25 MG tablet, TAKE 1 TABLET BY MOUTH DAILY, Disp: 90 tablet, Rfl: 3   Allergies: Allergies  Allergen Reactions   Ace Inhibitors Shortness Of Breath    Roof of mouth  Tingling, throat closing   Penicillins     Too strong  Too strong    Poison Ivy Extract Rash    rash    REVIEW OF SYSTEMS:   Review of Systems  Constitutional:  Negative for chills, fatigue and fever.  HENT:   Negative for lump/mass, mouth sores, nosebleeds, sore throat and trouble swallowing.   Eyes:  Negative for eye problems.  Respiratory:  Negative for cough and shortness of breath.   Cardiovascular:  Negative for chest pain, leg swelling and palpitations.  Gastrointestinal:  Negative for abdominal pain, constipation, diarrhea, nausea and vomiting.  Genitourinary:  Negative for bladder incontinence, difficulty urinating, dysuria, frequency, hematuria and nocturia.   Musculoskeletal:  Negative for arthralgias, back pain, flank pain, myalgias and neck pain.  Skin:  Negative for itching and rash.  Neurological:  Negative for dizziness, headaches and numbness.  Hematological:  Does not bruise/bleed easily.  Psychiatric/Behavioral:  Negative for depression, sleep disturbance and suicidal ideas. The patient is nervous/anxious.   All other systems reviewed and are negative.    VITALS:   There were no  vitals taken for this visit.  Wt Readings from Last 3 Encounters:  11/16/23 183 lb (83 kg)  11/16/23 184 lb 3.2 oz (83.6 kg)  09/10/23 189 lb 1.3 oz (85.8 kg)    There is no height or weight on file to calculate BMI.  Performance status (ECOG): 1 - Symptomatic but completely ambulatory  PHYSICAL EXAM:   Physical Exam Vitals and nursing note reviewed. Exam conducted with a chaperone present.  Constitutional:      Appearance: Normal appearance.  Cardiovascular:     Rate and Rhythm: Normal rate and regular rhythm.     Pulses: Normal pulses.     Heart sounds: Normal heart sounds.  Pulmonary:     Effort: Pulmonary effort is normal.     Breath sounds: Normal breath sounds.  Abdominal:     Palpations: Abdomen is soft. There is no hepatomegaly, splenomegaly or mass.     Tenderness: There is no abdominal tenderness.  Musculoskeletal:     Right lower leg: No edema.     Left lower leg: No edema.  Lymphadenopathy:     Cervical: No cervical adenopathy.     Right cervical: No superficial, deep or posterior cervical adenopathy.    Left cervical: No superficial, deep or posterior cervical adenopathy.     Upper Body:     Right upper body: No supraclavicular or axillary adenopathy.     Left upper body: No supraclavicular or axillary adenopathy.  Neurological:     General: No focal deficit present.     Mental Status: She is alert and oriented to person, place, and time.  Psychiatric:        Mood and Affect: Mood normal.        Behavior: Behavior normal.   Breast Exam Chaperone: Joesph Bohr, RN   LABS:      Latest Ref Rng & Units 11/16/2023    8:56 AM 04/09/2023    2:17 PM 01/28/2022    1:52 PM  CBC  WBC 4.0 - 10.5 K/uL 7.9  4.9  5.3   Hemoglobin 12.0 - 15.0 g/dL 86.4  87.2  86.4   Hematocrit 36.0 - 46.0 % 40.6  36.4  38.4   Platelets 150 - 400 K/uL 355  359  361       Latest Ref Rng & Units 11/16/2023    8:56 AM 09/10/2023    1:43 PM 04/09/2023    2:17 PM  CMP  Glucose 70  - 99 mg/dL 880  897  894   BUN 6 - 20 mg/dL 27  27  25    Creatinine 0.44 - 1.00 mg/dL 8.77  8.84  8.99   Sodium 135 - 145 mmol/L 137  140  139   Potassium 3.5 - 5.1 mmol/L 3.1  4.0  4.3   Chloride 98 - 111 mmol/L 100  104  101   CO2 22 - 32 mmol/L 25  19  22    Calcium  8.9 - 10.3 mg/dL 89.7  9.7  89.7   Total Protein 6.5 - 8.1 g/dL 8.2  7.2  7.3   Total Bilirubin <1.2 mg/dL 0.8  0.5  0.7   Alkaline Phos 38 - 126 U/L 52  67  61   AST 15 - 41 U/L 25  34  20   ALT 0 - 44 U/L 28  34  20      No results found for: CEA1, CEA / No results found for: CEA1, CEA No results found for: PSA1 No results found for: CAN199 No results found for: CAN125  No results found for: TOTALPROTELP, ALBUMINELP, A1GS, A2GS, BETS, BETA2SER, GAMS, MSPIKE, SPEI Lab Results  Component Value Date   TIBC 344 08/17/2014   FERRITIN 348 (H) 08/17/2014   FERRITIN 134 07/09/2014   IRONPCTSAT 28 08/17/2014   No results found for: LDH   STUDIES:   NM PET Image Initial (PI) Skull Base To Thigh Result Date: 12/02/2023 CLINICAL DATA:  Initial treatment strategy for breast carcinoma. EXAM: NUCLEAR MEDICINE PET SKULL BASE TO THIGH TECHNIQUE: 9.4 mCi F-18 FDG was injected intravenously. Full-ring PET imaging was performed from the skull base to thigh after the radiotracer. CT data was obtained and used for attenuation correction and anatomic localization. Fasting blood glucose: 129 mg/dl COMPARISON:  None Available. FINDINGS: NECK: No hypermetabolic lymph nodes in the neck. Incidental CT findings: None. CHEST: Mass within the medial aspect of the LEFT breast measuring 13 mm has intense metabolic activity with SUV max equal 7.2 (image 71). No hypermetabolic or enlarged LEFT axillary lymph nodes. No hypermetabolic enlarged internal mammary nodes. No mediastinal adenopathy. No suspicious pulmonary nodules. Incidental CT findings: None. ABDOMEN/PELVIS: No abnormal hypermetabolic activity within the  liver, pancreas, adrenal glands, or spleen. No hypermetabolic lymph nodes in the abdomen or pelvis. Incidental CT findings: Multiple diverticula of the descending colon and sigmoid colon without acute inflammation. SKELETON: No focal hypermetabolic activity to suggest skeletal metastasis. Incidental CT findings: None. IMPRESSION: 1. Hypermetabolic mass in the medial LEFT breast consistent with primary breast carcinoma. 2. No evidence metastatic disease by FDG PET CT imaging. Electronically Signed   By: Jackquline Boxer M.D.   On: 12/02/2023 10:26   ECHOCARDIOGRAM COMPLETE Result Date: 11/23/2023    ECHOCARDIOGRAM REPORT   Patient Name:   GINEVRA TACKER Date of Exam: 11/23/2023 Medical Rec #:  [id number]        Height:       66.0 in Accession #:    [id number]  Weight:       183.0 lb Date of Birth:  Apr 23, 1966         BSA:          1.926 m Patient Age:    57 years         BP:           130/82 mmHg Patient Gender: F                HR:           50 bpm. Exam Location:  [facility] Procedure: 2D Echo, 3D Echo, Cardiac Doppler and Color Doppler Indications:    Chemo  History:        Patient has no prior history of Echocardiogram examinations.                 Risk Factors:Hypertension and Dyslipidemia.  Sonographer:    Juanita Shaw Referring Phys: [id number] Layne Lebon IMPRESSIONS  1. Left ventricular ejection fraction, by estimation, is approximately 55%. The left ventricle has normal function. Left ventricular diastolic parameters are consistent with Grade I diastolic dysfunction (impaired relaxation).  2. Right ventricular systolic function is low normal. The right ventricular size is mildly enlarged. Tricuspid regurgitation signal is inadequate for assessing PA pressure.  3. A small pericardial effusion is present. The pericardial effusion is circumferential. There is no evidence of cardiac tamponade.  4. The mitral valve is grossly normal. Mild mitral valve regurgitation.  5. The aortic valve is  tricuspid. Aortic valve regurgitation is not visualized. No aortic stenosis is present. Aortic valve mean gradient measures 3.0 mmHg.  6. The inferior vena cava is normal in size with greater than 50% respiratory variability, suggesting right atrial pressure of 3 mmHg. Comparison(s): No prior Echocardiogram. FINDINGS  Left Ventricle: Left ventricular ejection fraction, by estimation, is 55%. The left ventricle has normal function. The left ventricle has no regional wall motion abnormalities. The left ventricular internal cavity size was normal in size. There is no left ventricular hypertrophy. Left ventricular diastolic parameters are consistent with Grade I diastolic dysfunction (impaired relaxation). Right Ventricle: The right ventricular size is mildly enlarged. No increase in right ventricular wall thickness. Right ventricular systolic function is low normal. Tricuspid regurgitation signal is inadequate for assessing PA pressure. Left Atrium: Left atrial size was normal in size. Right Atrium: Right atrial size was normal in size. Pericardium: A small pericardial effusion is present. The pericardial effusion is circumferential. There is no evidence of cardiac tamponade. Mitral Valve: The mitral valve is grossly normal. Mild mitral valve regurgitation. MV peak gradient, 3.4 mmHg. The mean mitral valve gradient is 1.0 mmHg. Tricuspid Valve: The tricuspid valve is grossly normal. Tricuspid valve regurgitation is mild. Aortic Valve: The aortic valve is tricuspid. Aortic valve regurgitation is not visualized. No aortic stenosis is present. Aortic valve mean gradient measures 3.0 mmHg. Aortic valve peak gradient measures 4.2 mmHg. Aortic valve area, by VTI measures 2.29 cm. Pulmonic Valve: The pulmonic valve was grossly normal. Pulmonic valve regurgitation is trivial. Aorta: The aortic root and ascending aorta are structurally normal, with no evidence of dilitation. Venous: The inferior vena cava is normal in size  with greater than 50% respiratory variability, suggesting right atrial pressure of 3 mmHg. IAS/Shunts: No atrial level shunt detected by color flow Doppler.  LEFT VENTRICLE PLAX 2D LVIDd:         4.20 cm      Diastology LVIDs:         3.00 cm  LV e' medial:    9.14 cm/s LV PW:         0.90 cm      LV E/e' medial:  6.3 LV IVS:        0.90 cm      LV e' lateral:   9.48 cm/s LVOT diam:     2.10 cm      LV E/e' lateral: 6.1 LV SV:         50 LV SV Index:   26 LVOT Area:     3.46 cm  LV Volumes (MOD) LV vol d, MOD A2C: 133.0 ml LV vol d, MOD A4C: 117.0 ml LV vol s, MOD A2C: 49.9 ml LV vol s, MOD A4C: 56.9 ml LV SV MOD A2C:     83.1 ml LV SV MOD A4C:     117.0 ml LV SV MOD BP:      75.2 ml RIGHT VENTRICLE             IVC RV Basal diam:  2.80 cm     IVC diam: 1.60 cm RV S prime:     13.20 cm/s TAPSE (M-mode): 2.2 cm LEFT ATRIUM             Index        RIGHT ATRIUM          Index LA diam:        3.30 cm 1.71 cm/m   RA Area:     9.52 cm LA Vol (A2C):   25.5 ml 13.24 ml/m  RA Volume:   14.20 ml 7.37 ml/m LA Vol (A4C):   33.3 ml 17.29 ml/m LA Biplane Vol: 30.5 ml 15.84 ml/m  AORTIC VALVE                    PULMONIC VALVE AV Area (Vmax):    2.17 cm     PV Vmax:       0.87 m/s AV Area (Vmean):   1.70 cm     PV Peak grad:  3.0 mmHg AV Area (VTI):     2.29 cm AV Vmax:           102.00 cm/s AV Vmean:          77.800 cm/s AV VTI:            0.216 m AV Peak Grad:      4.2 mmHg AV Mean Grad:      3.0 mmHg LVOT Vmax:         64.00 cm/s LVOT Vmean:        38.100 cm/s LVOT VTI:          0.143 m LVOT/AV VTI ratio: 0.66  AORTA Ao Root diam: 3.20 cm Ao Asc diam:  2.60 cm MITRAL VALVE MV Area (PHT): 2.36 cm    SHUNTS MV Area VTI:   1.93 cm    Systemic VTI:  0.14 m MV Peak grad:  3.4 mmHg    Systemic Diam: 2.10 cm MV Mean grad:  1.0 mmHg MV Vmax:       0.92 m/s MV Vmean:      44.6 cm/s MV Decel Time: 321 msec MV E velocity: 57.80 cm/s MV A velocity: 71.60 cm/s MV E/A ratio:  0.81 Jayson Sierras MD Electronically signed by  Jayson Sierras MD Signature Date/Time: 11/23/2023/10:11:53 AM    Final    MM LT BREAST BX W LOC DEV 1ST LESION IMAGE BX SPEC STEREO GUIDE Addendum Date: 11/18/2023  ADDENDUM REPORT: 11/18/2023 13:09 ADDENDUM: Pathology revealed DUCTAL CARCINOMA IN SITU, COMEDO, HIGH NUCLEAR GRADE, NECROSIS: PRESENT, CALCIFICATIONS: PRESENT of the LEFT breast, lower inner quadrant, posterior extent, (x clip). This was found to be concordant by Dr. Alm Parkins. Pathology revealed DUCTAL CARCINOMA IN SITU, COMEDO AND CRIBRIFORM, HIGH NUCLEAR GRADE, NECROSIS: PRESENT, CALCIFICATIONS: PRESENT of the LEFT breast, lower inner quadrant, anterior extent, (coil clip). This was found to be concordant by Dr. Alm Parkins. Pathology results were discussed with the patient by telephone. The patient reported doing well after the biopsies with tenderness at the sites. Post biopsy instructions and care were reviewed and questions were answered. The patient was encouraged to call [name] for any additional concerns. My direct phone number was provided. The patient has a recent diagnosis of LEFT breast cancer and should follow her outlined treatment plan. Dr. Manuelita [name] with [facility] was notified of biopsy results via secure EPIC message on November 18, 2023. Pathology results reported by Hendricks Benders, RN on 11/18/2023. Electronically Signed   By: Alm Parkins M.D.   On: 11/18/2023 13:09   Result Date: 11/18/2023 CLINICAL DATA:  Patient presents for stereotactic core needle biopsy of the anterior-posterior extents of a 5.9 cm span of left breast calcifications, which lie anterior and posterior to a biopsy carcinoma, which was biopsied under ultrasound guidance on 11/02/2023. EXAM: LEFT BREAST STEREOTACTIC CORE NEEDLE BIOPSY: 2 STEREOTACTIC BIOPSIES PERFORMED. COMPARISON:  Previous exam(s). FINDINGS: The patient and I discussed the procedure of stereotactic-guided biopsy  including benefits and alternatives. We discussed the high likelihood of a successful procedure. We discussed the risks of the procedure including infection, bleeding, tissue injury, clip migration, and inadequate sampling. Informed written consent was given. The usual time out protocol was performed immediately prior to the procedure. Biopsy #1: Posterior extent of calcifications in the lower inner left breast. Using sterile technique and 1% Lidocaine  as local anesthetic, under stereotactic guidance, a 9 gauge vacuum assisted device was used to perform core needle biopsy of calcifications in the lower inner quadrant of the left breast, posterior to the recently biopsied carcinoma, using a medial approach. Specimen radiograph was performed showing calcifications for which biopsy was performed. Specimens with calcifications are identified for pathology. Lesion quadrant: Lower inner quadrant At the conclusion of the procedure, an X shaped tissue marker clip was deployed into the biopsy cavity. Biopsy #2: Anterior extent of calcifications in the lower inner left breast. Using sterile technique and 1% Lidocaine  as local anesthetic, under stereotactic guidance, a 9 gauge vacuum assisted device was used to perform core needle biopsy of calcifications in the lower inner quadrant of the left breast, just anterior to the recently biopsied carcinoma, using a medial approach. Specimen radiograph was performed showing calcifications for which biopsy was performed. Specimens with calcifications are identified for pathology. Lesion quadrant: Lower inner quadrant At the conclusion of the procedure, a coil shaped tissue marker clip was deployed into the biopsy cavity. Follow-up 2-view mammogram was performed and dictated separately. IMPRESSION: Stereotactic-guided biopsy of the anterior and posterior extents of calcifications in the lower inner left breast. No apparent complications. Electronically Signed: By: Alm Parkins M.D.  On: 11/17/2023 11:19   MM LT BREAST BX W LOC DEV EA AD LESION IMG BX SPEC STEREO GUIDE Addendum Date: 11/18/2023 ADDENDUM REPORT: 11/18/2023 13:09 ADDENDUM: Pathology revealed DUCTAL CARCINOMA IN SITU, COMEDO, HIGH NUCLEAR GRADE, NECROSIS: PRESENT, CALCIFICATIONS: PRESENT of the LEFT breast, lower inner quadrant, posterior extent, (x  clip). This was found to be concordant by Dr. Alm Parkins. Pathology revealed DUCTAL CARCINOMA IN SITU, COMEDO AND CRIBRIFORM, HIGH NUCLEAR GRADE, NECROSIS: PRESENT, CALCIFICATIONS: PRESENT of the LEFT breast, lower inner quadrant, anterior extent, (coil clip). This was found to be concordant by Dr. Alm Parkins. Pathology results were discussed with the patient by telephone. The patient reported doing well after the biopsies with tenderness at the sites. Post biopsy instructions and care were reviewed and questions were answered. The patient was encouraged to call [facility] for any additional concerns. My direct phone number was provided. The patient has a recent diagnosis of LEFT breast cancer and should follow her outlined treatment plan. Dr. Manuelita [name] with [facility] was notified of biopsy results via secure EPIC message on November 18, 2023. Pathology results reported by Hendricks Benders, RN on 11/18/2023. Electronically Signed   By: Alm Parkins M.D.   On: 11/18/2023 13:09   Result Date: 11/18/2023 CLINICAL DATA:  Patient presents for stereotactic core needle biopsy of the anterior-posterior extents of a 5.9 cm span of left breast calcifications, which lie anterior and posterior to a biopsy carcinoma, which was biopsied under ultrasound guidance on 11/02/2023. EXAM: LEFT BREAST STEREOTACTIC CORE NEEDLE BIOPSY: 2 STEREOTACTIC BIOPSIES PERFORMED. COMPARISON:  Previous exam(s). FINDINGS: The patient and I discussed the procedure of stereotactic-guided biopsy including benefits and alternatives. We discussed the  high likelihood of a successful procedure. We discussed the risks of the procedure including infection, bleeding, tissue injury, clip migration, and inadequate sampling. Informed written consent was given. The usual time out protocol was performed immediately prior to the procedure. Biopsy #1: Posterior extent of calcifications in the lower inner left breast. Using sterile technique and 1% Lidocaine  as local anesthetic, under stereotactic guidance, a 9 gauge vacuum assisted device was used to perform core needle biopsy of calcifications in the lower inner quadrant of the left breast, posterior to the recently biopsied carcinoma, using a medial approach. Specimen radiograph was performed showing calcifications for which biopsy was performed. Specimens with calcifications are identified for pathology. Lesion quadrant: Lower inner quadrant At the conclusion of the procedure, an X shaped tissue marker clip was deployed into the biopsy cavity. Biopsy #2: Anterior extent of calcifications in the lower inner left breast. Using sterile technique and 1% Lidocaine  as local anesthetic, under stereotactic guidance, a 9 gauge vacuum assisted device was used to perform core needle biopsy of calcifications in the lower inner quadrant of the left breast, just anterior to the recently biopsied carcinoma, using a medial approach. Specimen radiograph was performed showing calcifications for which biopsy was performed. Specimens with calcifications are identified for pathology. Lesion quadrant: Lower inner quadrant At the conclusion of the procedure, a coil shaped tissue marker clip was deployed into the biopsy cavity. Follow-up 2-view mammogram was performed and dictated separately. IMPRESSION: Stereotactic-guided biopsy of the anterior and posterior extents of calcifications in the lower inner left breast. No apparent complications. Electronically Signed: By: Alm Parkins M.D. On: 11/17/2023 11:19   MM CLIP PLACEMENT LEFT Result  Date: 11/17/2023 CLINICAL DATA:  Assessed post biopsy marker clip placement following stereotactic core needle biopsy anterior-posterior extents of left breast calcifications. EXAM: 3D DIAGNOSTIC LEFT MAMMOGRAM POST STEREOTACTIC BIOPSY COMPARISON:  Previous exam(s). FINDINGS: 3D Mammographic images were obtained following stereotactic guided biopsy of left breast calcifications. The X shaped biopsy clip lies adjacent to residual calcifications along the posterior extent of the lower inner quadrant calcifications, posterior to the  recently biopsied carcinoma. The coil shaped biopsy clip lies anterior to the recently biopsied carcinoma adjacent to a few residual calcifications. IMPRESSION: Appropriate positioning of the X and coil shaped biopsy marking clips at the site of biopsy in the lower inner left breast as detailed above. Final Assessment: Post Procedure Mammograms for Marker Placement Electronically Signed   By: Alm Parkins M.D.   On: 11/17/2023 11:22

## 2023-12-02 ENCOUNTER — Ambulatory Visit: Payer: Medicaid Other | Admitting: General Surgery

## 2023-12-02 ENCOUNTER — Inpatient Hospital Stay: Payer: BC Managed Care – PPO | Attending: Hematology | Admitting: Hematology

## 2023-12-02 DIAGNOSIS — Z87891 Personal history of nicotine dependence: Secondary | ICD-10-CM | POA: Diagnosis not present

## 2023-12-02 DIAGNOSIS — C50212 Malignant neoplasm of upper-inner quadrant of left female breast: Secondary | ICD-10-CM | POA: Diagnosis not present

## 2023-12-02 DIAGNOSIS — Z1722 Progesterone receptor negative status: Secondary | ICD-10-CM | POA: Diagnosis not present

## 2023-12-02 DIAGNOSIS — Z1731 Human epidermal growth factor receptor 2 positive status: Secondary | ICD-10-CM | POA: Insufficient documentation

## 2023-12-02 DIAGNOSIS — Z171 Estrogen receptor negative status [ER-]: Secondary | ICD-10-CM | POA: Diagnosis not present

## 2023-12-02 NOTE — Patient Instructions (Addendum)
 [facility] Discharge Instructions   You were seen and examined today by Dr. Rogers.  He reviewed the images from your PET scan which does not show any cancer spread to any other parts of your body.   Proceed with surgery as scheduled.   We will see you back after your surgery.    Thank you for choosing [facility] at [facility] to provide your oncology and hematology care.  To afford each patient quality time with our provider, please arrive at least 15 minutes before your scheduled appointment time.   If you have a lab appointment with the Cancer Center please come in thru the Main Entrance and check in at the main information desk.  You need to re-schedule your appointment should you arrive 10 or more minutes late.  We strive to give you quality time with our providers, and arriving late affects you and other patients whose appointments are after yours.  Also, if you no show three or more times for appointments you may be dismissed from the clinic at the providers discretion.     Again, thank you for choosing [facility].  Our hope is that these requests will decrease the amount of time that you wait before being seen by our physicians.       _____________________________________________________________  Should you have questions after your visit to [facility], please contact our office at [phone] and follow the prompts.  Our office hours are 8:00 a.m. and 4:30 p.m. Monday - Friday.  Please note that voicemails left after 4:00 p.m. may not be returned until the following business day.  We are closed weekends and major holidays.  You do have access to a nurse 24-7, just call the main number to the clinic [phone] and do not press any options, hold on the line and a nurse will answer the phone.    For prescription refill requests, have your pharmacy contact our office and allow 72 hours.     Due to Covid, you will need to wear a mask upon entering the hospital. If you do not have a mask, a mask will be given to you at the Main Entrance upon arrival. For doctor visits, patients may have 1 support person age 85 or older with them. For treatment visits, patients can not have anyone with them due to social distancing guidelines and our immunocompromised population.

## 2023-12-06 ENCOUNTER — Encounter (HOSPITAL_COMMUNITY): Payer: Self-pay

## 2023-12-06 ENCOUNTER — Other Ambulatory Visit: Payer: Self-pay

## 2023-12-06 ENCOUNTER — Encounter (HOSPITAL_COMMUNITY)
Admission: RE | Admit: 2023-12-06 | Discharge: 2023-12-06 | Disposition: A | Discharge status: Home / Self Care | Payer: Medicaid Other | Source: Ambulatory Visit | Attending: General Surgery | Admitting: General Surgery

## 2023-12-06 NOTE — Pre-Procedure Instructions (Signed)
 Attempted pre-op phonecall. Left VM for her to call us back.

## 2023-12-08 ENCOUNTER — Other Ambulatory Visit: Payer: Self-pay

## 2023-12-08 ENCOUNTER — Ambulatory Visit (HOSPITAL_COMMUNITY): Payer: BC Managed Care – PPO

## 2023-12-08 ENCOUNTER — Ambulatory Visit (HOSPITAL_COMMUNITY)
Admission: RE | Admit: 2023-12-08 | Discharge: 2023-12-09 | Disposition: A | Discharge status: Home / Self Care | Payer: BC Managed Care – PPO | Attending: General Surgery | Admitting: General Surgery

## 2023-12-08 ENCOUNTER — Ambulatory Visit (HOSPITAL_COMMUNITY): Payer: BC Managed Care – PPO | Admitting: Anesthesiology

## 2023-12-08 ENCOUNTER — Encounter (HOSPITAL_COMMUNITY)
Admission: RE | Disposition: A | Discharge status: Home / Self Care | Payer: Self-pay | Source: Home / Self Care | Attending: General Surgery

## 2023-12-08 ENCOUNTER — Encounter (HOSPITAL_COMMUNITY): Payer: Self-pay | Admitting: General Surgery

## 2023-12-08 DIAGNOSIS — Z171 Estrogen receptor negative status [ER-]: Secondary | ICD-10-CM | POA: Diagnosis not present

## 2023-12-08 DIAGNOSIS — Z1732 Human epidermal growth factor receptor 2 negative status: Secondary | ICD-10-CM | POA: Insufficient documentation

## 2023-12-08 DIAGNOSIS — C50919 Malignant neoplasm of unspecified site of unspecified female breast: Secondary | ICD-10-CM | POA: Diagnosis present

## 2023-12-08 DIAGNOSIS — Z1722 Progesterone receptor negative status: Secondary | ICD-10-CM | POA: Insufficient documentation

## 2023-12-08 DIAGNOSIS — C50212 Malignant neoplasm of upper-inner quadrant of left female breast: Secondary | ICD-10-CM | POA: Diagnosis not present

## 2023-12-08 DIAGNOSIS — I10 Essential (primary) hypertension: Secondary | ICD-10-CM

## 2023-12-08 DIAGNOSIS — I252 Old myocardial infarction: Secondary | ICD-10-CM | POA: Diagnosis not present

## 2023-12-08 HISTORY — PX: SIMPLE MASTECTOMY WITH AXILLARY SENTINEL NODE BIOPSY: SHX6098

## 2023-12-08 HISTORY — PX: PORTACATH PLACEMENT: SHX2246

## 2023-12-08 SURGERY — SIMPLE MASTECTOMY WITH AXILLARY SENTINEL NODE BIOPSY
Anesthesia: General | Site: Chest | Laterality: Right

## 2023-12-08 MED ORDER — CHLORHEXIDINE GLUCONATE 0.12 % MT SOLN
15.0000 mL | Freq: Once | OROMUCOSAL | Status: AC
  Administered 2023-12-08: 15 mL via OROMUCOSAL
  Filled 2023-12-08: qty 15

## 2023-12-08 MED ORDER — FENTANYL CITRATE PF 50 MCG/ML IJ SOSY
25.0000 ug | PREFILLED_SYRINGE | INTRAMUSCULAR | Status: DC | PRN
  Administered 2023-12-08: 50 ug via INTRAVENOUS
  Filled 2023-12-08: qty 1

## 2023-12-08 MED ORDER — TRIAMTERENE-HCTZ 37.5-25 MG PO TABS
1.0000 | ORAL_TABLET | Freq: Every day | ORAL | Status: DC
  Administered 2023-12-09: 1 via ORAL
  Filled 2023-12-08: qty 1

## 2023-12-08 MED ORDER — IRBESARTAN 150 MG PO TABS
150.0000 mg | ORAL_TABLET | Freq: Every day | ORAL | Status: DC
  Filled 2023-12-08: qty 1

## 2023-12-08 MED ORDER — EPHEDRINE SULFATE-NACL 50-0.9 MG/10ML-% IV SOSY
PREFILLED_SYRINGE | INTRAVENOUS | Status: DC | PRN
  Administered 2023-12-08: 10 mg via INTRAVENOUS

## 2023-12-08 MED ORDER — MIDAZOLAM HCL 2 MG/2ML IJ SOLN
INTRAMUSCULAR | Status: AC
  Filled 2023-12-08: qty 2

## 2023-12-08 MED ORDER — FENTANYL CITRATE (PF) 100 MCG/2ML IJ SOLN
INTRAMUSCULAR | Status: AC
  Filled 2023-12-08: qty 2

## 2023-12-08 MED ORDER — PHENYLEPHRINE 80 MCG/ML (10ML) SYRINGE FOR IV PUSH (FOR BLOOD PRESSURE SUPPORT)
PREFILLED_SYRINGE | INTRAVENOUS | Status: DC | PRN
  Administered 2023-12-08: 80 ug via INTRAVENOUS
  Administered 2023-12-08: 100 ug via INTRAVENOUS
  Administered 2023-12-08: 80 ug via INTRAVENOUS
  Administered 2023-12-08: 100 ug via INTRAVENOUS

## 2023-12-08 MED ORDER — ONDANSETRON 4 MG PO TBDP: 4.0000 mg | ORAL_TABLET | Freq: Four times a day (QID) | ORAL | Status: DC | PRN

## 2023-12-08 MED ORDER — GABAPENTIN 100 MG PO CAPS
100.0000 mg | ORAL_CAPSULE | Freq: Every day | ORAL | Status: DC
  Administered 2023-12-08: 100 mg via ORAL
  Filled 2023-12-08: qty 1

## 2023-12-08 MED ORDER — LIDOCAINE HCL (PF) 2 % IJ SOLN
INTRAMUSCULAR | Status: AC
  Filled 2023-12-08: qty 5

## 2023-12-08 MED ORDER — ACETAMINOPHEN 500 MG PO TABS
1000.0000 mg | ORAL_TABLET | ORAL | Status: AC
  Administered 2023-12-08: 1000 mg via ORAL
  Filled 2023-12-08: qty 2

## 2023-12-08 MED ORDER — ACETAMINOPHEN 500 MG PO TABS
1000.0000 mg | ORAL_TABLET | Freq: Four times a day (QID) | ORAL | Status: DC
  Administered 2023-12-08 – 2023-12-09 (×5): 1000 mg via ORAL
  Filled 2023-12-08 (×4): qty 2

## 2023-12-08 MED ORDER — ORAL CARE MOUTH RINSE: 15.0000 mL | Freq: Once | OROMUCOSAL | Status: AC

## 2023-12-08 MED ORDER — DOCUSATE SODIUM 100 MG PO CAPS
100.0000 mg | ORAL_CAPSULE | Freq: Two times a day (BID) | ORAL | Status: DC
  Administered 2023-12-08 – 2023-12-09 (×2): 100 mg via ORAL
  Filled 2023-12-08 (×2): qty 1

## 2023-12-08 MED ORDER — OXYCODONE HCL 5 MG PO TABS
5.0000 mg | ORAL_TABLET | ORAL | Status: DC | PRN
  Administered 2023-12-08: 5 mg via ORAL
  Administered 2023-12-08: 10 mg via ORAL
  Filled 2023-12-08: qty 2
  Filled 2023-12-08: qty 1

## 2023-12-08 MED ORDER — OXYCODONE HCL 5 MG PO TABS: 5.0000 mg | ORAL_TABLET | Freq: Once | ORAL | Status: DC | PRN

## 2023-12-08 MED ORDER — SIMETHICONE 80 MG PO CHEW: 40.0000 mg | CHEWABLE_TABLET | Freq: Four times a day (QID) | ORAL | Status: DC | PRN

## 2023-12-08 MED ORDER — TRAMADOL HCL 50 MG PO TABS: 50.0000 mg | ORAL_TABLET | Freq: Four times a day (QID) | ORAL | Status: DC | PRN

## 2023-12-08 MED ORDER — ATORVASTATIN CALCIUM 40 MG PO TABS
40.0000 mg | ORAL_TABLET | Freq: Every day | ORAL | Status: DC
  Filled 2023-12-08: qty 1

## 2023-12-08 MED ORDER — MIDAZOLAM HCL 5 MG/5ML IJ SOLN
INTRAMUSCULAR | Status: DC | PRN
  Administered 2023-12-08: 2 mg via INTRAVENOUS

## 2023-12-08 MED ORDER — METOPROLOL TARTRATE 5 MG/5ML IV SOLN: 5.0000 mg | Freq: Four times a day (QID) | INTRAVENOUS | Status: DC | PRN

## 2023-12-08 MED ORDER — ONDANSETRON HCL 4 MG/2ML IJ SOLN
INTRAMUSCULAR | Status: AC
  Filled 2023-12-08: qty 2

## 2023-12-08 MED ORDER — HEPARIN SOD (PORK) LOCK FLUSH 100 UNIT/ML IV SOLN
INTRAVENOUS | Status: AC
  Filled 2023-12-08: qty 5

## 2023-12-08 MED ORDER — PANTOPRAZOLE SODIUM 40 MG IV SOLR
40.0000 mg | Freq: Every day | INTRAVENOUS | Status: DC
  Administered 2023-12-08: 40 mg via INTRAVENOUS
  Filled 2023-12-08: qty 10

## 2023-12-08 MED ORDER — CHLORHEXIDINE GLUCONATE CLOTH 2 % EX PADS: 6.0000 | MEDICATED_PAD | Freq: Once | CUTANEOUS | Status: DC

## 2023-12-08 MED ORDER — METHOCARBAMOL 500 MG PO TABS: 500.0000 mg | ORAL_TABLET | Freq: Four times a day (QID) | ORAL | Status: DC | PRN

## 2023-12-08 MED ORDER — ONDANSETRON HCL 4 MG/2ML IJ SOLN: 4.0000 mg | Freq: Once | INTRAMUSCULAR | Status: DC | PRN

## 2023-12-08 MED ORDER — PHENYLEPHRINE 80 MCG/ML (10ML) SYRINGE FOR IV PUSH (FOR BLOOD PRESSURE SUPPORT)
PREFILLED_SYRINGE | INTRAVENOUS | Status: AC
  Filled 2023-12-08: qty 10

## 2023-12-08 MED ORDER — PROPOFOL 10 MG/ML IV BOLUS
INTRAVENOUS | Status: AC
  Filled 2023-12-08: qty 20

## 2023-12-08 MED ORDER — CHLORHEXIDINE GLUCONATE CLOTH 2 % EX PADS
6.0000 | MEDICATED_PAD | Freq: Once | CUTANEOUS | Status: AC
  Administered 2023-12-08: 6 via TOPICAL

## 2023-12-08 MED ORDER — STERILE WATER FOR INJECTION IJ SOLN
INTRAMUSCULAR | Status: DC | PRN
  Administered 2023-12-08: 500 mL

## 2023-12-08 MED ORDER — FENTANYL CITRATE (PF) 100 MCG/2ML IJ SOLN
INTRAMUSCULAR | Status: DC | PRN
  Administered 2023-12-08: 25 ug via INTRAVENOUS
  Administered 2023-12-08: 50 ug via INTRAVENOUS
  Administered 2023-12-08 (×3): 25 ug via INTRAVENOUS

## 2023-12-08 MED ORDER — AMLODIPINE BESYLATE 5 MG PO TABS
10.0000 mg | ORAL_TABLET | Freq: Every day | ORAL | Status: DC
Start: 2023-12-09 — End: 2023-12-09
  Filled 2023-12-08: qty 2

## 2023-12-08 MED ORDER — ZOLPIDEM TARTRATE 5 MG PO TABS
5.0000 mg | ORAL_TABLET | Freq: Every evening | ORAL | Status: DC | PRN
  Administered 2023-12-08: 5 mg via ORAL
  Filled 2023-12-08: qty 1

## 2023-12-08 MED ORDER — SODIUM CHLORIDE (PF) 0.9 % IJ SOLN
INTRAMUSCULAR | Status: AC
  Filled 2023-12-08: qty 10

## 2023-12-08 MED ORDER — HEPARIN SOD (PORK) LOCK FLUSH 100 UNIT/ML IV SOLN
INTRAVENOUS | Status: DC | PRN
  Administered 2023-12-08: 500 [IU] via INTRAVENOUS

## 2023-12-08 MED ORDER — DEXAMETHASONE SODIUM PHOSPHATE 10 MG/ML IJ SOLN
INTRAMUSCULAR | Status: DC | PRN
  Administered 2023-12-08: 10 mg via INTRAVENOUS

## 2023-12-08 MED ORDER — BUPIVACAINE HCL (PF) 0.5 % IJ SOLN
INTRAMUSCULAR | Status: DC | PRN
  Administered 2023-12-08: 6 mL
  Administered 2023-12-08: 24 mL

## 2023-12-08 MED ORDER — LIDOCAINE HCL (CARDIAC) PF 100 MG/5ML IV SOSY
PREFILLED_SYRINGE | INTRAVENOUS | Status: DC | PRN
  Administered 2023-12-08: 75 mg via INTRATRACHEAL

## 2023-12-08 MED ORDER — ACETAMINOPHEN 500 MG PO TABS
ORAL_TABLET | ORAL | Status: AC
  Filled 2023-12-08: qty 2

## 2023-12-08 MED ORDER — EPHEDRINE 5 MG/ML INJ
INTRAVENOUS | Status: AC
  Filled 2023-12-08: qty 5

## 2023-12-08 MED ORDER — LIDOCAINE HCL (PF) 1 % IJ SOLN
INTRAMUSCULAR | Status: AC
  Filled 2023-12-08: qty 30

## 2023-12-08 MED ORDER — BUPIVACAINE HCL (PF) 0.5 % IJ SOLN
INTRAMUSCULAR | Status: AC
  Filled 2023-12-08: qty 30

## 2023-12-08 MED ORDER — CEFAZOLIN SODIUM-DEXTROSE 2-4 GM/100ML-% IV SOLN
2.0000 g | INTRAVENOUS | Status: AC
  Administered 2023-12-08: 2 g via INTRAVENOUS
  Filled 2023-12-08: qty 100

## 2023-12-08 MED ORDER — DIPHENHYDRAMINE HCL 12.5 MG/5ML PO ELIX: 12.5000 mg | ORAL_SOLUTION | Freq: Four times a day (QID) | ORAL | Status: DC | PRN

## 2023-12-08 MED ORDER — DIPHENHYDRAMINE HCL 50 MG/ML IJ SOLN: 12.5000 mg | Freq: Four times a day (QID) | INTRAMUSCULAR | Status: DC | PRN

## 2023-12-08 MED ORDER — DEXAMETHASONE SODIUM PHOSPHATE 10 MG/ML IJ SOLN
INTRAMUSCULAR | Status: AC
  Filled 2023-12-08: qty 1

## 2023-12-08 MED ORDER — PROPOFOL 10 MG/ML IV BOLUS
INTRAVENOUS | Status: DC | PRN
  Administered 2023-12-08: 160 mg via INTRAVENOUS

## 2023-12-08 MED ORDER — AMLODIPINE-OLMESARTAN 10-20 MG PO TABS: 1.0000 | ORAL_TABLET | Freq: Every day | ORAL | Status: DC

## 2023-12-08 MED ORDER — ONDANSETRON HCL 4 MG/2ML IJ SOLN
4.0000 mg | Freq: Four times a day (QID) | INTRAMUSCULAR | Status: DC | PRN
  Administered 2023-12-08 (×2): 4 mg via INTRAVENOUS
  Filled 2023-12-08: qty 2

## 2023-12-08 MED ORDER — LACTATED RINGERS IV SOLN
INTRAVENOUS | Status: DC
Start: 2023-12-08 — End: 2023-12-08

## 2023-12-08 MED ORDER — MORPHINE SULFATE (PF) 2 MG/ML IV SOLN: 2.0000 mg | INTRAVENOUS | Status: DC | PRN

## 2023-12-08 MED ORDER — OXYCODONE HCL 5 MG/5ML PO SOLN: 5.0000 mg | Freq: Once | ORAL | Status: DC | PRN

## 2023-12-08 SURGICAL SUPPLY — 62 items
APPLICATOR CHLORAPREP 10.5 ORG (MISCELLANEOUS) ×2 IMPLANT
APPLIER CLIP 11 MED OPEN (CLIP) ×2 IMPLANT
BAG DECANTER FOR FLEXI CONT (MISCELLANEOUS) ×2 IMPLANT
BINDER BREAST LRG (GAUZE/BANDAGES/DRESSINGS) IMPLANT
CHLORAPREP W/TINT 26 (MISCELLANEOUS) ×2 IMPLANT
CLIP APPLIE 11 MED OPEN (CLIP) IMPLANT
CLOTH BEACON ORANGE TIMEOUT ST (SAFETY) ×2 IMPLANT
COVER LIGHT HANDLE STERIS (MISCELLANEOUS) ×4 IMPLANT
COVER PROBE W GEL 5X96 (DRAPES) ×2 IMPLANT
DERMABOND ADVANCED .7 DNX12 (GAUZE/BANDAGES/DRESSINGS) ×2 IMPLANT
DRAPE C-ARM FOLDED MOBILE STRL (DRAPES) ×2 IMPLANT
DRAPE HALF SHEET 40X57 (DRAPES) IMPLANT
DRAPE LAPAROTOMY TRNSV 102X78 (DRAPES) IMPLANT
DRAPE UTILITY W/TAPE 26X15 (DRAPES) IMPLANT
ELECT REM PT RETURN 9FT ADLT (ELECTROSURGICAL) ×2 IMPLANT
ELECTRODE REM PT RTRN 9FT ADLT (ELECTROSURGICAL) ×2 IMPLANT
EVACUATOR DRAINAGE 10X20 100CC (DRAIN) ×2 IMPLANT
FORCEPS ALLIS DISP 8 (DISPOSABLE) IMPLANT
FORCEPS BABCOCK DISP 6 (DISPOSABLE) IMPLANT
GAUZE 4X4 16PLY ~~LOC~~+RFID DBL (SPONGE) ×2 IMPLANT
GAUZE SPONGE 4X4 12PLY STRL (GAUZE/BANDAGES/DRESSINGS) ×2 IMPLANT
GLOVE BIO SURGEON STRL SZ 6.5 (GLOVE) ×2 IMPLANT
GLOVE BIOGEL PI IND STRL 6.5 (GLOVE) ×2 IMPLANT
GLOVE BIOGEL PI IND STRL 7.0 (GLOVE) ×4 IMPLANT
GLOVE BIOGEL PI IND STRL 7.5 (GLOVE) IMPLANT
GLOVE ECLIPSE 6.5 STRL STRAW (GLOVE) IMPLANT
GLOVE SURG SS PI 7.0 STRL IVOR (GLOVE) IMPLANT
GOWN STRL REUS W/TWL LRG LVL3 (GOWN DISPOSABLE) ×6 IMPLANT
IV NS 500ML BAXH (IV SOLUTION) ×2 IMPLANT
KIT PORT INFUSION SMART 8FR (Port) IMPLANT
KIT TURNOVER KIT A (KITS) ×2 IMPLANT
MANIFOLD NEPTUNE II (INSTRUMENTS) ×2 IMPLANT
NDL HYPO 18GX1.5 BLUNT FILL (NEEDLE) ×2 IMPLANT
NDL HYPO 21X1.5 SAFETY (NEEDLE) ×2 IMPLANT
NDL HYPO 25X1 1.5 SAFETY (NEEDLE) ×2 IMPLANT
NEEDLE HYPO 18GX1.5 BLUNT FILL (NEEDLE) ×2 IMPLANT
NEEDLE HYPO 21X1.5 SAFETY (NEEDLE) ×2 IMPLANT
NEEDLE HYPO 25X1 1.5 SAFETY (NEEDLE) ×2 IMPLANT
NS IRRIG 1000ML POUR BTL (IV SOLUTION) ×2 IMPLANT
PACK MINOR (CUSTOM PROCEDURE TRAY) ×2 IMPLANT
PAD ABD 5X9 TENDERSORB (GAUZE/BANDAGES/DRESSINGS) ×4 IMPLANT
PAD ARMBOARD 7.5X6 YLW CONV (MISCELLANEOUS) ×2 IMPLANT
POSITIONER HEAD 8X9X4 ADT (SOFTGOODS) ×2 IMPLANT
RETRACTOR HANDHELD DISP 8.5 (DISPOSABLE) IMPLANT
SET BASIN LINEN APH (SET/KITS/TRAYS/PACK) ×2 IMPLANT
SPONGE DRAIN TRACH 4X4 STRL 2S (GAUZE/BANDAGES/DRESSINGS) ×2 IMPLANT
SPONGE INTESTINAL PEANUT (DISPOSABLE) IMPLANT
SPONGE T-LAP 18X18 ~~LOC~~+RFID (SPONGE) ×4 IMPLANT
STAPLER VISISTAT (STAPLE) ×2 IMPLANT
SUT 3-0 BLK 1X30 PSL (SUTURE) ×2 IMPLANT
SUT MNCRL AB 4-0 PS2 18 (SUTURE) ×2 IMPLANT
SUT PROLENE 2 0 SH 30 (SUTURE) ×2 IMPLANT
SUT SILK 2 0 SH (SUTURE) ×2 IMPLANT
SUT SILK 2-0 18XBRD TIE 12 (SUTURE) ×2 IMPLANT
SUT VIC AB 2-0 CT1 TAPERPNT 27 (SUTURE) ×6 IMPLANT
SUT VIC AB 3-0 SH 27X BRD (SUTURE) ×2 IMPLANT
SYR 10ML LL (SYRINGE) ×4 IMPLANT
SYR 30ML LL (SYRINGE) ×2 IMPLANT
SYR BULB IRRIG 60ML STRL (SYRINGE) IMPLANT
SYR CONTROL 10ML LL (SYRINGE) ×2 IMPLANT
WATER STERILE IRR 500ML POUR (IV SOLUTION) IMPLANT
YANKAUER SUCT BULB TIP NO VENT (SUCTIONS) IMPLANT

## 2023-12-08 NOTE — Progress Notes (Signed)
 [facility]  Updated her family. Stay overnight. JP drain. Pain control. PT.  Algis Greenhouse, MD [facility] [address] [place] [place], [place] [address] [phone] (office)

## 2023-12-08 NOTE — Anesthesia Preprocedure Evaluation (Addendum)
 Anesthesia Evaluation  Patient identified by MRN, date of birth, ID band Patient awake    Reviewed: Allergy & Precautions, H&P , NPO status , Patient's Chart, lab work & pertinent test results, reviewed documented beta blocker date and time   Airway Mallampati: II  TM Distance: >3 FB Neck ROM: full    Dental no notable dental hx.    Pulmonary neg pulmonary ROS   Pulmonary exam normal breath sounds clear to auscultation       Cardiovascular Exercise Tolerance: Good hypertension, + Past MI   Rhythm:regular Rate:Normal     Neuro/Psych  Headaches  negative psych ROS   GI/Hepatic negative GI ROS, Neg liver ROS,,,  Endo/Other  negative endocrine ROS    Renal/GU negative Renal ROS  negative genitourinary   Musculoskeletal   Abdominal   Peds  Hematology negative hematology ROS (+)   Anesthesia Other Findings   Reproductive/Obstetrics negative OB ROS                             Anesthesia Physical Anesthesia Plan  ASA: 2  Anesthesia Plan: General and General ETT   Post-op Pain Management:    Induction:   PONV Risk Score and Plan: Ondansetron   Airway Management Planned:   Additional Equipment:   Intra-op Plan:   Post-operative Plan:   Informed Consent: I have reviewed the patients History and Physical, chart, labs and discussed the procedure including the risks, benefits and alternatives for the proposed anesthesia with the patient or authorized representative who has indicated his/her understanding and acceptance.     Dental Advisory Given  Plan Discussed with: CRNA  Anesthesia Plan Comments:        Anesthesia Quick Evaluation

## 2023-12-08 NOTE — Anesthesia Procedure Notes (Signed)
 Procedure Name: LMA Insertion Date/Time: 12/08/2023 7:42 AM  Performed by: Toribio Darice BRAVO, CRNAPre-anesthesia Checklist: Patient identified, Patient being monitored, Emergency Drugs available, Timeout performed and Suction available Patient Re-evaluated:Patient Re-evaluated prior to induction Oxygen Delivery Method: Circle System Utilized Preoxygenation: Pre-oxygenation with 100% oxygen Induction Type: IV induction Ventilation: Mask ventilation without difficulty LMA: LMA inserted LMA Size: 4.0 Number of attempts: 1 Placement Confirmation: positive ETCO2 and breath sounds checked- equal and bilateral

## 2023-12-08 NOTE — Transfer of Care (Signed)
 Immediate Anesthesia Transfer of Care Note  Patient: Meredith Sanchez  Procedure(s) Performed: SIMPLE MASTECTOMY WITH AXILLARY SENTINEL NODE BIOPSY (Left: Breast) INSERTION SUBCLAVIAN PORT-A-CATH (Right: Chest)  Patient Location: PACU  Anesthesia Type:General  Level of Consciousness: awake  Airway & Oxygen Therapy: Patient Spontanous Breathing  Post-op Assessment: Report given to RN  Post vital signs: Reviewed and stable  Last Vitals:  Vitals Value Taken Time  BP 103/68 12/08/23 1035  Temp    Pulse 82 12/08/23 1041  Resp 15 12/08/23 1041  SpO2 92 % 12/08/23 1041  Vitals shown include unfiled device data.  Last Pain:  Vitals:   12/08/23 0704  PainSc: 0-No pain         Complications: No notable events documented.

## 2023-12-08 NOTE — Progress Notes (Signed)
 Monitor displays NSR with trigeminey PVC's. Strip placed on chart. Dr Johnnette Litter notified. No new orders given.

## 2023-12-08 NOTE — Op Note (Signed)
 [facility] Operative Note  12/08/23  Preoperative Diagnosis: Left breast cancer   Postoperative Diagnosis: Same   Procedure(s) Performed: Left total mastectomy, sentinel lymph node biopsy after Magtrace placement, right subclavian port a catheter   Surgeon: Manuelita BROCKS. Kallie, MD   Assistants: No qualified resident was available    Anesthesia: General endotracheal   Anesthesiologist: Kendell Yvonna PARAS, MD    Specimens:  Left breast (suture superior), sentinel lymph nodes X 6   Estimated Blood Loss: 25 cc    Blood Replacement: None    Complications: None   Wound Class: Clean    Operative Indications: Ms. Lekas is a 58 yo who has a left breast cancer that is triple negative and will need post operative chemotherapy. We discussed mastectomy and sentinel lymph node biopsy and risk of bleeding, infection, needing more surgery or treatment, risk of port placement including pneumothorax or vessel injury.   Findings: Palpable mass left inner breast    Procedure: The patient was taken to the operating room and placed supine. General endotracheal anesthesia was induced. Intravenous antibiotics were  administered per protocol.  A JACHO approved timeout was performed. Magtrace was injected subareolar and the breast was massaged for 5 minutes.  The left breast, chest and axilla were prepped and draped in the usual sterile fashion.   The mass was palpable in the left inner breast.  An elliptical incision encompassing the nipple areolar complex was created in the transverse direction. Flaps were raised in the avascular plane between subcutaneous tissue and the breast tissue from the clavicle superiorly, the sternum medially, the anterior rectus sheath inferiorly and past the lateral border of the pectoralis major muscle laterally. Hemostasis was achieved in the flaps.  Next the breast tissue and the underlying pectoralis fascia was excised from the pectoralis major muscle moving  medial to latera. At the lateral border, the breast tissue was swung laterally and a lateral pedicle was identified where the breast gave way to the axilla of the fat. This was incised with cautery.   At this time, the Magtrace probe was used to do the sentinel lymph node biopsy. The probe identified a hot node that was Posas and invivo counts read 906, and ex vivo was 1170.  This node was excised using cautery and sharp dissection, and clipping any vessels or lymphatic channels that I noted.  Additional hot lymph nodes were excised with in vivo counts of 883 and ex vivo of 980. With this second node there was also a smaller node that had an ex vivo count of 332.  The Magtrace probe was then placed back into the axilla and noted again in vivo counts of 563 and noting a lymph node. This was removed and the ex vivo counts were 355.  A forth node was identified and was hot with in vivo counts of 167 and ex vivo of 76.  The last lymph node I identified had in vivo counts of 218 and ex vivo 0.  The background after these 6 lymph nodes were removed still had some uptake of Magtrace, but given the depth in the axilla and sample size of 6, I opted to not do any further dissection given the risk of injury or lymphedema.   From here  the wound was irrigated. I placed JP drain through a stab incision laterally. This was secured with a 3-0 Nylon. The wound was closed with 0 Vicryl suture interrupted. The skin was closed with staples. This area was covered with  gauze and ABD dressing, and covered with a sterile green towel and secured.   The drapes were then removed.  The right chest and neck were then prepped and draped in the standard fashion for the port a catheter placement.   An incision was made below the right clavicle. A subcutaneous pocket was formed. The needles advanced into the right subclavian vein using the Seldinger technique without difficulty. A guidewire was then advanced into the right atrium under  fluoroscopic guidance.  Ectopia was not noted. An introducer and peel-away sheath were placed over the guidewire. The catheter was then inserted through the peel-away sheath and the peel-away sheath was removed.  A spot film was performed to confirm the position. The catheter was then attached to the port and the port placed in subcutaneous pocket. Adequate positioning was confirmed by fluoroscopy. Hemostasis was confirmed, and the port was secured with 2-0 prolene sutures.  Good backflow of blood was noted on aspiration of the port. The port was flushed with heparin  flush. Subcutaneous layer was reapproximated using a 3-0 Vicryl interrupted suture. The skin was closed using a 4-0 Vicryl subcuticular suture. Dermabond was applied.    The green towel was removed and the breast binder was placed.   Final inspection revealed acceptable hemostasis. All counts were correct at the end of the case. The patient was awakened from anesthesia and extubated without complication.  The patient went to the PACU in stable condition. A chest x-ray will be performed at that time.   Manuelita [name], MD [facility] [address] [place], [place] [address] [phone] (office)

## 2023-12-08 NOTE — Interval H&P Note (Signed)
 History and Physical Interval Note:  12/08/2023 7:24 AM  Meredith Sanchez  has presented today for surgery, with the diagnosis of BREAST CANCER, LEFT ER NEGATIVE.  The various methods of treatment have been discussed with the patient and family. After consideration of risks, benefits and other options for treatment, the patient has consented to  Procedure(s): SIMPLE MASTECTOMY WITH AXILLARY SENTINEL NODE BIOPSY (Left) INSERTION PORT-A-CATH (Right) as a surgical intervention.  The patient's history has been reviewed, patient examined, no change in status, stable for surgery.  I have reviewed the patient's chart and labs.  Questions were answered to the patient's satisfaction.    Marked  Manuelita JAYSON [name]

## 2023-12-08 NOTE — Evaluation (Signed)
 Physical Therapy Evaluation Patient Details Name: Meredith Sanchez MRN: [id number] DOB: 12-13-1965 Today's Date: 12/08/2023  History of Present Illness  Meredith Sanchez is a 58 y/o females, s/p  Left total mastectomy, sentinel lymph node biopsy after Magtrace placement, right subclavian port a catheter on 12/08/23, with the diagnosis of BREAST CANCER, LEFT  ER NEGATIVE.   Clinical Impression  Patient instructed in and give written instruction for post op mastectomy exercise with good return demonstrated and understanding acknowledged.  Patient able sit up at bedside, transfer to chair/commode in bathroom and ambulate in room/hallway without loss of balance.  Patient encouraged to ambulate ad lib for length of stay.  Plan:  Patient discharged from physical therapy to care of nursing for ambulation daily as tolerated for length of stay.         If plan is discharge home, recommend the following: Help with stairs or ramp for entrance;Assistance with cooking/housework;A little help with bathing/dressing/bathroom   Can travel by private vehicle        Equipment Recommendations BSC/3in1  Recommendations for Other Services       Functional Status Assessment Patient has had a recent decline in their functional status and demonstrates the ability to make significant improvements in function in a reasonable and predictable amount of time.     Precautions / Restrictions Precautions Precautions: None Restrictions Weight Bearing Restrictions Per Provider Order: No      Mobility  Bed Mobility Overal bed mobility: Independent                  Transfers Overall transfer level: Independent                      Ambulation/Gait Ambulation/Gait assistance: Modified independent (Device/Increase time) Gait Distance (Feet): 100 Feet Assistive device: None Gait Pattern/deviations: WFL(Within Functional Limits) Gait velocity: decreased     General Gait Details: grossly WFL  with slightly labored movement, no loss of balance and good return for ambuating in room, hallways  Stairs            Wheelchair Mobility     Tilt Bed    Modified Rankin (Stroke Patients Only)       Balance Overall balance assessment: Mild deficits observed, not formally tested                                           Pertinent Vitals/Pain Pain Assessment Pain Assessment: 0-10 Pain Score: 5  Pain Location: left chest wall at sight of surgery and drain insertion Pain Descriptors / Indicators: Sore, Discomfort, Grimacing Pain Intervention(s): Limited activity within patient's tolerance, Monitored during session, Repositioned, RN gave pain meds during session    Home Living Family/patient expects to be discharged to:: Private residence Living Arrangements: Children Available Help at Discharge: Family;Available 24 hours/day Type of Home: House Home Access: Stairs to enter Entrance Stairs-Rails: None Entrance Stairs-Number of Steps: 1 Alternate Level Stairs-Number of Steps: 6 + 6 Home Layout: Two level;Laundry or work area in basement;Able to live on main level with bedroom/bathroom;Full bath on main level Home Equipment: None      Prior Function Prior Level of Function : Independent/Modified Independent;Driving             Mobility Comments: Community ambulation without AD, drives, shops ADLs Comments: Independent     Extremity/Trunk Assessment   Upper Extremity Assessment Upper  Extremity Assessment: Overall WFL for tasks assessed    Lower Extremity Assessment Lower Extremity Assessment: Overall WFL for tasks assessed    Cervical / Trunk Assessment Cervical / Trunk Assessment: Normal  Communication   Communication Communication: No apparent difficulties  Cognition Arousal: Alert Behavior During Therapy: WFL for tasks assessed/performed Overall Cognitive Status: Within Functional Limits for tasks assessed                                           General Comments      Exercises     Assessment/Plan    PT Assessment Patient does not need any further PT services  PT Problem List         PT Treatment Interventions      PT Goals (Current goals can be found in the Care Plan section)  Acute Rehab PT Goals Patient Stated Goal: return home with family to assist PT Goal Formulation: With patient Time For Goal Achievement: 12/08/23 Potential to Achieve Goals: Good    Frequency       Co-evaluation               AM-PAC PT 6 Clicks Mobility  Outcome Measure Help needed turning from your back to your side while in a flat bed without using bedrails?: None Help needed moving from lying on your back to sitting on the side of a flat bed without using bedrails?: None Help needed moving to and from a bed to a chair (including a wheelchair)?: None Help needed standing up from a chair using your arms (e.g., wheelchair or bedside chair)?: None Help needed to walk in hospital room?: None Help needed climbing 3-5 steps with a railing? : A Little 6 Click Score: 23    End of Session   Activity Tolerance: Patient tolerated treatment well;Patient limited by fatigue Patient left: in bed;with call bell/phone within reach Nurse Communication: Mobility status PT Visit Diagnosis: Unsteadiness on feet (R26.81);Other abnormalities of gait and mobility (R26.89);Muscle weakness (generalized) (M62.81)    Time: 8559-8490 PT Time Calculation (min) (ACUTE ONLY): 29 min   Charges:   PT Evaluation $PT Eval Moderate Complexity: 1 Mod PT Treatments $Therapeutic Activity: 23-37 mins PT General Charges $$ ACUTE PT VISIT: 1 Visit         4:01 PM, 12/08/23 Lynwood Music, MPT Physical Therapist with [facility] [phone] office [phone] mobile phone

## 2023-12-08 NOTE — Progress Notes (Signed)
 [facility]  CXR without obvious ptx. Port in good position.  Algis Greenhouse, MD [facility] [address] [place], [place] [address] [phone] (office)

## 2023-12-09 ENCOUNTER — Encounter (HOSPITAL_COMMUNITY): Payer: Self-pay | Admitting: General Surgery

## 2023-12-09 DIAGNOSIS — I252 Old myocardial infarction: Secondary | ICD-10-CM | POA: Diagnosis not present

## 2023-12-09 DIAGNOSIS — Z1732 Human epidermal growth factor receptor 2 negative status: Secondary | ICD-10-CM | POA: Diagnosis not present

## 2023-12-09 DIAGNOSIS — Z1722 Progesterone receptor negative status: Secondary | ICD-10-CM | POA: Diagnosis not present

## 2023-12-09 DIAGNOSIS — C50212 Malignant neoplasm of upper-inner quadrant of left female breast: Secondary | ICD-10-CM | POA: Diagnosis not present

## 2023-12-09 DIAGNOSIS — Z171 Estrogen receptor negative status [ER-]: Secondary | ICD-10-CM | POA: Diagnosis not present

## 2023-12-09 DIAGNOSIS — I10 Essential (primary) hypertension: Secondary | ICD-10-CM | POA: Diagnosis not present

## 2023-12-09 LAB — CBC
HCT: 34.5 % — ABNORMAL LOW (ref 36.0–46.0)
Hemoglobin: 11 g/dL — ABNORMAL LOW (ref 12.0–15.0)
MCH: 32 pg (ref 26.0–34.0)
MCHC: 31.9 g/dL (ref 30.0–36.0)
MCV: 100.3 fL — ABNORMAL HIGH (ref 80.0–100.0)
Platelets: 199 10*3/uL (ref 150–400)
RBC: 3.44 MIL/uL — ABNORMAL LOW (ref 3.87–5.11)
RDW: 13.6 % (ref 11.5–15.5)
WBC: 10.8 10*3/uL — ABNORMAL HIGH (ref 4.0–10.5)
nRBC: 0 % (ref 0.0–0.2)

## 2023-12-09 LAB — BASIC METABOLIC PANEL
Anion gap: 11 (ref 5–15)
BUN: 34 mg/dL — ABNORMAL HIGH (ref 6–20)
CO2: 20 mmol/L — ABNORMAL LOW (ref 22–32)
Calcium: 8.8 mg/dL — ABNORMAL LOW (ref 8.9–10.3)
Chloride: 104 mmol/L (ref 98–111)
Creatinine, Ser: 1.22 mg/dL — ABNORMAL HIGH (ref 0.44–1.00)
GFR, Estimated: 51 mL/min — ABNORMAL LOW (ref >60)
Glucose, Bld: 145 mg/dL — ABNORMAL HIGH (ref 70–99)
Potassium: 4 mmol/L (ref 3.5–5.1)
Sodium: 135 mmol/L (ref 135–145)

## 2023-12-09 MED ORDER — ONDANSETRON 4 MG PO TBDP: 4.0000 mg | ORAL_TABLET | Freq: Four times a day (QID) | ORAL | 0 refills | Status: AC | PRN

## 2023-12-09 MED ORDER — OXYCODONE HCL 5 MG PO TABS: 5.0000 mg | ORAL_TABLET | ORAL | 0 refills | Status: DC | PRN

## 2023-12-09 MED ORDER — CHLORHEXIDINE GLUCONATE CLOTH 2 % EX PADS: 6.0000 | MEDICATED_PAD | Freq: Every day | CUTANEOUS | Status: DC

## 2023-12-09 MED ORDER — DOCUSATE SODIUM 100 MG PO CAPS: 100.0000 mg | ORAL_CAPSULE | Freq: Two times a day (BID) | ORAL | 0 refills | Status: AC | PRN

## 2023-12-09 NOTE — TOC Initial Note (Signed)
 Transition of Care [facility]) - Initial/Assessment Note    Patient Details  Name: Meredith Sanchez MRN: [id number] Date of Birth: 11-03-1966  Transition of Care [facility]) CM/SW Contact:    Noreen KATHEE Cleotilde ISRAEL Phone Number: 12/09/2023, 10:53 AM  Clinical Narrative:                 CSW spoke with patient regarding PT recommendation for [facility]. Patient is agreeable to [employer] supplying equipment. Zach from [employer] was contacted and equipment will be delivered to room. Patient does not have any other equipment in the home . Patient daughter will provide transportation. TOC signing off.   Expected Discharge Plan: Home/Self Care Barriers to Discharge: Barriers Resolved   Patient Goals and CMS Choice Patient states their goals for this hospitalization and ongoing recovery are:: return home CMS Medicare.gov Compare Post Acute Care list provided to:: Patient Choice offered to / list presented to : Patient      Expected Discharge Plan and Services In-house Referral: Clinical Social Work   Post Acute Care Choice: Durable Medical Equipment Living arrangements for the past 2 months: Single Family Home                 DME Arranged: Bedside commode DME Agency: [employer] Date DME Agency Contacted: 12/09/23 Time DME Agency Contacted: 1052 Representative spoke with at DME Agency: Darlyn            Prior Living Arrangements/Services Living arrangements for the past 2 months: Single Family Home Lives with:: Adult Children Patient language and need for interpreter reviewed:: Yes        Need for Family Participation in Patient Care: Yes (Comment) Care giver support system in place?: No (comment) (Patient is independent)   Criminal Activity/Legal Involvement Pertinent to Current Situation/Hospitalization: No - Comment as needed  Activities of Daily Living   ADL Screening (condition at time of admission) Independently performs ADLs?: Yes (appropriate for developmental age) Is the patient deaf or  have difficulty hearing?: No Does the patient have difficulty seeing, even when wearing glasses/contacts?: No Does the patient have difficulty concentrating, remembering, or making decisions?: No  Permission Sought/Granted      Share Information with NAME: Geneveive Furness     Permission granted to share info w Relationship: Patient     Emotional Assessment Appearance:: Appears stated age   Affect (typically observed): Appropriate Orientation: : Oriented to Self, Oriented to Place, Oriented to  Time, Oriented to Situation Alcohol / Substance Use: Not Applicable Psych Involvement: No (comment)  Admission diagnosis:  Breast cancer (HCC) [C50.919] Patient Active Problem List   Diagnosis Date Noted   Breast cancer (HCC) 12/08/2023   Genetic testing 11/29/2023   Monoallelic mutation of BRIP1 gene 11/29/2023   Breast cancer of upper-inner quadrant of left female breast (HCC) 11/15/2023   Other hyperlipidemia 09/27/2023   Bunion 09/27/2023   Encounter for immunization 09/27/2023   Encounter for annual physical exam 04/11/2023   Poison ivy dermatitis 09/15/2022   Allergic contact dermatitis 06/26/2022   Rash and nonspecific skin eruption 04/05/2022   Neuropathy 02/07/2021   Obesity (BMI 30.0-34.9) 07/29/2019   Hemorrhoids 07/29/2019   Numbness and tingling of foot 07/29/2019   Hot flashes due to surgical menopause 10/13/2017   Metabolic syndrome X 03/07/2012   Abnormal mammogram of left breast 10/13/2010   Prediabetes 06/09/2010   Migraine headache 09/12/2009   Hyperlipidemia LDL goal <100 03/29/2008   Essential hypertension 03/29/2008   Allergic rhinitis 03/29/2008   PCP:  Antonetta Quant  E, MD Pharmacy:   [facility] [phone] - [address] [place] - [address] AT [facility] [address] [place] [address] Phone: [phone] Fax: [phone]  [facility] [address] [place] - [address] [place] [address] Phone:  [phone] Fax: [phone]  [facility] [place], [place] - [address] [place] [address] Phone: [phone] Fax: [phone]     Social Drivers of Health (SDOH) Social History: SDOH Screenings   Food Insecurity: Patient Declined (12/08/2023)  Housing: Unknown (12/08/2023)  Transportation Needs: Patient Declined (12/08/2023)  Utilities: Patient Declined (12/08/2023)  Depression (PHQ2-9): Low Risk  (09/10/2023)  Physical Activity: Inactive (12/08/2018)   Received from [facility]  Tobacco Use: Low Risk  (12/08/2023)   SDOH Interventions:     Readmission Risk Interventions     No data to display

## 2023-12-09 NOTE — TOC CM/SW Note (Signed)
 The beneficiary will benefit from having a BSC due to being confined to one level of the home environment and there is no toilet on that level .The beneficiary has Unsteadiness on feet (R26.81) and Other abnormalities of gait and mobility (R26.89).

## 2023-12-09 NOTE — Discharge Summary (Signed)
 Physician Discharge Summary  Patient ID: Meredith Sanchez MRN: [id number] DOB/AGE: [birth year] 58 y.o.  Admit date: 12/08/2023 Discharge date: 12/09/2023  Admission Diagnoses: Left breast cancer   Discharge Diagnoses:  Principal Problem:   Breast cancer of upper-inner quadrant of left female breast [facility]) Active Problems:   Breast cancer [facility])   Discharged Condition: good  Hospital Course: [name] was admitted for left total mastectomy and sentinel node biopsy with port a catheter placement for her left breast cancer. She did well after surgery. She had good pain control and worked with physical therapy. She was tolerating a diet, learned her drain care, and was ready to go home. PT did recommend a bedside commode for the patient for home and for showering.   Mild post operative leukocytosis is expected. No signs of infection. H&H Drifted with blood loss during surgery. No signs of bleeding.   Consults: None  Significant Diagnostic Studies:   Latest Reference Range & Units 12/09/23 03:40  Sodium 135 - 145 mmol/L 135  Potassium 3.5 - 5.1 mmol/L 4.0  Chloride 98 - 111 mmol/L 104  CO2 22 - 32 mmol/L 20 (L)  Glucose 70 - 99 mg/dL 854 (H)  BUN 6 - 20 mg/dL 34 (H)  Creatinine 9.55 - 1.00 mg/dL 8.77 (H)  Calcium  8.9 - 10.3 mg/dL 8.8 (L)  Anion gap 5 - 15  11  (L): Data is abnormally low (H): Data is abnormally high   Latest Reference Range & Units 12/09/23 03:40  WBC 4.0 - 10.5 K/uL 10.8 (H)  RBC 3.87 - 5.11 MIL/uL 3.44 (L)  Hemoglobin 12.0 - 15.0 g/dL 88.9 (L)  HCT 63.9 - 53.9 % 34.5 (L)  MCV 80.0 - 100.0 fL 100.3 (H)  MCH 26.0 - 34.0 pg 32.0  MCHC 30.0 - 36.0 g/dL 68.0  RDW 88.4 - 84.4 % 13.6  Platelets 150 - 400 K/uL 199  nRBC 0.0 - 0.2 % 0.0  (H): Data is abnormally high (L): Data is abnormally low  CLINICAL DATA:  [id number] Port-A-Cath in place 369461   EXAM: PORTABLE CHEST 1 VIEW   COMPARISON:  None Available.   FINDINGS: Right chest port with the tip in the  proximal right atrium. No pleural effusion. No pneumothorax. Low lung volumes. Likely normal cardiac and mediastinal contours when accounting for low lung volumes. Left basilar atelectasis. No radiographically apparent displaced rib fracture. Visualized upper abdomen is unremarkable.   IMPRESSION: Right chest port with the tip in the proximal right atrium. No pneumothorax.     Electronically Signed   By: Meredith Sanchez M.D.   On: 12/08/2023 11:15  Treatments: Left total mastectomy and sentinel lymph node biopsy, right port a catheter placement- subclavian   Discharge Exam: Blood pressure 119/70, pulse 80, temperature 98.3 F (36.8 C), temperature source Oral, resp. rate 17, height 5' 6 (1.676 m), weight 83 kg, SpO2 94%. General appearance: alert and no distress Resp: normal work of breathing Breasts: left mastectomy site c/d/I with staples, minor bruising inferior flap, JP with SS Output, port site right chest c/d/I with dermabond, no signs of erythema or drainage, bilateral   Disposition: Discharge disposition: 01-Home or Self Care       Discharge Instructions     Call MD for:  difficulty breathing, headache or visual disturbances   Complete by: As directed    Call MD for:  extreme fatigue   Complete by: As directed    Call MD for:  persistant dizziness or light-headedness   Complete  by: As directed    Call MD for:  persistant nausea and vomiting   Complete by: As directed    Call MD for:  redness, tenderness, or signs of infection (pain, swelling, redness, odor or green/yellow discharge around incision site)   Complete by: As directed    Call MD for:  severe uncontrolled pain   Complete by: As directed    Call MD for:  temperature >100.4   Complete by: As directed    Increase activity slowly   Complete by: As directed       Allergies as of 12/09/2023       Reactions   Ace Inhibitors Shortness Of Breath   Roof of mouth  Tingling, throat closing   Penicillins     Yeast infections    Poison Ivy Extract Rash        Medication List     TAKE these medications    amlodipine -olmesartan  10-20 MG tablet Commonly known as: AZOR  TAKE 1 TABLET BY MOUTH DAILY   APPLE CIDER VINEGAR PO Take 1 capsule by mouth daily.   ASHWAGANDHA PO Take 2 tablets by mouth daily.   ASPIRIN  81 PO Take 1 tablet by mouth daily.   atorvastatin  40 MG tablet Commonly known as: LIPITOR TAKE 1 TABLET(40 MG) BY MOUTH DAILY   docusate sodium  100 MG capsule Commonly known as: COLACE Take 1 capsule (100 mg total) by mouth 2 (two) times daily as needed for mild constipation.   gabapentin  100 MG capsule Commonly known as: NEURONTIN  Take 1 capsule (100 mg total) by mouth at bedtime.   multivitamin tablet Take 1 tablet by mouth daily.   ondansetron  4 MG disintegrating tablet Commonly known as: ZOFRAN -ODT Take 1 tablet (4 mg total) by mouth every 6 (six) hours as needed for nausea.   oxyCODONE  5 MG immediate release tablet Commonly known as: Oxy IR/ROXICODONE  Take 1-2 tablets (5-10 mg total) by mouth every 4 (four) hours as needed for severe pain (pain score 7-10) or breakthrough pain.   triamterene -hydrochlorothiazide  37.5-25 MG tablet Commonly known as: MAXZIDE -25 TAKE 1 TABLET BY MOUTH DAILY   VITAMIN C PO Take 1 capsule by mouth daily.               Durable Medical Equipment  (From admission, onward)           Start     Ordered   12/08/23 1606  For home use only DME Bedside commode  Once       Comments: Post op weakness, will benefit from 3 N 1 Bedside commode  Question:  Patient needs a bedside commode to treat with the following condition  Answer:  Gait difficulty   12/08/23 1606            Follow-up Information     Meredith Manuelita BROCKS, MD Follow up on 12/15/2023.   Specialty: General Surgery Why: jp removal Contact information: [address] [place] [facility] [address] [phone]                Future Appointments   Date Time Provider Department Center  12/15/2023  1:15 PM Meredith Manuelita BROCKS, MD [facility] None  12/23/2023  1:00 PM Meredith Manuelita BROCKS, MD [facility] None  01/05/2024  2:15 PM Meredith Hai, MD [facility] None  01/13/2024  1:00 PM Meredith Sanchez CROME, Counselor [facility] None  04/14/2024  1:00 PM [name], MD [facility]    Signed: Manuelita Sanchez Meredith 12/09/2023, 11:32 AM

## 2023-12-09 NOTE — Discharge Instructions (Signed)
 Discharge instructions after breast surgery:   Common Complaints: Pain and bruising at the incision sites.  Swelling at the incision sites. Stiffness of the arm.   Diet/ Activity: Diet as tolerated.  You may shower but do not take hot showers as this can disrupt the glue. Rest and listen to your body, but do not remain in bed all day.  Walk everyday for at least 15-20 minutes. Deep cough and move around every 1-2 hours in the first few days after surgery.  Do not lift > 10 lbs for the first 2 weeks after surgery. Do not do anything that makes you feel like you are putting unnecessary pull or stretch on the incision sites.  Do move your arm and shoulder (see exercises options below). If you do not move then you can get stiff and hurt more.  Do not pick at the dermabond glue on your port incision site.  This glue film will remain in place for 1-2 weeks and will start to peel off.  Do not pick at the staples.  Wear a pad on the area and change out daily starting 12/10/2023. You can buy large feminine pads to keep over this area for may drainage.  Wear the breast binder to help with pressure on the mastectomy site.  JP Drain Care:  Please keep the drain clean and dry. Strip the tubing on the drain daily or if you notice clotting in the tubing.  Replace the gauze/ tape around the drain if it gets dirty or wet/ saturated. Please do not mess with or cut the stitch that is keeping the drain in place. Secure the drain to your clothes so that it does not get dislodged.  You may want to wear a binder to help prevent it from being pulled out.  Please record the output from the drain daily including the color and the amount in milliliters.  Please keep the drain covered with plastic and tape when you shower so that it does not get wet.  It is ok if your staples get wet after 12/10/2023, pat the area dry.   Do not place lotions or balms on your incision unless instructed to specifically by Dr. Kallie.    Pain Expectations and Narcotics: -After surgery you will have pain associated with your incisions and this is normal. The pain is muscular and nerve pain, and will get better with time. -You are encouraged and expected to take non narcotic medications like tylenol  and ibuprofen  (when able) to treat pain as multiple modalities can aid with pain treatment. -Narcotics are only used when pain is severe or there is breakthrough pain. -You are not expected to have a pain score of 0 after surgery, as we cannot prevent pain. A pain score of 3-4 that allows you to be functional, move, walk, and tolerate some activity is the goal. The pain will continue to improve over the days after surgery and is dependent on your surgery. -Due to  law, we are only able to give a certain amount of pain medication to treat post operative pain, and we only give additional narcotics on a patient by patient basis.  -For most laparoscopic surgery, studies have shown that the majority of patients only need 10-15 narcotic pills, and for open surgeries most patients only need 15-20.   -Having appropriate expectations of pain and knowledge of pain management with non narcotics is important as we do not want anyone to become addicted to narcotic pain medication.  -Using ice  packs in the first 48 hours and heating pads after 48 hours, wearing an abdominal binder (when recommended), and using over the counter medications are all ways to help with pain management.   -Simple acts like meditation and mindfulness practices after surgery can also help with pain control and research has proven the benefit of these practices.  Medication: Take tylenol  and ibuprofen  as needed for pain control, alternating every 4-6 hours.  Example:  Tylenol  1000mg  @ 6am, 12noon, 6pm, (Do not exceed 4000mg  of tylenol  a day). Ibuprofen  800mg  @ 9am, 3pm, 9pm, 3am (Do not exceed 3600mg  of ibuprofen  a day).  Take Roxicodone  for breakthrough pain  every 4 hours.  Take Colace for constipation related to narcotic pain medication. If you do not have a bowel movement in 2 days, take Miralax  over the counter.  Drink plenty of water  to also prevent constipation.   Contact Information: If you have questions or concerns, please call our office, [phone], Monday- Thursday 8AM-5PM and Friday 8AM-12Noon.  If it is after hours or on the weekend, please call [facility], [phone], [phone], and ask to speak to the surgeon on call for Dr. Kallie at [facility].   Exercises After Breast Surgery Do at least a few of the exercises below twice a day. It is ok to start the day after surgery and gradually build up the amount and type of exercises you do. Link to the exercises with pictures (relaythis.com.au).   Deep Breathing Exercise Deep breathing can help you relax and ease discomfort and tightness around your incision (surgical cut). It's also a very good way to relieve stress during the day.  Sit comfortably in a chair. Take a slow, deep breath through your nose. Let your chest and belly expand. Breathe out slowly through your mouth. Repeat as many times as needed.  Arm and Shoulder Exercises Doing arm and shoulder exercises will help you get back your full range of motion on your affected side (the side where you had your surgery). With full range of motion, you'll be able to: Move your arm over your head and out to the side Move your arm behind your neck Move your arm to the middle of your back Do each of the exercises below 5 times a day. Keep doing this until you have a full range of motion again and can use your arm as you did before surgery in all your normal activities. This includes activities at work, at home, and in recreation or sports. If you had limited movement in your arm before surgery, your goal will be to get back as much movement as you had  before.  If you get your full range of motion back quickly, keep doing these exercises once a day instead of 5 times a day. This is especially true if you feel any tightness in your chest, shoulder, or under your affected arm. These exercises can help keep scar tissue from forming in your armpit and shoulder. Scar tissue can limit your arm movements later.  If you still have trouble moving your shoulder 4 weeks after your surgery, tell your surgeon. They'll tell you if you need more rehabilitation, such as physical or occupational therapy.  If you had one of the following surgeries, you can do the following set of exercises on the first day after your surgery, as long as your surgeon tells you it's safe.  Shoulder rolls The shoulder roll is a good exercise to start with because it gently stretches  your chest and shoulder muscles.  Stand or sit comfortably with your arms relaxed at your sides. Start with backward shoulder rolls. In a circular motion, bring your shoulders forward, up, backward, and down. Do this 10 times. Switch directions and do 10 forward shoulder rolls. Bring your shoulders backward, up, forward, and down. Do this 10 times. Try to make the circles as big as you can and move both shoulders at the same time. If you have some tightness across your incision or chest, start with smaller circles and make them bigger as the tightness decreases. The backward direction might feel a little tighter across your chest than the forward direction. This will get better with practice.  Shoulder wings The shoulder wings exercise will help you get back outward movement of your shoulder. You can do this exercise while sitting or standing.  Place your hands on your chest or collarbone. Raise your elbows out to the side, limiting your range of motion as instructed by your healthcare team. Slowly lower your elbows. Do this 10 times. Then, slowly lower your hands. If you feel discomfort while doing  this exercise, hold your position and do the deep breathing exercise. If the discomfort passes, raise your elbows a little higher. If it doesn't pass, don't raise your elbows any higher. Finish the exercise raising your elbows only high enough to feel a gentle stretch and no discomfort.  Arm circles If you had surgery on both breasts, do this exercise with both arms, 1 arm at a time. Don't do this exercise with both arms at the same time. This will put too much pressure on your chest.  Stand with your feet slightly apart for balance. Raise your affected arm out to the side as high as you can, limiting your range of movement as instructed by your healthcare team. Start making slow, backward circles in the air with your arm. Make sure you're moving your arm from your shoulder, not your elbow. Keep your elbow straight. Increase the size of the circles until they're as big as you can comfortably make them, limiting your range of motion as instructed by your healthcare team. If you feel any aching or if your arm is tired, take a break. Keep doing the exercise when you feel better. Do 10 full backward circles. Then, slowly lower your arm to your side. Rest your arm for a moment. Follow steps 1 to 4 again, but this time make slow, forward circles.  W exercise You can do the W exercise while sitting or standing.  Form a "W" with your arms out to the side and palms facing forward (see Figure 4). Try to bring your hands up so they're even with your face. If you can't raise your arms that high, bring them to the highest comfortable position. Make sure to limit your range of motion as instructed by your healthcare team. Pinch your shoulder blades together and downward, as if you're squeezing a pencil between them. If you feel discomfort, stop at that position and do the deep breathing exercise. If the discomfort passes, try to bring your arms back a little further. If it doesn't pass, don't reach any  further. Hold the furthest position that doesn't cause discomfort. Squeeze your shoulder blades together and downward for 5 seconds. Slowly bring your arms back down to the starting position. Repeat this movement 10 times.  Back Climb You can do the back climb stretch while sitting or standing. You'll need a timer or stopwatch.  Place  your hands behind your back. Hold the hand on your affected side with your other hand. If you had surgery on both breasts, use the arm that moves most easily to hold the other. Slowly slide your hands up the center of your back as far as you can. If you feel tightness near your incision, stop at that position and do the deep breathing exercise. If the tightness decreases, try to slide your hands up a little further. If it doesn't decrease, don't slide your hands up any further. Hold the highest position you can for 1 minute. Use your stopwatch or timer to keep track. You should feel a gentle stretch in your shoulder area. After 1 minute, slowly lower your hands.  Hands behind neck You can do the hands behind neck stretch while sitting or standing. You'll need a timer or stopwatch.  Clasp your hands together on your lap or in front of you. Slowly raise your hands toward your head, keeping your elbows together in front of you, not out to the sides. Keep your head level. Don't bend your neck or head forward. Slide your hands over your head until you reach the back of your neck. When you get to this point, spread your elbows out to the sides. Hold this position for 1 minute. Use your stopwatch or timer to keep track. Breathe normally. Don't hold your breath as you stretch your body. If you have some tightness across your incision or chest, hold your position and do the deep breathing exercise. If the tightness decreases, continue with the movement. If the tightness stays the same, reach up and stretch your elbows back as best as you can without causing discomfort. Hold  the position you're most comfortable in for 1 minute. Slowly come out of the stretch by bringing your elbows together and sliding your hands over your head. Then, slowly lower your arms.  Forward wall crawls You'll need 2 pieces of tape for the forward wall crawl exercise.  Stand facing a wall. Your toes should be about 6 inches (15 centimeters) from the wall. Reach as high as you can with your unaffected arm. Oneil that point with a piece of tape. This will be the goal for your affected arm. If you had surgery on both breasts, set your goal using the arm that moves most comfortably. Place both hands against the wall at a level that's comfortable. Crawl your fingers up the wall as far as you can, keeping them even with each other.. Try not to look up toward your hands or arch your back. When you get to the point where you feel a good stretch, but not pain, do the deep breathing exercise. Return to the starting position by crawling your fingers back down the wall. Repeat the wall crawl 10 times. Each time you raise your hands, try to crawl a little bit higher. On the 10th crawl, use the other piece of tape to mark the highest point you reached with your affected arm. This will let you to see your progress each time you do this exercise. As you become more flexible, you may need to take a step closer to the wall so you can reach a little higher.   Side wall crawls You'll also need 2 pieces of tape for the side wall crawl exercise.  You shouldn't feel pain while doing this exercise. It's normal to feel some tightness or pulling across the side of your chest. Focus on your breathing until the tightness decreases.  Breathe normally throughout this exercise. Don't hold your breath.  Be careful not to turn your body toward the wall while doing this exercise. Make sure only the side of your body faces the wall.  If you had surgery on both breasts, start with step 3.  Stand with your unaffected side  closest to the wall, about 1 foot (30.5 centimeters) away from the wall. Reach as high as you can with your unaffected arm. Oneil that point with a piece of tape (see Figure 8). This will be the goal for your affected arm. Turn your body so your affected side is now closest to the wall. If you had surgery on both breasts, start with either side closest to the wall. Crawl your fingers up the wall as far as you can. When you get to the point where you feel a good stretch, but not pain, do the deep breathing exercise. Return to the starting position by crawling your fingers back down the wall. Repeat this exercise 10 times. On your 10th crawl, use a piece of tape to mark the highest point you reached with your affected arm. This will let you see your progress each time you do the exercise. If you had surgery on both breasts, repeat the exercise with your other arm.  Swelling After your surgery, you may have some swelling or puffiness in your hand or arm on your affected side. This is normal and usually goes away on its own.  If you notice swelling in your hand or arm, follow the tips below to help the swelling go away.  Raise your arm above your head and do hand pumps several times a day. To do hand pumps, slowly open and close your fist 10 times. This will help drain the fluid out of your arm. Don't hold your arm straight up over your head for more than a few minutes. This can cause your arm muscles to get tired. Raise your arm to the side a few times a day for about 20 minutes at a time. To do this, sit or lie down on your back. Rest your arm on a few pillows next to you so it's raised above the level of your heart. If you're able to sleep on your unaffected side, you can place 1 or 2 pillows in front of you and rest your affected arm on them while you sleep. If the swelling doesn't go down within 4 to 6 weeks, call your surgeon or nurse.

## 2023-12-09 NOTE — Plan of Care (Signed)
   Problem: Education: Goal: Knowledge of General Education information will improve Description Including pain rating scale, medication(s)/side effects and non-pharmacologic comfort measures Outcome: Progressing   Problem: Health Behavior/Discharge Planning: Goal: Ability to manage health-related needs will improve Outcome: Progressing

## 2023-12-09 NOTE — Progress Notes (Signed)
 1 Day Post-Op  Subjective: Meredith Sanchez is a 58 year old female w/ PMHx of HTN, HLD, MI 5 years ago who was recently found to have triple negative breast cancer on the left who had a Left total mastectomy, sentinel lymph node biopsy followed by right subclavian port a catheter on 12/08/23 for chemotherapy with Dr. Rogers. Today she feels well and says that she is in good spirits.   Objective: Vital signs in last 24 hours: Temp:  [97.4 F (36.3 C)-98.3 F (36.8 C)] 98.3 F (36.8 C) (01/09 0348) Pulse Rate:  [76-89] 80 (01/09 0348) Resp:  [13-18] 17 (01/09 0348) BP: (95-118)/(58-75) 113/66 (01/09 0348) SpO2:  [89 %-98 %] 94 % (01/09 0348) Weight:  [83 kg] 83 kg (01/08 1207) Last BM Date : 12/07/23  Intake/Output from previous day: 01/08 0701 - 01/09 0700 In: 900 [P.O.:100; I.V.:700; IV Piggyback:100] Out: 85 [Drains:60; Blood:25] Intake/Output this shift: No intake/output data recorded.  General appearance: alert, cooperative, and appears stated age Head: Normocephalic, without obvious abnormality, atraumatic Eyes: conjunctivae/corneas clear. PERRL, EOM's intact. Fundi benign. Chest wall / derm: S/p left mastectomy w/ staples in place. No erythema, purulence to the surgical site. JP drain in place left upper chest wall w/ just under 75 cc's of serosanguinous drainage in bulb. Surgical scar to right upper chest s/p port placement, no signs of infection there either.  Lab Results:  Recent Labs    12/09/23 0340  WBC 10.8*  HGB 11.0*  HCT 34.5*  PLT 199   BMET Recent Labs    12/09/23 0340  NA 135  K 4.0  CL 104  CO2 20*  GLUCOSE 145*  BUN 34*  CREATININE 1.22*  CALCIUM  8.8*   PT/INR No results for input(s): LABPROT, INR in the last 72 hours.  Studies/Results: DG Chest Port 1 View Result Date: 12/08/2023 CLINICAL DATA:  [id number] Port-A-Cath in place [id number] EXAM: PORTABLE CHEST 1 VIEW COMPARISON:  None Available. FINDINGS: Right chest port with the tip in the  proximal right atrium. No pleural effusion. No pneumothorax. Low lung volumes. Likely normal cardiac and mediastinal contours when accounting for low lung volumes. Left basilar atelectasis. No radiographically apparent displaced rib fracture. Visualized upper abdomen is unremarkable. IMPRESSION: Right chest port with the tip in the proximal right atrium. No pneumothorax. Electronically Signed   By: Lyndall Gore M.D.   On: 12/08/2023 11:15   DG C-Arm 1-60 Min-No Report Result Date: 12/08/2023 Fluoroscopy was utilized by the requesting physician.  No radiographic interpretation.    Anti-infectives: Anti-infectives (From admission, onward)    Start     Dose/Rate Route Frequency Ordered Stop   12/08/23 0630  ceFAZolin  (ANCEF ) IVPB 2g/100 mL premix        2 g 200 mL/hr over 30 Minutes Intravenous On call to O.R. 12/08/23 0625 12/08/23 0815       Assessment/Plan: s/p Procedure(s): SIMPLE MASTECTOMY WITH AXILLARY SENTINEL NODE BIOPSY INSERTION SUBCLAVIAN PORT-A-CATH  #Triple Negative Left Breast Cancer Meredith Sanchez is a 58 year old female s/p left mastectomy and axillary sentinel node biopsy w/ subclavian port-a-cath site. She is stable, feeling well, afebrile with no signs of infection. WBC marginally elevated at 10.8, unremarkable for infection given surgical context. Some expected drainage in JP bulb. -Discharge today w/ follow up in clinic   LOS: 0 days    Meredith Sanchez 12/09/2023

## 2023-12-10 LAB — SURGICAL PATHOLOGY

## 2023-12-10 NOTE — Anesthesia Postprocedure Evaluation (Signed)
 Anesthesia Post Note  Patient: Meredith Sanchez  Procedure(s) Performed: SIMPLE MASTECTOMY WITH AXILLARY SENTINEL NODE BIOPSY (Left: Breast) INSERTION SUBCLAVIAN PORT-A-CATH (Right: Chest)  Patient location during evaluation: Phase II Anesthesia Type: General Level of consciousness: awake Pain management: pain level controlled Vital Signs Assessment: post-procedure vital signs reviewed and stable Respiratory status: spontaneous breathing and respiratory function stable Cardiovascular status: blood pressure returned to baseline and stable Postop Assessment: no headache and no apparent nausea or vomiting Anesthetic complications: no Comments: Late entry   No notable events documented.   Last Vitals:  Vitals:   12/09/23 0952 12/09/23 1257  BP: 119/70 (!) 132/58  Pulse:  88  Resp:  18  Temp:  37 C  SpO2:  95%    Last Pain:  Vitals:   12/09/23 1257  TempSrc: Oral  PainSc:                  Yvonna JINNY Bosworth

## 2023-12-13 NOTE — Progress Notes (Signed)
 Can you let her know that cancer is out, margins are clear and lymph nodes 6/6 were negative for cancer.

## 2023-12-15 ENCOUNTER — Encounter: Payer: Self-pay | Admitting: General Surgery

## 2023-12-15 ENCOUNTER — Ambulatory Visit (INDEPENDENT_AMBULATORY_CARE_PROVIDER_SITE_OTHER): Payer: Medicaid Other | Admitting: General Surgery

## 2023-12-15 VITALS — BP 117/76 | HR 87 | Temp 97.7°F | Resp 14 | Ht 66.0 in | Wt 193.0 lb

## 2023-12-15 DIAGNOSIS — Z171 Estrogen receptor negative status [ER-]: Secondary | ICD-10-CM

## 2023-12-15 DIAGNOSIS — C50212 Malignant neoplasm of upper-inner quadrant of left female breast: Secondary | ICD-10-CM

## 2023-12-15 MED ORDER — OXYCODONE HCL 5 MG PO TABS: 5.0000 mg | ORAL_TABLET | ORAL | 0 refills | Status: DC | PRN

## 2023-12-15 NOTE — Patient Instructions (Addendum)
 Keep bandage on for 2 days. After that can shower and pat staple dry.  Can see how you feel not wearing the binder now. Call with issues. See you next week.  Ok to drive if you can slam on breaks, stir wheel and not on narcotics.

## 2023-12-15 NOTE — Progress Notes (Signed)
 [facility]  No major issues/ some swelling under axilla. JP with 20-30cc of SS fluid a day.  BP 117/76   Pulse 87   Temp 97.7 F (36.5 C) (Oral)   Resp 14   Ht 5\' 6"  (1.676 m)   Wt 193 lb (87.5 kg)   SpO2 94%   BMI 31.15 kg/m  1/2 staples removed, mastectomy site c/d/I and flaps look healthy, JP removed, SS fluid in bulb  Port site right chest without erythema or drainage   Patient s/p left mastectomy for triple negative cancer.  Keep bandage on for 2 days. After that can shower and pat staple dry.  Can see how you feel not wearing the binder now. Call with issues. See you next week.  Ok to drive if you can slam on breaks, stir wheel and not on narcotics.   Future Appointments  Date Time Provider Department Center  12/23/2023  1:00 PM Awilda Bogus, MD [facility] None  01/05/2024  2:15 PM [name], MD [facility] None  01/13/2024  1:00 PM Jesusa Moros, Counselor [facility] None  04/14/2024  1:00 PM Towanda Fret, MD [facility]   Deena Farrier, MD [facility] [address] Anise Barlow [place], [place] [address] [phone] (office)

## 2023-12-23 ENCOUNTER — Encounter: Payer: Self-pay | Admitting: General Surgery

## 2023-12-23 ENCOUNTER — Ambulatory Visit: Payer: Medicaid Other | Admitting: General Surgery

## 2023-12-23 VITALS — BP 111/72 | HR 87 | Temp 97.5°F | Resp 14 | Ht 66.0 in | Wt 186.0 lb

## 2023-12-23 DIAGNOSIS — N6489 Other specified disorders of breast: Secondary | ICD-10-CM

## 2023-12-23 DIAGNOSIS — Z171 Estrogen receptor negative status [ER-]: Secondary | ICD-10-CM

## 2023-12-23 DIAGNOSIS — C50212 Malignant neoplasm of upper-inner quadrant of left female breast: Secondary | ICD-10-CM

## 2023-12-23 MED ORDER — OXYCODONE HCL 5 MG PO TABS: 5.0000 mg | ORAL_TABLET | Freq: Two times a day (BID) | ORAL | 0 refills | Status: DC | PRN

## 2023-12-23 NOTE — Patient Instructions (Signed)
Will take out staples next week. Do exercises for movement.

## 2023-12-24 NOTE — Progress Notes (Signed)
[facility]  Doing well. Not doing exercises to move arm though. Sore.   BP 111/72   Pulse 87   Temp (!) 97.5 F (36.4 C) (Oral)   Resp 14   Ht 5\' 6"  (1.676 m)   Wt 186 lb (84.4 kg)   SpO2 92%   BMI 30.02 kg/m  Left mastectomy site healing, staples c/d/I with no erythema or drainage, has a seroma that I can feel the fluid wave. Possible seroma in  the axilla, no webbing or bands noted   Patient s/p left mastectomy and SLNB. Doing wel. With the seroma want to leave the staples in another week. Told her to exercise and do the arm movement to prevent the bands/ webs that can form and explained that physical therapy sometimes has to break these up with massage and therapy if they form.  Will take out staples next week. Do exercises for movement.  Algis Greenhouse, MD [facility] [address] Vella Raring [place], [place] [address] [phone] (office)

## 2023-12-28 ENCOUNTER — Ambulatory Visit: Payer: Self-pay | Admitting: Family Medicine

## 2023-12-28 NOTE — Telephone Encounter (Signed)
Reason for Disposition  Earache  (Exceptions: brief ear pain of < 60 minutes duration, earache occurring during air travel  Answer Assessment - Initial Assessment Questions 1. LOCATION: "Which ear is involved?"     Left ear  2. ONSET: "When did the ear start hurting"      1 week ago  3. SEVERITY: "How bad is the pain?"  (Scale 1-10; mild, moderate or severe)   - MILD (1-3): doesn't interfere with normal activities    - MODERATE (4-7): interferes with normal activities or awakens from sleep    - SEVERE (8-10): excruciating pain, unable to do any normal activities      3 or 4 out of 10  4. URI SYMPTOMS: "Do you have a runny nose or cough?"     No   5. FEVER: "Do you have a fever?" If Yes, ask: "What is your temperature, how was it measured, and when did it start?"     No  6. CAUSE: "Have you been swimming recently?", "How often do you use Q-TIPS?", "Have you had any recent air travel or scuba diving?"     No. Ear is probably blocked from the water during shower  7. OTHER SYMPTOMS: "Do you have any other symptoms?" (e.g., headache, stiff neck, dizziness, vomiting, runny nose, decreased hearing)     Mild dizziness comes and goes.  Protocols used: Earache-A-AH    Chief Complaint: Earache  Symptoms: Left ear blocked and hurts Frequency: Ongoing for 1 week Pertinent Negatives: Patient denies fever Disposition: [] ED /[x] Urgent Care (no appt availability in office) / [x] Appointment(In office/virtual)/ []  [facility]/ [] Home Care/ [] Refused Recommended Disposition /[]  Mobile Bus/ []  Follow-up with PCP  Additional Notes: Patient stated she has a left earache and her ear is clogged.  Her pain level is 3 or 4 out of 10. Patient also stated that she experiences intermittent periods of mild dizziness. Symptoms have been going on for 1 week. Offered to schedule an appt, but it doesn't work with patient's schedule. Patient stated she will call back to make an appointment.  I also recommended that patient can go to an urgent care facility if that works better for her schedule. Patient verbalized understanding.

## 2023-12-28 NOTE — Telephone Encounter (Signed)
Copied from CRM [id number]. Topic: Clinical - Medication Refill >> Dec 28, 2023 12:23 PM Lorretta Harp wrote: Most Recent Primary Care Visit:  Provider: Kerri Perches  Department: [facility]  Visit Type: OFFICE VISIT  Date: 09/10/2023  Medication: Patient is requesting an antibiotic for an ear ache  Has the patient contacted their pharmacy? Yes (Agent: If no, request that the patient contact the pharmacy for the refill. If patient does not wish to contact the pharmacy document the reason why and proceed with request.) (Agent: If yes, when and what did the pharmacy advise?)  Is this the correct pharmacy for this prescription? Yes If no, delete pharmacy and type the correct one.  This is the patient's preferred pharmacy:  [facility] [phone] - [place], [place] - [address] AT [facility] [address] [place] [address] Phone: [phone] Fax: [phone]   Has the prescription been filled recently? No  Is the patient out of the medication? Yes  Has the patient been seen for an appointment in the last year OR does the patient have an upcoming appointment? Yes  Can we respond through MyChart? No  Agent: Please be advised that Rx refills may take up to 3 business days. We ask that you follow-up with your pharmacy.   Chief Complaint: Medication Request  Additional Notes: Attempted to call patient at this time, no answer, left voicemail, callback queued.

## 2023-12-29 ENCOUNTER — Telehealth: Payer: Self-pay | Admitting: *Deleted

## 2023-12-29 NOTE — Telephone Encounter (Signed)
Completed [employer] Critical Illness form completed yesterday was e-mailed to [facility] today for provider review and signature.

## 2023-12-30 ENCOUNTER — Encounter: Payer: Self-pay | Admitting: General Surgery

## 2023-12-30 ENCOUNTER — Ambulatory Visit: Payer: Medicaid Other | Admitting: General Surgery

## 2023-12-30 VITALS — BP 136/73 | HR 83 | Temp 97.6°F | Resp 16 | Ht 66.0 in | Wt 183.0 lb

## 2023-12-30 DIAGNOSIS — C50212 Malignant neoplasm of upper-inner quadrant of left female breast: Secondary | ICD-10-CM

## 2023-12-30 DIAGNOSIS — N6489 Other specified disorders of breast: Secondary | ICD-10-CM

## 2023-12-30 DIAGNOSIS — Z171 Estrogen receptor negative status [ER-]: Secondary | ICD-10-CM

## 2023-12-30 NOTE — Patient Instructions (Signed)
Steristrips will peel off in the next 5-7 days. You can remove them once they are peeling off. It is ok to shower. Pat the area dry.  Activity as tolerated. Continue your physical therapy exercises. Call with issues or concerns.

## 2023-12-31 NOTE — Telephone Encounter (Signed)
On 12/29/2023 the [facility] [employer] Critical Illness claim form was returned signed by provider.  Successfully returned to [facility] via [phone]) then faxed to (SW) [employer] with signed [facility] authorization for record request process.  Mailed form to Hedy Jacob address on file. [address] [place] [address] Process completed with no further instructions received, actions performed or required by this nurse. On 12/30/2023 remote workday noted a "Secure Chat" sent 12/29/2023 after 1600 to call Hedy Jacob, she'd like to make changes.. Friday 12/31/2023 this nurse returned to office.  Message left for Hedy Jacob with above information.  Provided direct phone number requesting return call for further discussion as needed.

## 2023-12-31 NOTE — Progress Notes (Signed)
[facility]  Doing well. Doing more exercises this time. Moving around more.  BP 136/73   Pulse 83   Temp 97.6 F (36.4 C) (Oral)   Resp 16   Ht 5\' 6"  (1.676 m)   Wt 183 lb (83 kg)   SpO2 95%   BMI 29.54 kg/m  Left mastectomy site seroma improved, less fluid noted, no erythema or drainage, staples removed steri strips placed Slight induration/seroma in the axilla and side fat more inferiorly   Patient s/p left mastectomy and SLNB for cancer and port placement. She will see Dr. Ellin Saba next week.   Steristrips will peel off in the next 5-7 days. You can remove them once they are peeling off. It is ok to shower. Pat the area dry.  Activity as tolerated. Continue your physical therapy exercises. Call with issues or concerns.   Algis Greenhouse, MD [facility] [address] [place], [place] [address] [phone] (office)

## 2024-01-04 NOTE — Progress Notes (Signed)
 [facility] [address], [place] [address]   Clinic Day:  01/05/2024  Referring physician: [name], MD  Patient Care Team: [name], MD as PCP - General   ASSESSMENT & PLAN:   Assessment:  1.  T1 cN0 G3 ER/PR/HER2-left breast IDC: - Screening mammogram on 10/20/2022: Abnormal - Left breast diagnostic mammogram/ultrasound (10/26/2023): Linear oriented pleomorphic calcifications in the lower inner left breast span 5.9 cm.  There has been interval development of irregular mass associated with these calcifications in the lower inner left breast.  Ultrasound of the left breast shows irregular hypoechoic mass in the left breast at 9 o'clock position measuring 2 x 1.3 x 1.6 cm.  No abnormal lymph nodes in the left axilla. - Left breast 9:00 biopsy (11/02/2023): Poorly differentiated IDC, grade 3, ER/PR negative, HER2 (1+) by IHC.  Ki-67: 20%. - Left breast biopsy LIQ posterior extent and anterior extent (11/17/2023): DCIS, comedo, high nuclear grade. - PET scan (11/25/2023): No evidence of metastatic disease. - Left mastectomy and SLNB on 12/08/2023 by Dr. Kallie - Pathology: 3.9 x 2.2 x 1.5 cm invasive poorly differentiated ductal adenocarcinoma, grade 3 with extensive necrosis, high-grade DCIS with necrosis, margins free, negative angiolymphatic invasion.  0/6 lymph nodes involved.  pT2 pN0.  2. Social/Family History: -Lives at home with her daughter. Works full-time with mild physical activity at work. No tobacco use.  -Brother died of lung cancer in his early 1's with a history of tobacco use. No other family history of cancer.  Plan:  1.  T1 cN0 G3 triple negative left breast IDC: - We reviewed pathology report in detail. - Discussed role for adjuvant chemotherapy to decrease the chance of relapse. - Recommend dose dense AC x 4 followed by weekly paclitaxel  x 12. - Discussed schedule and side effects in detail.  Literature was given to the  patient. - Will tentatively start treatment in the next 1-2 weeks.  2.  Peripheral neuropathy: - She has tingling and occasional burning sensation in the toes at nighttime for the past 5 years.  She reports that she is prediabetic.  There is likelihood of worsening with paclitaxel .  Will closely monitor.  3.  High risk drug monitoring: - 2D echo on 11/23/2023: LVEF 55%.  Grade 1 diastolic dysfunction.  Small pericardial effusion.  Will closely monitor.  4. BRIP1 heterozygosity: - We reviewed genetic testing results which was positive for BRIP1 mutation, putting her at high risk for ovarian cancer.  Also at high risk for Fanconi anemia. - She had unilateral salpingo-oophorectomy more than 10 years ago.  I have recommended salpingo-oophorectomy of the remaining ovary.  She has an appointment with genetic counselor next week.  I also recommended that her daughters be tested for the same mutation.   Orders Placed This Encounter  Procedures   Magnesium     Standing Status:   Future    Expected Date:   01/19/2024    Expiration Date:   01/18/2025   CBC with Differential    Standing Status:   Future    Expected Date:   01/19/2024    Expiration Date:   01/18/2025   Comprehensive metabolic panel    Standing Status:   Future    Expected Date:   01/19/2024    Expiration Date:   01/18/2025   Magnesium     Standing Status:   Future    Expected Date:   02/02/2024    Expiration Date:   02/01/2025  CBC with Differential    Standing Status:   Future    Expected Date:   02/02/2024    Expiration Date:   02/01/2025   Comprehensive metabolic panel    Standing Status:   Future    Expected Date:   02/02/2024    Expiration Date:   02/01/2025   Magnesium     Standing Status:   Future    Expected Date:   02/16/2024    Expiration Date:   02/15/2025   CBC with Differential    Standing Status:   Future    Expected Date:   02/16/2024    Expiration Date:   02/15/2025   Comprehensive metabolic panel    Standing Status:    Future    Expected Date:   02/16/2024    Expiration Date:   02/15/2025   Magnesium     Standing Status:   Future    Expected Date:   03/01/2024    Expiration Date:   03/01/2025   CBC with Differential    Standing Status:   Future    Expected Date:   03/01/2024    Expiration Date:   03/01/2025   Comprehensive metabolic panel    Standing Status:   Future    Expected Date:   03/01/2024    Expiration Date:   03/01/2025      LILLETTE Hummingbird R Sanchez,acting as a scribe for Alean Stands, MD.,have documented all relevant documentation on the behalf of Alean Stands, MD,as directed by  Alean Stands, MD while in the presence of Alean Stands, MD.  I, Alean Stands MD, have reviewed the above documentation for accuracy and completeness, and I agree with the above.     Alean Stands, MD   2/5/20252:55 PM  CHIEF COMPLAINT/PURPOSE OF CONSULT:   Diagnosis: Triple negative breast cancer of upper-inner quadrant of left female breast   Cancer Staging  Breast cancer of upper-inner quadrant of left female breast [facility]) Staging form: Breast, AJCC 8th Edition - Clinical stage from 11/15/2023: Stage IIB (cT2, cN0(sn), cM0, G3, ER-, PR-, HER2-) - Signed by Stands Alean, MD on 01/05/2024    Prior Therapy: Left mastectomy and SLNB  Current Therapy: Dose dense AC followed by weekly paclitaxel    HISTORY OF PRESENT ILLNESS:   Oncology History  Breast cancer of upper-inner quadrant of left female breast (HCC)  11/15/2023 Initial Diagnosis   Breast cancer of upper-inner quadrant of left female breast (HCC)    Genetic Testing   Likely pathogenic variant in BRIP1 called c.3196del (p.Ser1066Hisfs*12) identified on the [facility] Common Hereditary Cancers+RNA panel. VUS in CTNNA1 called c.1070G>A (p.Arg357His) identified. The report date is 11/29/2023.  The Common Hereditary Cancers Panel + RNA offered by [facility] includes sequencing and/or deletion duplication testing of the  following 48 genes: APC*, ATM*, AXIN2, BAP1, BARD1, BMPR1A, BRCA1, BRCA2, BRIP1, CDH1, CDK4, CDKN2A (p14ARF), CDKN2A (p16INK4a), CHEK2, CTNNA1, DICER1*, EPCAM*, FH*, GREM1*, HOXB13, KIT, MBD4, MEN1*, MLH1*, MSH2*, MSH3*, MSH6*, MUTYH, NF1*, NTHL1, PALB2, PDGFRA, PMS2*, POLD1*, POLE, PTEN*, RAD51C, RAD51D, SDHA*, SDHB, SDHC*, SDHD, SMAD4, SMARCA4, STK11, TP53, TSC1*, TSC2, VHL.    11/15/2023 Cancer Staging   Staging form: Breast, AJCC 8th Edition - Clinical stage from 11/15/2023: Stage IIB (cT2, cN0(sn), cM0, G3, ER-, PR-, HER2-) - Signed by Stands Alean, MD on 01/05/2024 Histopathologic type: Infiltrating duct carcinoma, NOS Stage prefix: Initial diagnosis Method of lymph node assessment: Sentinel lymph node biopsy Nuclear grade: G3 Histologic grading system: 3 grade system   01/19/2024 -  Chemotherapy   Patient is on  Treatment Plan : BREAST DOSE DENSE AC q14d / PACLitaxel  q7d         Meredith is a 58 y.o. female presenting to clinic today for evaluation of left breast cancer at the request of [name], MD.  Patient underwent a US  of the left breast and diagnostic bilateral MM on 10/26/23 after her last visit with her PCP that found: a highly suspicious 2 cm mass in the left breast at the 9 o'clock position with associated linear oriented calcifications, the mass with calcifications span 5.9 cm. She then had a left breast biopsy on 11/02/23 with pathology revealing: invasive poorly differentiated ductal adenocarcinoma, grade 3 with extensive necrosis and the tumor measuring 6.5 mm in greatest linear extent. The biopsy was negative for angiolymphatic invasion and microcalcifications. The tumor cells are negative for HER2 (1+), ER (0%), and PR (0%).   Today, she states that she is doing well overall. Her appetite level is at 100%. Her energy level is at 85%. She is accompanied by her sister.   She has an appointment with Dr. Kallie this afternoon and has 2 additional left breast  biopsies scheduled tomorrow. Prior to MM she had not felt any abnormalities on the breasts. She does have a history of right breast biopsy that was benign in 2018. She had menarche at age 58 and a total hysterectomy with appendectomy at 49 for fibroids in uterus. She denies any hot flashes or other menopausal symptoms. She has 3 daughters with her first childbirth at 70. She has used birth control for less than a year and never taken hormone replacements. She denies any new pains in the last few months. She does note tingling and numbness in the bilateral toes at night for the past 5 years after an MI in 2019. She does not have diabetes. She reports intermittent ankle swellings that she treats with compression socks.   INTERVAL HISTORY:   Meredith Sanchez is a 58 y.o. female presenting to the clinic today for follow-up of Triple negative breast cancer of upper-inner quadrant of left female breast. She was last seen by me on 11/16/23 in consultation.  Since her last visit, she underwent left mastectomy with sentinel node biopsy on 12/08/23 with Dr. Kallie. Pathology of the breast showed: invasive poorly differentiated ductal adenocarcinoma, grade 3, with extensive necrosis. Tumor size is 3.9 x 2.2 x 1.5 cm (pT2). Tumor was ER-/PR-/ Her2- (1+). All lymph nodes were negative for carcinoma.  Today, she states that she is doing well overall. Her appetite level is at 100%. Her energy level is at 50%.   PAST MEDICAL HISTORY:   Past Medical History: Past Medical History:  Diagnosis Date   Allergic rhinitis    Hyperlipidemia    Hypertension    Myocardial infarction [facility])    Obesity     Surgical History: Past Surgical History:  Procedure Laterality Date   ABDOMINAL HYSTERECTOMY     APPENDECTOMY  11/30/2001   BREAST BIOPSY Left 11/02/2023   path pending   BREAST BIOPSY Left 11/02/2023   US  LT BREAST BX W LOC DEV 1ST LESION IMG BX SPEC US  GUIDE 11/02/2023 [facility]OUND   BREAST BIOPSY Left  11/17/2023   MM LT BREAST BX W LOC DEV 1ST LESION IMAGE BX SPEC STEREO GUIDE 11/17/2023 [facility] MAMMOGRAPHY   BREAST BIOPSY Left 11/17/2023   MM LT BREAST BX W LOC DEV EA AD LESION IMG BX SPEC STEREO GUIDE 11/17/2023 [facility] MAM[facility]   COLONOSCOPY N/A 08/20/2014   Procedure:  COLONOSCOPY;  Surgeon: Margo LITTIE Haddock, MD;  Location: [facility] SUITE;  Service: Endoscopy;  Laterality: N/A;  2:15pm   LEFT OOPHORECTOMY  11/30/2001   PORTACATH PLACEMENT Right 12/08/2023   Procedure: INSERTION SUBCLAVIAN PORT-A-CATH;  Surgeon: Kallie Manuelita BROCKS, MD;  Location: [facility] ORS;  Service: General;  Laterality: Right;   SIMPLE MASTECTOMY WITH AXILLARY SENTINEL NODE BIOPSY Left 12/08/2023   Procedure: SIMPLE MASTECTOMY WITH AXILLARY SENTINEL NODE BIOPSY;  Surgeon: Kallie Manuelita BROCKS, MD;  Location: [facility] ORS;  Service: General;  Laterality: Left;   TUBAL LIGATION     VESICOVAGINAL FISTULA CLOSURE W/ TAH  11/30/2001    Social History: Social History   Socioeconomic History   Marital status: Married    Spouse name: Not on file   Number of children: 3   Years of education: Not on file   Highest education level: Not on file  Occupational History   Occupation: employed     Comment: [employer]  Tobacco Use   Smoking status: Never   Smokeless tobacco: Never  Vaping Use   Vaping status: Never Used  Substance and Sexual Activity   Alcohol use: No   Drug use: No   Sexual activity: Not on file  Other Topics Concern   Not on file  Social History Narrative   Not on file   Social Drivers of Health   Financial Resource Strain: Not on file  Food Insecurity: Patient Declined (12/08/2023)   Hunger Vital Sign    Worried About Running Out of Food in the Last Year: Patient declined    Ran Out of Food in the Last Year: Patient declined  Transportation Needs: Patient Declined (12/08/2023)   PRAPARE - Administrator, Civil Service (Medical): Patient declined    Lack of Transportation (Non-Medical): Patient declined   Physical Activity: Inactive (12/08/2018)   Received from [facility], [facility]   Exercise Vital Sign    Days of Exercise per Week: 0 days    Minutes of Exercise per Session: 0 min  Stress: Not on file  Social Connections: Not on file  Intimate Partner Violence: Patient Declined (12/08/2023)   Humiliation, Afraid, Rape, and Kick questionnaire    Fear of Current or Ex-Partner: Patient declined    Emotionally Abused: Patient declined    Physically Abused: Patient declined    Sexually Abused: Patient declined    Family History: Family History  Problem Relation Age of Onset   Diabetes Mother    Hypertension Mother    Hypertension Father    Brain cancer Brother 38       metastatic lung cancer to brain   Colon cancer Neg Hx     Current Medications:  Current Outpatient Medications:    amlodipine -olmesartan  (AZOR ) 10-20 MG tablet, TAKE 1 TABLET BY MOUTH DAILY, Disp: 90 tablet, Rfl: 3   APPLE CIDER VINEGAR PO, Take 1 capsule by mouth daily., Disp: , Rfl:    Ascorbic Acid (VITAMIN C PO), Take 1 capsule by mouth daily., Disp: , Rfl:    ASHWAGANDHA PO, Take 2 tablets by mouth daily., Disp: , Rfl:    ASPIRIN  81 PO, Take 1 tablet by mouth daily., Disp: , Rfl:    atorvastatin  (LIPITOR) 40 MG tablet, TAKE 1 TABLET(40 MG) BY MOUTH DAILY, Disp: 90 tablet, Rfl: 3   azelastine (ASTELIN) 0.1 % nasal spray, Place 2 sprays into both nostrils 2 (two) times daily., Disp: , Rfl:    docusate sodium  (COLACE) 100 MG capsule, Take 1  capsule (100 mg total) by mouth 2 (two) times daily as needed for mild constipation., Disp: 30 capsule, Rfl: 0   fluticasone (FLONASE) 50 MCG/ACT nasal spray, Place 2 sprays into both nostrils daily., Disp: , Rfl:    gabapentin  (NEURONTIN ) 100 MG capsule, Take 1 capsule (100 mg total) by mouth at bedtime., Disp: 90 capsule, Rfl: 1   Multiple Vitamin (MULTIVITAMIN) tablet, Take 1 tablet by mouth daily., Disp: , Rfl:    ondansetron  (ZOFRAN -ODT) 4 MG disintegrating tablet,  Take 1 tablet (4 mg total) by mouth every 6 (six) hours as needed for nausea., Disp: 20 tablet, Rfl: 0   oxyCODONE  (OXY IR/ROXICODONE ) 5 MG immediate release tablet, Take 1 tablet (5 mg total) by mouth every 12 (twelve) hours as needed for severe pain (pain score 7-10) or breakthrough pain., Disp: 8 tablet, Rfl: 0   triamterene -hydrochlorothiazide  (MAXZIDE -25) 37.5-25 MG tablet, TAKE 1 TABLET BY MOUTH DAILY, Disp: 90 tablet, Rfl: 3   Allergies: Allergies  Allergen Reactions   Ace Inhibitors Shortness Of Breath    Roof of mouth  Tingling, throat closing   Penicillins     Yeast infections    Poison Ivy Extract Rash    REVIEW OF SYSTEMS:   Review of Systems  Constitutional:  Negative for chills, fatigue and fever.  HENT:   Negative for lump/mass, mouth sores, nosebleeds, sore throat and trouble swallowing.   Eyes:  Negative for eye problems.  Respiratory:  Negative for cough and shortness of breath.   Cardiovascular:  Negative for chest pain, leg swelling and palpitations.  Gastrointestinal:  Negative for abdominal pain, constipation, diarrhea, nausea and vomiting.  Genitourinary:  Negative for bladder incontinence, difficulty urinating, dysuria, frequency, hematuria and nocturia.   Musculoskeletal:  Negative for arthralgias, back pain, flank pain, myalgias and neck pain.  Skin:  Negative for itching and rash.  Neurological:  Positive for dizziness. Negative for headaches and numbness.  Hematological:  Does not bruise/bleed easily.  Psychiatric/Behavioral:  Negative for depression, sleep disturbance and suicidal ideas. The patient is not nervous/anxious.   All other systems reviewed and are negative.    VITALS:   Blood pressure 127/85, pulse 85, temperature 97.6 F (36.4 C), temperature source Oral, resp. rate 16, weight 182 lb 15.7 oz (83 kg), SpO2 100%.  Wt Readings from Last 3 Encounters:  01/05/24 182 lb 15.7 oz (83 kg)  12/30/23 183 lb (83 kg)  12/23/23 186 lb (84.4 kg)     Body mass index is 29.53 kg/m.  Performance status (ECOG): 1 - Symptomatic but completely ambulatory  PHYSICAL EXAM:   Physical Exam Vitals and nursing note reviewed. Exam conducted with a chaperone present.  Constitutional:      Appearance: Normal appearance.  Cardiovascular:     Rate and Rhythm: Normal rate and regular rhythm.     Pulses: Normal pulses.     Heart sounds: Normal heart sounds.  Pulmonary:     Effort: Pulmonary effort is normal.     Breath sounds: Normal breath sounds.  Abdominal:     Palpations: Abdomen is soft. There is no hepatomegaly, splenomegaly or mass.     Tenderness: There is no abdominal tenderness.  Musculoskeletal:     Right lower leg: No edema.     Left lower leg: No edema.  Lymphadenopathy:     Cervical: No cervical adenopathy.     Right cervical: No superficial, deep or posterior cervical adenopathy.    Left cervical: No superficial, deep or posterior cervical adenopathy.  Upper Body:     Right upper body: No supraclavicular or axillary adenopathy.     Left upper body: No supraclavicular or axillary adenopathy.  Neurological:     General: No focal deficit present.     Mental Status: She is alert and oriented to person, place, and time.  Psychiatric:        Mood and Affect: Mood normal.        Behavior: Behavior normal.   Breast Exam Chaperone: Joesph Bohr, RN   LABS:      Latest Ref Rng & Units 12/09/2023    3:40 AM 11/16/2023    8:56 AM 04/09/2023    2:17 PM  CBC  WBC 4.0 - 10.5 K/uL 10.8  7.9  4.9   Hemoglobin 12.0 - 15.0 g/dL 88.9  86.4  87.2   Hematocrit 36.0 - 46.0 % 34.5  40.6  36.4   Platelets 150 - 400 K/uL 199  355  359       Latest Ref Rng & Units 12/09/2023    3:40 AM 11/16/2023    8:56 AM 09/10/2023    1:43 PM  CMP  Glucose 70 - 99 mg/dL 854  880  897   BUN 6 - 20 mg/dL 34  27  27   Creatinine 0.44 - 1.00 mg/dL 8.77  8.77  8.84   Sodium 135 - 145 mmol/L 135  137  140   Potassium 3.5 - 5.1 mmol/L 4.0  3.1   4.0   Chloride 98 - 111 mmol/L 104  100  104   CO2 22 - 32 mmol/L 20  25  19    Calcium  8.9 - 10.3 mg/dL 8.8  89.7  9.7   Total Protein 6.5 - 8.1 g/dL  8.2  7.2   Total Bilirubin <1.2 mg/dL  0.8  0.5   Alkaline Phos 38 - 126 U/L  52  67   AST 15 - 41 U/L  25  34   ALT 0 - 44 U/L  28  34      No results found for: CEA1, CEA / No results found for: CEA1, CEA No results found for: PSA1 No results found for: CAN199 No results found for: CAN125  No results found for: TOTALPROTELP, ALBUMINELP, A1GS, A2GS, BETS, BETA2SER, GAMS, MSPIKE, SPEI Lab Results  Component Value Date   TIBC 344 08/17/2014   FERRITIN 348 (H) 08/17/2014   FERRITIN 134 07/09/2014   IRONPCTSAT 28 08/17/2014   No results found for: LDH   STUDIES:   DG Chest Port 1 View Result Date: 12/08/2023 CLINICAL DATA:  [id number] Port-A-Cath in place [id number] EXAM: PORTABLE CHEST 1 VIEW COMPARISON:  None Available. FINDINGS: Right chest port with the tip in the proximal right atrium. No pleural effusion. No pneumothorax. Low lung volumes. Likely normal cardiac and mediastinal contours when accounting for low lung volumes. Left basilar atelectasis. No radiographically apparent displaced rib fracture. Visualized upper abdomen is unremarkable. IMPRESSION: Right chest port with the tip in the proximal right atrium. No pneumothorax. Electronically Signed   By: Lyndall Gore M.D.   On: 12/08/2023 11:15   DG C-Arm 1-60 Min-No Report Result Date: 12/08/2023 Fluoroscopy was utilized by the requesting physician.  No radiographic interpretation.

## 2024-01-05 ENCOUNTER — Inpatient Hospital Stay: Payer: BC Managed Care – PPO | Attending: Hematology | Admitting: Hematology

## 2024-01-05 VITALS — BP 127/85 | HR 85 | Temp 97.6°F | Resp 16 | Wt 183.0 lb

## 2024-01-05 DIAGNOSIS — Z5111 Encounter for antineoplastic chemotherapy: Secondary | ICD-10-CM | POA: Insufficient documentation

## 2024-01-05 DIAGNOSIS — G629 Polyneuropathy, unspecified: Secondary | ICD-10-CM | POA: Diagnosis not present

## 2024-01-05 DIAGNOSIS — C50212 Malignant neoplasm of upper-inner quadrant of left female breast: Secondary | ICD-10-CM | POA: Diagnosis not present

## 2024-01-05 DIAGNOSIS — Z171 Estrogen receptor negative status [ER-]: Secondary | ICD-10-CM | POA: Diagnosis not present

## 2024-01-05 NOTE — Progress Notes (Signed)
START ON PATHWAY REGIMEN - Breast     Cycles 1 through 4: A cycle is every 14 days:     Doxorubicin      Cyclophosphamide      [id number]    Cycles 5 through 16: A cycle is every 7 days:     Paclitaxel   **Always confirm dose/schedule in your pharmacy ordering system**  Patient Characteristics: Postoperative without Neoadjuvant Therapy, M0 (Pathologic Staging), Invasive Disease, Adjuvant Therapy, HER2 Negative, ER Negative, Node Negative, pT1a-c, N1mi or pT1c or Higher, pN0 Therapeutic Status: Postoperative without Neoadjuvant Therapy, M0 (Pathologic Staging) AJCC Grade: G3 AJCC N Category: pN0 AJCC M Category: cM0 ER Status: Negative (-) AJCC 8 Stage Grouping: IIA HER2 Status: Negative (-) Oncotype Dx Recurrence Score: Not Appropriate AJCC T Category: pT2 PR Status: Negative (-) Intent of Therapy: Curative Intent, Discussed with Patient 

## 2024-01-05 NOTE — Patient Instructions (Addendum)
 [facility] at [facility] Discharge Instructions   You were seen and examined today by Dr. Rogers.  He discussed with you a treatment plan that is given in two phases. The first phase is receiving 2 drugs every 2 weeks x 4. The drugs given are called Adriamycin  and Cytoxan . You will also receive a white blood cell booster shot two days after treatment. This is to prevent your white blood cell count from dropping too low and making you prone to infection.   The second phase of treatment is a weekly treatment with a drug called Taxol . This is given weekly in the clinc x 12 weeks.   We will plan for you to have an education session with our nurse educator who will go over the medications, side effects, and how to manage them in detail.   We will plan to start treatment in the next 1-2 weeks.   Return as scheduled.    Thank you for choosing [facility] at [facility] to provide your oncology and hematology care.  To afford each patient quality time with our provider, please arrive at least 15 minutes before your scheduled appointment time.   If you have a lab appointment with the Cancer Center please come in thru the Main Entrance and check in at the main information desk.  You need to re-schedule your appointment should you arrive 10 or more minutes late.  We strive to give you quality time with our providers, and arriving late affects you and other patients whose appointments are after yours.  Also, if you no show three or more times for appointments you may be dismissed from the clinic at the providers discretion.     Again, thank you for choosing [facility].  Our hope is that these requests will decrease the amount of time that you wait before being seen by our physicians.       _____________________________________________________________  Should you have questions after your visit to [facility], please contact our  office at [phone] and follow the prompts.  Our office hours are 8:00 a.m. and 4:30 p.m. Monday - Friday.  Please note that voicemails left after 4:00 p.m. may not be returned until the following business day.  We are closed weekends and major holidays.  You do have access to a nurse 24-7, just call the main number to the clinic [phone] and do not press any options, hold on the line and a nurse will answer the phone.    For prescription refill requests, have your pharmacy contact our office and allow 72 hours.    Due to Covid, you will need to wear a mask upon entering the hospital. If you do not have a mask, a mask will be given to you at the Main Entrance upon arrival. For doctor visits, patients may have 1 support person age 103 or older with them. For treatment visits, patients can not have anyone with them due to social distancing guidelines and our immunocompromised population.

## 2024-01-06 ENCOUNTER — Other Ambulatory Visit: Payer: Self-pay

## 2024-01-06 MED ORDER — LIDOCAINE-PRILOCAINE 2.5-2.5 % EX CREA: TOPICAL_CREAM | CUTANEOUS | 3 refills | Status: AC

## 2024-01-06 MED ORDER — PROCHLORPERAZINE MALEATE 10 MG PO TABS: 10.0000 mg | ORAL_TABLET | Freq: Four times a day (QID) | ORAL | 3 refills | Status: AC | PRN

## 2024-01-13 ENCOUNTER — Inpatient Hospital Stay: Payer: BC Managed Care – PPO | Admitting: Genetic Counselor

## 2024-01-13 ENCOUNTER — Encounter: Payer: Self-pay | Admitting: Genetic Counselor

## 2024-01-13 ENCOUNTER — Inpatient Hospital Stay: Payer: BC Managed Care – PPO

## 2024-01-13 DIAGNOSIS — Z803 Family history of malignant neoplasm of breast: Secondary | ICD-10-CM

## 2024-01-13 DIAGNOSIS — Z171 Estrogen receptor negative status [ER-]: Secondary | ICD-10-CM | POA: Diagnosis not present

## 2024-01-13 DIAGNOSIS — C50212 Malignant neoplasm of upper-inner quadrant of left female breast: Secondary | ICD-10-CM

## 2024-01-13 NOTE — Progress Notes (Signed)
REFERRING PROVIDER: Doreatha Massed, MD [address] [place],  [place] [address]  PRIMARY PROVIDER:  Kerri Perches, MD  PRIMARY REASON FOR VISIT:  1. Family history of breast cancer   2. Malignant neoplasm of upper-inner quadrant of left breast in female, estrogen receptor negative (HCC)      HISTORY OF PRESENT ILLNESS:   Meredith Sanchez, a 58 y.o. female, was seen for a [place] cancer genetics consultation at the request of Dr. Ellin Saba due to a personal and family history of breast cancer.  Meredith Sanchez presents to clinic today to discuss the possibility of a hereditary predisposition to cancer, genetic testing, and to further clarify her future cancer risks, as well as potential cancer risks for family members.   In December 2024, at the age of 89, Meredith Sanchez was diagnosed with Triple Negative breast cancer of the left breast. The treatment plan chemotherapy, surgery and radiation.Marland Kitchen   CANCER HISTORY:  Oncology History  Breast cancer of upper-inner quadrant of left female breast (HCC)  11/15/2023 Initial Diagnosis   Breast cancer of upper-inner quadrant of left female breast (HCC)    Genetic Testing   Likely pathogenic variant in BRIP1 called c.3196del (p.Ser1066Hisfs*12) identified on the [facility] Common Hereditary Cancers+RNA panel. VUS in CTNNA1 called c.1070G>A (p.Arg357His) identified. The report date is 11/29/2023.  The Common Hereditary Cancers Panel + RNA offered by [facility] includes sequencing and/or deletion duplication testing of the following 48 genes: APC*, ATM*, AXIN2, BAP1, BARD1, BMPR1A, BRCA1, BRCA2, BRIP1, CDH1, CDK4, CDKN2A (p14ARF), CDKN2A (p16INK4a), CHEK2, CTNNA1, DICER1*, EPCAM*, FH*, GREM1*, HOXB13, KIT, MBD4, MEN1*, MLH1*, MSH2*, MSH3*, MSH6*, MUTYH, NF1*, NTHL1, PALB2, PDGFRA, PMS2*, POLD1*, POLE, PTEN*, RAD51C, RAD51D, SDHA*, SDHB, SDHC*, SDHD, SMAD4, SMARCA4, STK11, TP53, TSC1*, TSC2, VHL.    11/15/2023 Cancer Staging   Staging form: Breast, AJCC 8th  Edition - Clinical stage from 11/15/2023: Stage IIB (cT2, cN0(sn), cM0, G3, ER-, PR-, HER2-) - Signed by Doreatha Massed, MD on 01/05/2024 Histopathologic type: Infiltrating duct carcinoma, NOS Stage prefix: Initial diagnosis Method of lymph node assessment: Sentinel lymph node biopsy Nuclear grade: G3 Histologic grading system: 3 grade system   01/19/2024 -  Chemotherapy   Patient is on Treatment Plan : BREAST DOSE DENSE AC q14d / PACLitaxel q7d        RISK FACTORS:  Menarche was at age 24.  First live birth at age 74.   Ovaries intact: one.  Hysterectomy: no.  Menopausal status: postmenopausal.  HRT use: 0 years. Colonoscopy: yes; normal. Mammogram within the last year: yes. Number of breast biopsies: 4.  Past Medical History:  Diagnosis Date   Allergic rhinitis    Family history of breast cancer    Hyperlipidemia    Hypertension    Myocardial infarction [facility])    Obesity     Past Surgical History:  Procedure Laterality Date   ABDOMINAL HYSTERECTOMY     APPENDECTOMY  11/30/2001   BREAST BIOPSY Left 11/02/2023   path pending   BREAST BIOPSY Left 11/02/2023   [place] LT BREAST BX W LOC DEV 1ST LESION IMG BX SPEC US GUIDE 11/02/2023 [facility]   BREAST BIOPSY Left 11/17/2023   MM LT BREAST BX W LOC DEV 1ST LESION IMAGE BX SPEC STEREO GUIDE 11/17/2023 [facility]   BREAST BIOPSY Left 11/17/2023   MM LT BREAST BX W LOC DEV EA AD LESION IMG BX SPEC STEREO GUIDE 11/17/2023 [facility]   COLONOSCOPY N/A 08/20/2014   Procedure: COLONOSCOPY;  Surgeon: West Bali, MD;  Location: [facility] SUITE;  Service: Endoscopy;  Laterality: N/A;  2:15pm   LEFT OOPHORECTOMY  11/30/2001   PORTACATH PLACEMENT Right 12/08/2023   Procedure: INSERTION SUBCLAVIAN PORT-A-CATH;  Surgeon: Lucretia Roers, MD;  Location: [facility] ORS;  Service: General;  Laterality: Right;   SIMPLE MASTECTOMY WITH AXILLARY SENTINEL NODE BIOPSY Left 12/08/2023   Procedure: SIMPLE MASTECTOMY WITH AXILLARY  SENTINEL NODE BIOPSY;  Surgeon: Lucretia Roers, MD;  Location: [facility] ORS;  Service: General;  Laterality: Left;   TUBAL LIGATION     VESICOVAGINAL FISTULA CLOSURE W/ TAH  11/30/2001    Social History   Socioeconomic History   Marital status: Married    Spouse name: Not on file   Number of children: 3   Years of education: Not on file   Highest education level: Not on file  Occupational History   Occupation: employed     Comment: [employer]  Tobacco Use   Smoking status: Never   Smokeless tobacco: Never  Vaping Use   Vaping status: Never Used  Substance and Sexual Activity   Alcohol use: No   Drug use: No   Sexual activity: Not on file  Other Topics Concern   Not on file  Social History Narrative   Not on file   Social Drivers of Health   Financial Resource Strain: Not on file  Food Insecurity: Patient Declined (12/08/2023)   Hunger Vital Sign    Worried About Running Out of Food in the Last Year: Patient declined    Ran Out of Food in the Last Year: Patient declined  Transportation Needs: Patient Declined (12/08/2023)   PRAPARE - Administrator, Civil Service (Medical): Patient declined    Lack of Transportation (Non-Medical): Patient declined  Physical Activity: Inactive (12/08/2018)   Received from [facility], [facility]   Exercise Vital Sign    Days of Exercise per Week: 0 days    Minutes of Exercise per Session: 0 min  Stress: Not on file  Social Connections: Not on file     FAMILY HISTORY:  We obtained a detailed, 4-generation family history.  Significant diagnoses are listed below: Family History  Problem Relation Age of Onset   Diabetes Mother    Hypertension Mother    Hypertension Father    Lung cancer Brother 45       metastatic lung cancer to brain   Breast cancer Cousin 74       pat first cousin   Colon cancer Neg Hx      The patient has three daughters who are cancer free.  She does not report a family history of cancer other  than her paternal first cousin with breast cancer.  Although she repeated over and over that her family does not talk about their medical ailments so she would probably not know about a cancer diagnosis.  Meredith Sanchez is unaware of previous family history of genetic testing for hereditary cancer risks. There is no reported Ashkenazi Jewish ancestry. There is no known consanguinity.  GENETIC COUNSELING ASSESSMENT: Meredith Sanchez is a 58 y.o. female with a personal and family history of breast cancer which is somewhat suggestive of a hereditary cancer syndrome and predisposition to cancer given her triple negative breast cancer. We, therefore, discussed and recommended the following at today's visit.   DISCUSSION: We discussed that, in general, most cancer is not inherited in families, but instead is sporadic or familial. Sporadic cancers occur by chance and typically happen at  older ages (>50 years) as this type of cancer is caused by genetic changes acquired during an individual's lifetime. Some families have more cancers than would be expected by chance; however, the ages or types of cancer are not consistent with a known genetic mutation or known genetic mutations have been ruled out. This type of familial cancer is thought to be due to a combination of multiple genetic, environmental, hormonal, and lifestyle factors. While this combination of factors likely increases the risk of cancer, the exact source of this risk is not currently identifiable or testable.  We discussed that 5 - 10% of breast cancer is hereditary, with most cases associated with BRCA mutations.  There are other genes that can be associated with hereditary breast cancer syndromes.  These include ATM, CHEK2 and PALB2.  We discussed that testing is beneficial for several reasons including knowing how to follow individuals after completing their treatment, identifying whether potential treatment options such as PARP inhibitors would be beneficial,  and understand if other family members could be at risk for cancer and allow them to undergo genetic testing.   We reviewed the characteristics, features and inheritance patterns of hereditary cancer syndromes. We also discussed genetic testing, including the appropriate family members to test, the process of testing, insurance coverage and turn-around-time for results. We discussed the implications of a negative, positive, carrier and/or variant of uncertain significant result.   Meredith Sanchez underwent genetic testing in December with results coming back 11/29/2023.  GENETIC TESTING:   Ms. Carandang tested positive for a single pathogenic variant in the BRCA1 gene. Specifically, this variant is c.3196del (p.Ser1066Hisfs*12).  The test report has been scanned into EPIC and is located under the Molecular Pathology section of the Results Review tab.  A portion of the result report is included below for reference. Genetic testing reported out on November 29, 2023.     Genetic testing did identify a variant of uncertain significance (VUS) was identified in the CTNNA1 gene called c.1070G>A (p.Arg357His).  At this time, it is unknown if this variant is associated with increased cancer risk or if this is a normal finding, but most variants such as this get reclassified to being inconsequential. It should not be used to make medical management decisions. With time, we suspect the lab will determine the significance of this variant, if any. If we do learn more about it, we will try to contact Meredith Sanchez to discuss it further. However, it is important to stay in touch with [place] periodically and keep the address and phone number up to date.  Clinical Information: BRIP1 pathogenic variants are characterized by an increased lifetime risk for, generally, adult-onset ovarian cancer.  At this time we do not feel that the risk for other cancers is increased.   The cancers associated with BRIP1 are:  Ovarian cancer, 5-15%  risk Not enough data suggest an increased risk for breast or other cancers.    Management Recommendations:  Gynecological Cancer Screening/Risk Reduction: It is recommended that women with a BRIP1 mutation consider having a risk-reducing salpingo oophorectomy (RRSO), removal of the ovaries and fallopian tubes, between the ages of 36-50 or once childbearing is completed. Having a RRSO is estimated to reduce the risk of ovarian cancer by up to 96%. There is still a small risk of developing an "ovarian-like" cancer in the lining of the abdomen, called the peritoneum. Women undergoing a RRSO should be aware of the potential risks and benefits of concurrent hysterectomy. Hormone replacement therapy could  be considered based on the physician's discretion. Individuals at risk for developing ovarian cancer may benefit from the use of medication to reduce their risk for cancer. These medications are referred to as chemoprevention. For example, oral contraceptive use has been shown to reduce the risk of ovarian cancer by approximately 60% in BRCA mutation carrier population if taken for at least 5 years. This risk reduction remains even after discontinuation of oral contraceptives. Ovarian cancer screening is an option for women who chose not to have a RRSO or who, as of yet, have not completed their family. Current screening methods for ovarian cancer are neither sensitive nor specific, meaning that often early-stage ovarian cancer cannot be diagnosed through this screening.  Screening can also be falsely positive with no cancer present. For this reason, RRSO is recommended over screening. If ovarian cancer screening is recommended by your physician, it could include: CA-125 blood tests Transvaginal ultrasounds Clinical pelvic exams   This information is based on current understanding of the gene and may change in the future.   Implications for Family Members: Hereditary predisposition to cancer due to  pathogenic variants in the BRIP1 gene has autosomal dominant inheritance. This means that an individual with a pathogenic variant has a 50% chance of passing the condition on to his/her offspring. Identification of a pathogenic variant allows for the recognition of at-risk relatives who can pursue testing for the familial variant. We recommend that Meredith Sanchez's daughters and siblings be offered genetic testing.  Family members are encouraged to consider genetic testing for this familial pathogenic variant. As there are generally no childhood cancer risks associated with pathogenic variants in the BRIP1 gene, individuals in the family are not recommended to have testing until they reach at least 59 years of age. They may contact our office at [phone] for more information or to schedule an appointment.  Complimentary testing for the familial variant is available for 150 days after the report date.  Family members who live outside of the area are encouraged to find a genetic counselor in their area by visiting: [email or web address].  We encouraged Meredith Sanchez to remain in contact with [place] on an annual basis so we can update her personal and family histories, and let her know of advances in cancer genetics that may benefit the family. Our contact number was provided. Meredith Sanchez questions were answered to her satisfaction today, and she knows she is welcome to call anytime with additional questions.   Calle Schader P. Lowell Guitar, MS, [facility] Licensed, Patent attorney [email or web address] phone: [phone]  60 minutes were spent on the date of the encounter in service to the patient including preparation, face-to-face consultation, documentation and care coordination.  The patient was seen alone.  Drs. Meliton Rattan, and/or [place] were available for questions, if needed..    _______________________________________________________________________ For Office Staff:  Number of  people involved in session: 1 Was an Intern/ student involved with case: no

## 2024-01-13 NOTE — Progress Notes (Signed)

## 2024-01-17 ENCOUNTER — Inpatient Hospital Stay: Payer: BC Managed Care – PPO | Admitting: Dietician

## 2024-01-17 ENCOUNTER — Telehealth: Payer: Self-pay | Admitting: Dietician

## 2024-01-17 NOTE — Telephone Encounter (Signed)
 Nutrition Assessment   Reason for Assessment: New Breast   ASSESSMENT: 58 year old female with left IDC, triple negative. S/p left mastectomy with SNLB (1/8) under the care of Dr. Henreitta Leber. Patient is planning adjuvant chemotherapy with AC x 4 cycles followed by weekly paclitaxel x 12 weeks (first planned 2/19). Patient is under the care of Dr. Ellin Saba.   Past medical history includes HTN, HLD, neuropathy  Received return call from patient. She is agreeable to telephone visit. Patient reports doing well overall. She has noticed decrease in energy recently. Patient is eating normally at this time. She typically eats 2 meals and snacks mid-day. Breakfast is typically cereal, milk and fruit. Occasionally will have oatmeal, sausage, and eggs. She recalls snacking on fresh fruit, chips during the day as breakfast is usually late. Patient recalls left over biscuit and gravy for dinner last night. Patient reports drinking mostly water, some zero pepsi and juices. She denies N/V/D/C   Nutrition Focused Physical Exam: deferred (telephone visit)   Medications: lipitor, colace, gabapentin, MVI, zofran, oxycodone, compazine, maxzide   Labs: 1/9 reviewed    Anthropometrics:   Height: 5'6" Weight: 182 lb 15.7 oz  UBW: 182-189 lb (last 12 months) BMI: 29.53    NUTRITION DIAGNOSIS: Food and nutrition related knowledge deficit related to cancer as evidenced by no prior need for associated nutrition information   INTERVENTION:  Educated on importance of adequate calorie and protein energy intake to preserve loss of LBM, maintaining strength/weights during treatment Educated on foods with protein, recommend lean protein source at every meal and snack Will mail handouts + contact information    MONITORING, EVALUATION, GOAL: Pt will tolerate adequate calories and protein to minimize wt loss during treatment    Next Visit: Thursday March 6 via telephone

## 2024-01-17 NOTE — Telephone Encounter (Signed)
 Patient scheduled for nutrition assessment via telephone.   Patient did not answer at scheduled time. Left VM with request for return call. Contact information provided.

## 2024-01-19 ENCOUNTER — Inpatient Hospital Stay: Payer: BC Managed Care – PPO

## 2024-01-19 ENCOUNTER — Inpatient Hospital Stay: Payer: BC Managed Care – PPO | Admitting: Licensed Clinical Social Worker

## 2024-01-19 VITALS — BP 119/68 | HR 86 | Temp 98.1°F | Resp 18 | Wt 186.6 lb

## 2024-01-19 VITALS — BP 122/78 | HR 97 | Temp 99.0°F | Resp 20 | Wt 186.3 lb

## 2024-01-19 DIAGNOSIS — C50212 Malignant neoplasm of upper-inner quadrant of left female breast: Secondary | ICD-10-CM | POA: Diagnosis not present

## 2024-01-19 DIAGNOSIS — G629 Polyneuropathy, unspecified: Secondary | ICD-10-CM | POA: Diagnosis not present

## 2024-01-19 DIAGNOSIS — E876 Hypokalemia: Secondary | ICD-10-CM

## 2024-01-19 DIAGNOSIS — Z5111 Encounter for antineoplastic chemotherapy: Secondary | ICD-10-CM | POA: Diagnosis not present

## 2024-01-19 DIAGNOSIS — Z171 Estrogen receptor negative status [ER-]: Secondary | ICD-10-CM | POA: Diagnosis not present

## 2024-01-19 DIAGNOSIS — Z95828 Presence of other vascular implants and grafts: Secondary | ICD-10-CM

## 2024-01-19 LAB — COMPREHENSIVE METABOLIC PANEL
ALT: 24 U/L (ref 0–44)
AST: 28 U/L (ref 15–41)
Albumin: 4.3 g/dL (ref 3.5–5.0)
Alkaline Phosphatase: 50 U/L (ref 38–126)
Anion gap: 14 (ref 5–15)
BUN: 30 mg/dL — ABNORMAL HIGH (ref 6–20)
CO2: 22 mmol/L (ref 22–32)
Calcium: 9.4 mg/dL (ref 8.9–10.3)
Chloride: 102 mmol/L (ref 98–111)
Creatinine, Ser: 1.11 mg/dL — ABNORMAL HIGH (ref 0.44–1.00)
GFR, Estimated: 58 mL/min — ABNORMAL LOW (ref >60)
Glucose, Bld: 178 mg/dL — ABNORMAL HIGH (ref 70–99)
Potassium: 3.1 mmol/L — ABNORMAL LOW (ref 3.5–5.1)
Sodium: 138 mmol/L (ref 135–145)
Total Bilirubin: 1.2 mg/dL (ref 0.0–1.2)
Total Protein: 7.6 g/dL (ref 6.5–8.1)

## 2024-01-19 LAB — CBC WITH DIFFERENTIAL/PLATELET
Abs Immature Granulocytes: 0.02 10*3/uL (ref 0.00–0.07)
Basophils Absolute: 0 10*3/uL (ref 0.0–0.1)
Basophils Relative: 1 %
Eosinophils Absolute: 0.2 10*3/uL (ref 0.0–0.5)
Eosinophils Relative: 4 %
HCT: 37.1 % (ref 36.0–46.0)
Hemoglobin: 12.4 g/dL (ref 12.0–15.0)
Immature Granulocytes: 0 %
Lymphocytes Relative: 28 %
Lymphs Abs: 1.6 10*3/uL (ref 0.7–4.0)
MCH: 31.6 pg (ref 26.0–34.0)
MCHC: 33.4 g/dL (ref 30.0–36.0)
MCV: 94.4 fL (ref 80.0–100.0)
Monocytes Absolute: 0.5 10*3/uL (ref 0.1–1.0)
Monocytes Relative: 9 %
Neutro Abs: 3.4 10*3/uL (ref 1.7–7.7)
Neutrophils Relative %: 58 %
Platelets: 322 10*3/uL (ref 150–400)
RBC: 3.93 MIL/uL (ref 3.87–5.11)
RDW: 13.2 % (ref 11.5–15.5)
WBC: 5.8 10*3/uL (ref 4.0–10.5)
nRBC: 0 % (ref 0.0–0.2)

## 2024-01-19 LAB — MAGNESIUM: Magnesium: 1.8 mg/dL (ref 1.7–2.4)

## 2024-01-19 MED ORDER — SODIUM CHLORIDE 0.9% FLUSH
10.0000 mL | INTRAVENOUS | Status: DC | PRN
  Administered 2024-01-19: 10 mL

## 2024-01-19 MED ORDER — POTASSIUM CHLORIDE CRYS ER 20 MEQ PO TBCR
40.0000 meq | EXTENDED_RELEASE_TABLET | Freq: Once | ORAL | Status: AC
  Administered 2024-01-19: 40 meq via ORAL
  Filled 2024-01-19: qty 2

## 2024-01-19 MED ORDER — SODIUM CHLORIDE 0.9 % IV SOLN
600.0000 mg/m2 | Freq: Once | INTRAVENOUS | Status: AC
  Administered 2024-01-19: 1180 mg via INTRAVENOUS
  Filled 2024-01-19: qty 59

## 2024-01-19 MED ORDER — SODIUM CHLORIDE 0.9 % IV SOLN
150.0000 mg | Freq: Once | INTRAVENOUS | Status: AC
  Administered 2024-01-19: 150 mg via INTRAVENOUS
  Filled 2024-01-19: qty 5

## 2024-01-19 MED ORDER — SODIUM CHLORIDE FLUSH 0.9 % IV SOLN
10.0000 mL | Freq: Once | INTRAVENOUS | Status: AC
  Administered 2024-01-19: 10 mL via INTRAVENOUS
  Filled 2024-01-19: qty 10

## 2024-01-19 MED ORDER — DOXORUBICIN HCL CHEMO IV INJECTION 2 MG/ML
60.0000 mg/m2 | Freq: Once | INTRAVENOUS | Status: AC
  Administered 2024-01-19: 118 mg via INTRAVENOUS
  Filled 2024-01-19: qty 59

## 2024-01-19 MED ORDER — SODIUM CHLORIDE 0.9 % IV SOLN: INTRAVENOUS | Status: DC

## 2024-01-19 MED ORDER — HEPARIN SOD (PORK) LOCK FLUSH 100 UNIT/ML IV SOLN
500.0000 [IU] | Freq: Once | INTRAVENOUS | Status: AC | PRN
Start: 2024-01-19 — End: 2024-01-19
  Administered 2024-01-19: 500 [IU]

## 2024-01-19 MED ORDER — DEXAMETHASONE SODIUM PHOSPHATE 10 MG/ML IJ SOLN
10.0000 mg | Freq: Once | INTRAMUSCULAR | Status: AC
  Administered 2024-01-19: 10 mg via INTRAVENOUS
  Filled 2024-01-19: qty 1

## 2024-01-19 MED ORDER — PALONOSETRON HCL INJECTION 0.25 MG/5ML
0.2500 mg | Freq: Once | INTRAVENOUS | Status: AC
  Administered 2024-01-19: 0.25 mg via INTRAVENOUS
  Filled 2024-01-19: qty 5

## 2024-01-19 NOTE — Patient Instructions (Signed)
 [facility] [place] - A DEPT [facility]  Discharge Instructions: Thank you for choosing [facility] to provide your oncology and hematology care.  If you have a lab appointment with the Cancer Center - please note that after April 8th, 2024, all labs will be drawn in the cancer center.  You do not have to check in or register with the main entrance as you have in the past but will complete your check-in in the cancer center.  Wear comfortable clothing and clothing appropriate for easy access to any Portacath or PICC line.   We strive to give you quality time with your provider. You may need to reschedule your appointment if you arrive late (15 or more minutes).  Arriving late affects you and other patients whose appointments are after yours.  Also, if you miss three or more appointments without notifying the office, you may be dismissed from the clinic at the provider's discretion.      For prescription refill requests, have your pharmacy contact our office and allow 72 hours for refills to be completed.    Today you received the following chemotherapy and/or immunotherapy agents Adriamycin/Cytoxan.  Doxorubicin Injection What is this medication? DOXORUBICIN (dox oh ROO bi sin) treats some types of cancer. It works by slowing down the growth of cancer cells. This medicine may be used for other purposes; ask your health care provider or pharmacist if you have questions. COMMON BRAND NAME(S): Adriamycin, Adriamycin PFS, Adriamycin RDF, Rubex What should I tell my care team before I take this medication? They need to know if you have any of these conditions: Heart disease History of low blood cell levels caused by a medication Liver disease Recent or ongoing radiation An unusual or allergic reaction to doxorubicin, other medications, foods, dyes, or preservatives If you or your partner are pregnant or trying to get pregnant Breast-feeding How should I  use this medication? This medication is injected into a vein. It is given by your care team in a hospital or clinic setting. Talk to your care team about the use of this medication in children. Special care may be needed. Overdosage: If you think you have taken too much of this medicine contact a poison control center or emergency room at once. NOTE: This medicine is only for you. Do not share this medicine with others. What if I miss a dose? Keep appointments for follow-up doses. It is important not to miss your dose. Call your care team if you are unable to keep an appointment. What may interact with this medication? 6-mercaptopurine Paclitaxel Phenytoin St. John's wort Trastuzumab Verapamil This list may not describe all possible interactions. Give your health care provider a list of all the medicines, herbs, non-prescription drugs, or dietary supplements you use. Also tell them if you smoke, drink alcohol, or use illegal drugs. Some items may interact with your medicine. What should I watch for while using this medication? Your condition will be monitored carefully while you are receiving this medication. You may need blood work while taking this medication. This medication may make you feel generally unwell. This is not uncommon as chemotherapy can affect healthy cells as well as cancer cells. Report any side effects. Continue your course of treatment even though you feel ill unless your care team tells you to stop. There is a maximum amount of this medication you should receive throughout your life. The amount depends on the medical condition being treated and  your overall health. Your care team will watch how much of this medication you receive. Tell your care team if you have taken this medication before. Your urine may turn red for a few days after your dose. This is not blood. If your urine is dark or Magaline Steinberg, call your care team. In some cases, you may be given additional medications to  help with side effects. Follow all directions for their use. This medication may increase your risk of getting an infection. Call your care team for advice if you get a fever, chills, sore throat, or other symptoms of a cold or flu. Do not treat yourself. Try to avoid being around people who are sick. This medication may increase your risk to bruise or bleed. Call your care team if you notice any unusual bleeding. Talk to your care team about your risk of cancer. You may be more at risk for certain types of cancers if you take this medication. Talk to your care team if you or your partner may be pregnant. Serious birth defects can occur if you take this medication during pregnancy and for 6 months after the last dose. Contraception is recommended while taking this medication and for 6 months after the last dose. Your care team can help you find the option that works for you. If your partner can get pregnant, use a condom while taking this medication and for 6 months after the last dose. Do not breastfeed while taking this medication. This medication may cause infertility. Talk to your care team if you are concerned about your fertility. What side effects may I notice from receiving this medication? Side effects that you should report to your care team as soon as possible: Allergic reactions--skin rash, itching, hives, swelling of the face, lips, tongue, or throat Heart failure--shortness of breath, swelling of the ankles, feet, or hands, sudden weight gain, unusual weakness or fatigue Heart rhythm changes--fast or irregular heartbeat, dizziness, feeling faint or lightheaded, chest pain, trouble breathing Infection--fever, chills, cough, sore throat, wounds that don't heal, pain or trouble when passing urine, general feeling of discomfort or being unwell Low red blood cell level--unusual weakness or fatigue, dizziness, headache, trouble breathing Painful swelling, warmth, or redness of the skin,  blisters or sores at the infusion site Unusual bruising or bleeding Side effects that usually do not require medical attention (report to your care team if they continue or are bothersome): Diarrhea Hair loss Nausea Pain, redness, or swelling with sores inside the mouth or throat Red urine This list may not describe all possible side effects. Call your doctor for medical advice about side effects. You may report side effects to FDA at 1-800-FDA-1088. Where should I keep my medication? This medication is given in a hospital or clinic. It will not be stored at home. NOTE: This sheet is a summary. It may not cover all possible information. If you have questions about this medicine, talk to your doctor, pharmacist, or health care provider.  2024 Elsevier/Gold Standard (2023-02-18 00:00:00)   Cyclophosphamide Injection What is this medication? CYCLOPHOSPHAMIDE (sye kloe FOSS fa mide) treats some types of cancer. It works by slowing down the growth of cancer cells. This medicine may be used for other purposes; ask your health care provider or pharmacist if you have questions. COMMON BRAND NAME(S): Cyclophosphamide, Cytoxan, Neosar What should I tell my care team before I take this medication? They need to know if you have any of these conditions: Heart disease Irregular heartbeat or rhythm Infection  Kidney problems Liver disease Low blood cell levels (white cells, platelets, or red blood cells) Lung disease Previous radiation Trouble passing urine An unusual or allergic reaction to cyclophosphamide, other medications, foods, dyes, or preservatives Pregnant or trying to get pregnant Breast-feeding How should I use this medication? This medication is injected into a vein. It is given by your care team in a hospital or clinic setting. Talk to your care team about the use of this medication in children. Special care may be needed. Overdosage: If you think you have taken too much of this  medicine contact a poison control center or emergency room at once. NOTE: This medicine is only for you. Do not share this medicine with others. What if I miss a dose? Keep appointments for follow-up doses. It is important not to miss your dose. Call your care team if you are unable to keep an appointment. What may interact with this medication? Amphotericin B Amiodarone Azathioprine Certain antivirals for HIV or hepatitis Certain medications for blood pressure, such as enalapril, lisinopril, quinapril Cyclosporine Diuretics Etanercept Indomethacin Medications that relax muscles Metronidazole Natalizumab Tamoxifen Warfarin This list may not describe all possible interactions. Give your health care provider a list of all the medicines, herbs, non-prescription drugs, or dietary supplements you use. Also tell them if you smoke, drink alcohol, or use illegal drugs. Some items may interact with your medicine. What should I watch for while using this medication? This medication may make you feel generally unwell. This is not uncommon as chemotherapy can affect healthy cells as well as cancer cells. Report any side effects. Continue your course of treatment even though you feel ill unless your care team tells you to stop. You may need blood work while you are taking this medication. This medication may increase your risk of getting an infection. Call your care team for advice if you get a fever, chills, sore throat, or other symptoms of a cold or flu. Do not treat yourself. Try to avoid being around people who are sick. Avoid taking medications that contain aspirin, acetaminophen, ibuprofen, naproxen, or ketoprofen unless instructed by your care team. These medications may hide a fever. Be careful brushing or flossing your teeth or using a toothpick because you may get an infection or bleed more easily. If you have any dental work done, tell your dentist you are receiving this medication. Drink  water or other fluids as directed. Urinate often, even at night. Some products may contain alcohol. Ask your care team if this medication contains alcohol. Be sure to tell all care teams you are taking this medicine. Certain medicines, like metronidazole and disulfiram, can cause an unpleasant reaction when taken with alcohol. The reaction includes flushing, headache, nausea, vomiting, sweating, and increased thirst. The reaction can last from 30 minutes to several hours. Talk to your care team if you wish to become pregnant or think you might be pregnant. This medication can cause serious birth defects if taken during pregnancy and for 1 year after the last dose. A negative pregnancy test is required before starting this medication. A reliable form of contraception is recommended while taking this medication and for 1 year after the last dose. Talk to your care team about reliable forms of contraception. Do not father a child while taking this medication and for 4 months after the last dose. Use a condom during this time period. Do not breast-feed while taking this medication or for 1 week after the last dose. This medication may cause  infertility. Talk to your care team if you are concerned about your fertility. Talk to your care team about your risk of cancer. You may be more at risk for certain types of cancer if you take this medication. What side effects may I notice from receiving this medication? Side effects that you should report to your care team as soon as possible: Allergic reactions--skin rash, itching, hives, swelling of the face, lips, tongue, or throat Dry cough, shortness of breath or trouble breathing Heart failure--shortness of breath, swelling of the ankles, feet, or hands, sudden weight gain, unusual weakness or fatigue Heart muscle inflammation--unusual weakness or fatigue, shortness of breath, chest pain, fast or irregular heartbeat, dizziness, swelling of the ankles, feet, or  hands Heart rhythm changes--fast or irregular heartbeat, dizziness, feeling faint or lightheaded, chest pain, trouble breathing Infection--fever, chills, cough, sore throat, wounds that don't heal, pain or trouble when passing urine, general feeling of discomfort or being unwell Kidney injury--decrease in the amount of urine, swelling of the ankles, hands, or feet Liver injury--right upper belly pain, loss of appetite, nausea, light-colored stool, dark yellow or Janathan Bribiesca urine, yellowing skin or eyes, unusual weakness or fatigue Low red blood cell level--unusual weakness or fatigue, dizziness, headache, trouble breathing Low sodium level--muscle weakness, fatigue, dizziness, headache, confusion Red or dark Kajuan Guyton urine Unusual bruising or bleeding Side effects that usually do not require medical attention (report to your care team if they continue or are bothersome): Hair loss Irregular menstrual cycles or spotting Loss of appetite Nausea Pain, redness, or swelling with sores inside the mouth or throat Vomiting This list may not describe all possible side effects. Call your doctor for medical advice about side effects. You may report side effects to FDA at 1-800-FDA-1088. Where should I keep my medication? This medication is given in a hospital or clinic. It will not be stored at home. NOTE: This sheet is a summary. It may not cover all possible information. If you have questions about this medicine, talk to your doctor, pharmacist, or health care provider.  2024 Elsevier/Gold Standard (2022-04-03 00:00:00)       To help prevent nausea and vomiting after your treatment, we encourage you to take your nausea medication as directed.  BELOW ARE SYMPTOMS THAT SHOULD BE REPORTED IMMEDIATELY: *FEVER GREATER THAN 100.4 F (38 C) OR HIGHER *CHILLS OR SWEATING *NAUSEA AND VOMITING THAT IS NOT CONTROLLED WITH YOUR NAUSEA MEDICATION *UNUSUAL SHORTNESS OF BREATH *UNUSUAL BRUISING OR BLEEDING *URINARY  PROBLEMS (pain or burning when urinating, or frequent urination) *BOWEL PROBLEMS (unusual diarrhea, constipation, pain near the anus) TENDERNESS IN MOUTH AND THROAT WITH OR WITHOUT PRESENCE OF ULCERS (sore throat, sores in mouth, or a toothache) UNUSUAL RASH, SWELLING OR PAIN  UNUSUAL VAGINAL DISCHARGE OR ITCHING   Items with * indicate a potential emergency and should be followed up as soon as possible or go to the Emergency Department if any problems should occur.  Please show the CHEMOTHERAPY ALERT CARD or IMMUNOTHERAPY ALERT CARD at check-in to the Emergency Department and triage nurse.  Should you have questions after your visit or need to cancel or reschedule your appointment, please contact [facility] [place] - A DEPT OF Eligha Brideg[facility] [phone]  and follow the prompts.  Office hours are 8:00 a.m. to 4:30 p.m. Monday - Friday. Please note that voicemails left after 4:00 p.m. may not be returned until the following business day.  We are closed weekends and major holidays. You have access to  a nurse at all times for urgent questions. Please call the main number to the clinic [phone] and follow the prompts.  For any non-urgent questions, you may also contact your provider using MyChart. We now offer e-Visits for anyone 76 and older to request care online for non-urgent symptoms. For details visit [email or web address].   Also download the MyChart app! Go to the app store, search "MyChart", open the app, select [place], and log in with your MyChart username and password.

## 2024-01-19 NOTE — Progress Notes (Signed)
 Pharmacist Chemotherapy Monitoring - Initial Assessment    Anticipated start date: 01/19/24   The following has been reviewed per standard work regarding the patient's treatment regimen: The patient's diagnosis, treatment plan and drug doses, and organ/hematologic function Lab orders and baseline tests specific to treatment regimen  The treatment plan start date, drug sequencing, and pre-medications Prior authorization status  Patient's documented medication list, including drug-drug interaction screen and prescriptions for anti-emetics and supportive care specific to the treatment regimen The drug concentrations, fluid compatibility, administration routes, and timing of the medications to be used The patient's access for treatment and lifetime cumulative dose history, if applicable  The patient's medication allergies and previous infusion related reactions, if applicable   Changes made to treatment plan:  N/A  Follow up needed:  N/A   Stephens Shire, RPH, 01/19/2024  9:00 AM

## 2024-01-19 NOTE — Progress Notes (Signed)
 Patient presents today for chemotherapy infusion.  Patient is in satisfactory condition with no new complaints voiced.  Vital signs are stable.  Labs reviewed and all labs are within treatment parameters.  Potassium today is 3.1.  We will give Klor Con 40 mEq PO x one dose today per standing orders by Dr. Ellin Saba. We will proceed with treatment per MD orders.    Patient tolerated treatment well with no complaints voiced.  Patient left ambulatory with sister in stable condition.  Vital signs stable at discharge.  Follow up as scheduled.

## 2024-01-19 NOTE — Progress Notes (Signed)
 [facility] Clinical Social Work  Initial Assessment   Meredith Sanchez is a 58 y.o. year old female contacted by phone. Clinical Social Work was referred by medical provider for assessment of psychosocial needs.   SDOH (Social Determinants of Health) assessments performed: Yes   SDOH Screenings   Food Insecurity: Patient Declined (12/08/2023)  Housing: Unknown (12/08/2023)  Transportation Needs: Patient Declined (12/08/2023)  Utilities: Patient Declined (12/08/2023)  Depression (PHQ2-9): Low Risk  (09/10/2023)  Physical Activity: Inactive (12/08/2018)   Received from [facility]  Tobacco Use: Low Risk  (12/30/2023)     Distress Screen completed: No     No data to display            Family/Social Information:  Housing Arrangement: patient lives alone Family members/support persons in your life? Pt has 3 daughters who reside locally and are available to stay w/ pt as needed while she undergoes treatment. Transportation concerns: no  Employment: Unemployed pt has been unable to work since January 8th and has not been with her current employer for the required year in order to be eligible for short term disability.  Income source: No income Financial concerns: Yes, current concerns Type of concern: Utilities and Rent/ mortgage Food access concerns: no Religious or spiritual practice: Not known Advanced directives: Not known Services Currently in place:  none  Coping/ Adjustment to diagnosis: Patient understands treatment plan and what happens next? yes Concerns about diagnosis and/or treatment: Losing my job and/or losing income and Overwhelmed by information Patient reported stressors: Finances, Anxiety/ nervousness, and Adjusting to my illness Hopes and/or priorities: pt's priority is to continue treatment w/ the hope of positive results Patient enjoys  not addressed Current coping skills/ strengths: Capable of independent living , Motivation for treatment/growth ,  and Physical Health     SUMMARY: Current SDOH Barriers:  Financial constraints related to loss of income  Clinical Social Work Clinical Goal(s):  Explore community resource options for unmet needs related to:  Financial Strain   Interventions: Discussed common feeling and emotions when being diagnosed with cancer, and the importance of support during treatment Informed patient of the support team roles and support services at [facility] Provided CSW contact information and encouraged patient to call with any questions or concerns Referred patient to [employer].  Informed of [employer].  Pt will bring in proof of income and award letter for food stamps at her next appointment in order to apply for the [employer].  Pt encouraged to apply for SSI as she is not eligible for short term disability and has no income at this time.  CSW emailed pt links for the [employer] to apply for additional financial assistance.     Follow Up Plan: Patient will contact CSW with any support or resource needs Patient verbalizes understanding of plan: Yes    Rachel Moulds, LCSW Clinical Social Worker [facility]  Patient is participating in a Managed Medicaid Plan:  Yes

## 2024-01-20 NOTE — Progress Notes (Signed)
 24 hour call back: Cytoxan , Adriamycin D1C1. Patient states, " I feel fine". Patient able to eat and drink with no complaints of nausea or vomiting. Patient denies any side effects related to treatment. Patient instructed to call the clinic with any concerns or unresolved side effects. Understanding verbalized.

## 2024-01-21 ENCOUNTER — Inpatient Hospital Stay: Payer: BC Managed Care – PPO

## 2024-01-21 VITALS — BP 135/78 | HR 76 | Temp 97.7°F | Resp 18

## 2024-01-21 DIAGNOSIS — Z171 Estrogen receptor negative status [ER-]: Secondary | ICD-10-CM

## 2024-01-21 DIAGNOSIS — G629 Polyneuropathy, unspecified: Secondary | ICD-10-CM | POA: Diagnosis not present

## 2024-01-21 DIAGNOSIS — Z5111 Encounter for antineoplastic chemotherapy: Secondary | ICD-10-CM | POA: Diagnosis not present

## 2024-01-21 DIAGNOSIS — C50212 Malignant neoplasm of upper-inner quadrant of left female breast: Secondary | ICD-10-CM | POA: Diagnosis not present

## 2024-01-21 MED ORDER — PEGFILGRASTIM-JMDB 6 MG/0.6ML ~~LOC~~ SOSY
6.0000 mg | PREFILLED_SYRINGE | Freq: Once | SUBCUTANEOUS | Status: AC
  Administered 2024-01-21: 6 mg via SUBCUTANEOUS
  Filled 2024-01-21: qty 0.6

## 2024-01-21 NOTE — Patient Instructions (Signed)
 [facility] [place] [facility]  Discharge Instructions: Thank you for choosing [facility] to provide your oncology and hematology care.  If you have a lab appointment with the Cancer Center - please note that after April 8th, 2024, all labs will be drawn in the cancer center.  You do not have to check in or register with the main entrance as you have in the past but will complete your check-in in the cancer center.  Wear comfortable clothing and clothing appropriate for easy access to any Portacath or PICC line.   We strive to give you quality time with your provider. You may need to reschedule your appointment if you arrive late (15 or more minutes).  Arriving late affects you and other patients whose appointments are after yours.  Also, if you miss three or more appointments without notifying the office, you may be dismissed from the clinic at the provider's discretion.      For prescription refill requests, have your pharmacy contact our office and allow 72 hours for refills to be completed.    Today you received the following chemotherapy and/or immunotherapy agents fulphila      To help prevent nausea and vomiting after your treatment, we encourage you to take your nausea medication as directed.  BELOW ARE SYMPTOMS THAT SHOULD BE REPORTED IMMEDIATELY: *FEVER GREATER THAN 100.4 F (38 C) OR HIGHER *CHILLS OR SWEATING *NAUSEA AND VOMITING THAT IS NOT CONTROLLED WITH YOUR NAUSEA MEDICATION *UNUSUAL SHORTNESS OF BREATH *UNUSUAL BRUISING OR BLEEDING *URINARY PROBLEMS (pain or burning when urinating, or frequent urination) *BOWEL PROBLEMS (unusual diarrhea, constipation, pain near the anus) TENDERNESS IN MOUTH AND THROAT WITH OR WITHOUT PRESENCE OF ULCERS (sore throat, sores in mouth, or a toothache) UNUSUAL RASH, SWELLING OR PAIN  UNUSUAL VAGINAL DISCHARGE OR ITCHING   Items with * indicate a potential emergency and should be followed up  as soon as possible or go to the Emergency Department if any problems should occur.  Please show the CHEMOTHERAPY ALERT CARD or IMMUNOTHERAPY ALERT CARD at check-in to the Emergency Department and triage nurse.  Should you have questions after your visit or need to cancel or reschedule your appointment, please contact [facility] [place] - A DEPT OF [facility] Brideg[facility] [phone]  and follow the prompts.  Office hours are 8:00 a.m. to 4:30 p.m. Monday - Friday. Please note that voicemails left after 4:00 p.m. may not be returned until the following business day.  We are closed weekends and major holidays. You have access to a nurse at all times for urgent questions. Please call the main number to the clinic [phone] and follow the prompts.  For any non-urgent questions, you may also contact your provider using MyChart. We now offer e-Visits for anyone 2 and older to request care online for non-urgent symptoms. For details visit [email or web address].   Also download the MyChart app! Go to the app store, search "MyChart", open the app, select [place], and log in with your MyChart username and password.

## 2024-01-21 NOTE — Progress Notes (Signed)
Patient presents today for Fulphila injection per providers order.  Vital signs WNL.  Patient has no new complaints at this time.  Stable during administration without incident; injection site WNL; see MAR for injection details.  Patient tolerated procedure well and without incident.  No questions or complaints noted at this time.  

## 2024-01-27 ENCOUNTER — Telehealth: Payer: Self-pay | Admitting: Hematology

## 2024-01-27 NOTE — Telephone Encounter (Signed)
 Received proof of FNS from Ms Cobbins, however, I did not receive a signed Pt Assist form. I will contact pt and have her sign the next time she has tx.

## 2024-02-01 NOTE — Progress Notes (Signed)
 [facility] [address], [place] [address]   Clinic Day:  02/02/2024  Referring physician: Kerri Perches, MD  Patient Care Team: Kerri Perches, MD as PCP - General   ASSESSMENT & PLAN:   Assessment:  1.  T1 cN0 G3 ER/PR/HER2-left breast IDC: - Screening mammogram on 10/20/2022: Abnormal - Left breast diagnostic mammogram/ultrasound (10/26/2023): Linear oriented pleomorphic calcifications in the lower inner left breast span 5.9 cm.  There has been interval development of irregular mass associated with these calcifications in the lower inner left breast.  Ultrasound of the left breast shows irregular hypoechoic mass in the left breast at 9 o'clock position measuring 2 x 1.3 x 1.6 cm.  No abnormal lymph nodes in the left axilla. - Left breast 9:00 biopsy (11/02/2023): Poorly differentiated IDC, grade 3, ER/PR negative, HER2 (1+) by IHC.  Ki-67: 20%. - Left breast biopsy LIQ posterior extent and anterior extent (11/17/2023): DCIS, comedo, high nuclear grade. - PET scan (11/25/2023): No evidence of metastatic disease. - Left mastectomy and SLNB on 12/08/2023 by Dr. Henreitta Leber - Pathology: 3.9 x 2.2 x 1.5 cm invasive poorly differentiated ductal adenocarcinoma, grade 3 with extensive necrosis, high-grade DCIS with necrosis, margins free, negative angiolymphatic invasion.  0/6 lymph nodes involved.  pT2 pN0. - Adjuvant chemotherapy with dose dense AC x 4 followed by weekly paclitaxel x 12 was recommended. - Cycle 1 of dose dense AC on 01/19/2024.  2. Social/Family History: -Lives at home with her daughter. Works full-time with mild physical activity at work. No tobacco use.  -Brother died of lung cancer in his early 1's with a history of tobacco use. No other family history of cancer.  3. BRIP1 heterozygosity: - We reviewed genetic testing results which was positive for BRIP1 mutation, putting her at high risk for ovarian cancer.  Also at high risk for Fanconi  anemia. - She had unilateral salpingo-oophorectomy more than 10 years ago.  I have recommended salpingo-oophorectomy of the remaining ovary.   also recommended that her daughters be tested for the same mutation.  Plan:  1.  T1 cN0 G3 triple negative left breast IDC: - She received first cycle of dose dense AC on 01/19/2024. - She reported nausea after receiving G-CSF injection which lasted about 2 to 3 days but denied any vomiting.  She also had bodyaches which lasted about 2 days.  She took Tylenol for it.  Compazine helped for the nausea. - Reviewed labs today: ALT minimally elevated at 45 and rest of LFTs are normal.  Creatinine 1.04.  CBC grossly normal.  Proceed with cycle 2 today.  RTC 2 weeks for follow-up.  Will plan to change G-CSF to Neulasta on pro.  2.  Peripheral neuropathy: - Tingling and occasional burning sensation in the toes at nighttime for the past 5 years he is stable.  She is prediabetic.  3.  High risk drug monitoring: - 2D echo (11/23/2023): LVEF 55%.  Grade 1 diastolic dysfunction.  Denies any signs and symptoms of PND or orthopnea.  4.  Hypomagnesemia: - Magnesium is low today at 1.5. - She denies any diarrhea.  She will receive IV magnesium today.  Will start her on magnesium oxide once daily.   Orders Placed This Encounter  Procedures   Magnesium    Standing Status:   Future    Expected Date:   03/15/2024    Expiration Date:   03/15/2025   CBC with Differential    Standing Status:  Future    Expected Date:   03/15/2024    Expiration Date:   03/15/2025   Comprehensive metabolic panel    Standing Status:   Future    Expected Date:   03/15/2024    Expiration Date:   03/15/2025      Mikeal Hawthorne R Teague,acting as a scribe for Doreatha Massed, MD.,have documented all relevant documentation on the behalf of Doreatha Massed, MD,as directed by  Doreatha Massed, MD while in the presence of Doreatha Massed, MD.  I, Doreatha Massed MD, have  reviewed the above documentation for accuracy and completeness, and I agree with the above.      Doreatha Massed, MD   3/5/20251:08 PM  CHIEF COMPLAINT/PURPOSE OF CONSULT:   Diagnosis: Triple negative breast cancer of upper-inner quadrant of left female breast   Cancer Staging  Breast cancer of upper-inner quadrant of left female breast [facility]) Staging form: Breast, AJCC 8th Edition - Clinical stage from 11/15/2023: Stage IIB (cT2, cN0(sn), cM0, G3, ER-, PR-, HER2-) - Signed by Doreatha Massed, MD on 01/05/2024    Prior Therapy: Left mastectomy and SLNB  Current Therapy: Dose dense AC followed by weekly paclitaxel   HISTORY OF PRESENT ILLNESS:   Oncology History  Breast cancer of upper-inner quadrant of left female breast (HCC)  11/15/2023 Initial Diagnosis   Breast cancer of upper-inner quadrant of left female breast (HCC)    Genetic Testing   Likely pathogenic variant in BRIP1 called c.3196del (p.Ser1066Hisfs*12) identified on the [facility] Common Hereditary Cancers+RNA panel. VUS in CTNNA1 called c.1070G>A (p.Arg357His) identified. The report date is 11/29/2023.  The Common Hereditary Cancers Panel + RNA offered by [facility] includes sequencing and/or deletion duplication testing of the following 48 genes: APC*, ATM*, AXIN2, BAP1, BARD1, BMPR1A, BRCA1, BRCA2, BRIP1, CDH1, CDK4, CDKN2A (p14ARF), CDKN2A (p16INK4a), CHEK2, CTNNA1, DICER1*, EPCAM*, FH*, GREM1*, HOXB13, KIT, MBD4, MEN1*, MLH1*, MSH2*, MSH3*, MSH6*, MUTYH, NF1*, NTHL1, PALB2, PDGFRA, PMS2*, POLD1*, POLE, PTEN*, RAD51C, RAD51D, SDHA*, SDHB, SDHC*, SDHD, SMAD4, SMARCA4, STK11, TP53, TSC1*, TSC2, VHL.    11/15/2023 Cancer Staging   Staging form: Breast, AJCC 8th Edition - Clinical stage from 11/15/2023: Stage IIB (cT2, cN0(sn), cM0, G3, ER-, PR-, HER2-) - Signed by Doreatha Massed, MD on 01/05/2024 Histopathologic type: Infiltrating duct carcinoma, NOS Stage prefix: Initial diagnosis Method of lymph node  assessment: Sentinel lymph node biopsy Nuclear grade: G3 Histologic grading system: 3 grade system   01/19/2024 -  Chemotherapy   Patient is on Treatment Plan : BREAST DOSE DENSE AC q14d / PACLitaxel q7d         Kelia is a 58 y.o. female presenting to clinic today for evaluation of left breast cancer at the request of Kerri Perches, MD.  Patient underwent a US of the left breast and diagnostic bilateral MM on 10/26/23 after her last visit with her PCP that found: a highly suspicious 2 cm mass in the left breast at the 9 o'clock position with associated linear oriented calcifications, the mass with calcifications span 5.9 cm. She then had a left breast biopsy on 11/02/23 with pathology revealing: invasive poorly differentiated ductal adenocarcinoma, grade 3 with extensive necrosis and the tumor measuring 6.5 mm in greatest linear extent. The biopsy was negative for angiolymphatic invasion and microcalcifications. The tumor cells are negative for HER2 (1+), ER (0%), and PR (0%).   Today, she states that she is doing well overall. Her appetite level is at 100%. Her energy level is at 85%. She is accompanied by her sister.  She has an appointment with Dr. Henreitta Leber this afternoon and has 2 additional left breast biopsies scheduled tomorrow. Prior to MM she had not felt any abnormalities on the breasts. She does have a history of right breast biopsy that was benign in 2018. She had menarche at age 89 and a total hysterectomy with appendectomy at 27 for fibroids in uterus. She denies any hot flashes or other menopausal symptoms. She has 3 daughters with her first childbirth at 17. She has used birth control for less than a year and never taken hormone replacements. She denies any new pains in the last few months. She does note tingling and numbness in the bilateral toes at night for the past 5 years after an MI in 2019. She does not have diabetes. She reports intermittent ankle swellings that she  treats with compression socks.   INTERVAL HISTORY:   Meredith Sanchez is a 58 y.o. female presenting to the clinic today for follow-up of Triple negative breast cancer of upper-inner quadrant of left female breast. She was last seen by me on 01/05/24.  Today, she states that she is doing well overall. Her appetite level is at 100%. Her energy level is at 50%.   PAST MEDICAL HISTORY:   Past Medical History: Past Medical History:  Diagnosis Date   Allergic rhinitis    Family history of breast cancer    Hyperlipidemia    Hypertension    Myocardial infarction [facility])    Obesity     Surgical History: Past Surgical History:  Procedure Laterality Date   ABDOMINAL HYSTERECTOMY     APPENDECTOMY  11/30/2001   BREAST BIOPSY Left 11/02/2023   path pending   BREAST BIOPSY Left 11/02/2023   [place] LT BREAST BX W LOC DEV 1ST LESION IMG BX SPEC US GUIDE 11/02/2023 [facility]   BREAST BIOPSY Left 11/17/2023   MM LT BREAST BX W LOC DEV 1ST LESION IMAGE BX SPEC STEREO GUIDE 11/17/2023 [facility]MOGRAPHY   BREAST BIOPSY Left 11/17/2023   MM LT BREAST BX W LOC DEV EA AD LESION IMG BX SPEC STEREO GUIDE 11/17/2023 [facility]   COLONOSCOPY N/A 08/20/2014   Procedure: COLONOSCOPY;  Surgeon: West Bali, MD;  Location: [facility] ENDO SUITE;  Service: Endoscopy;  Laterality: N/A;  2:15pm   LEFT OOPHORECTOMY  11/30/2001   PORTACATH PLACEMENT Right 12/08/2023   Procedure: INSERTION SUBCLAVIAN PORT-A-CATH;  Surgeon: Lucretia Roers, MD;  Location: [facility] ORS;  Service: General;  Laterality: Right;   SIMPLE MASTECTOMY WITH AXILLARY SENTINEL NODE BIOPSY Left 12/08/2023   Procedure: SIMPLE MASTECTOMY WITH AXILLARY SENTINEL NODE BIOPSY;  Surgeon: Lucretia Roers, MD;  Location: [facility] ORS;  Service: General;  Laterality: Left;   TUBAL LIGATION     VESICOVAGINAL FISTULA CLOSURE W/ TAH  11/30/2001    Social History: Social History   Socioeconomic History   Marital status: Married    Spouse name: Not on file    Number of children: 3   Years of education: Not on file   Highest education level: Not on file  Occupational History   Occupation: employed     Comment: [employer]  Tobacco Use   Smoking status: Never   Smokeless tobacco: Never  Vaping Use   Vaping status: Never Used  Substance and Sexual Activity   Alcohol use: No   Drug use: No   Sexual activity: Not on file  Other Topics Concern   Not on file  Social History Narrative   Not on file  Social Drivers of Health   Financial Resource Strain: High Risk (01/19/2024)   Overall Financial Resource Strain (CARDIA)    Difficulty of Paying Living Expenses: Very hard  Food Insecurity: Patient Declined (12/08/2023)   Hunger Vital Sign    Worried About Running Out of Food in the Last Year: Patient declined    Ran Out of Food in the Last Year: Patient declined  Transportation Needs: Patient Declined (12/08/2023)   PRAPARE - Administrator, Civil Service (Medical): Patient declined    Lack of Transportation (Non-Medical): Patient declined  Physical Activity: Inactive (12/08/2018)   Received [facility], [facility]   Exercise Vital Sign    Days of Exercise per Week: 0 days    Minutes of Exercise per Session: 0 min  Stress: Not on file  Social Connections: Not on file  Intimate Partner Violence: Patient Declined (12/08/2023)   Humiliation, Afraid, Rape, and Kick questionnaire    Fear of Current or Ex-Partner: Patient declined    Emotionally Abused: Patient declined    Physically Abused: Patient declined    Sexually Abused: Patient declined    Family History: Family History  Problem Relation Age of Onset   Diabetes Mother    Hypertension Mother    Hypertension Father    Lung cancer Brother 37       metastatic lung cancer to brain   Breast cancer Cousin 66       pat first cousin   Colon cancer Neg Hx     Current Medications:  Current Outpatient Medications:    amlodipine-olmesartan (AZOR) 10-20 MG tablet,  TAKE 1 TABLET BY MOUTH DAILY, Disp: 90 tablet, Rfl: 3   APPLE CIDER VINEGAR PO, Take 1 capsule by mouth daily., Disp: , Rfl:    Ascorbic Acid (VITAMIN C PO), Take 1 capsule by mouth daily., Disp: , Rfl:    ASHWAGANDHA PO, Take 2 tablets by mouth daily., Disp: , Rfl:    ASPIRIN 81 PO, Take 1 tablet by mouth daily., Disp: , Rfl:    atorvastatin (LIPITOR) 40 MG tablet, TAKE 1 TABLET(40 MG) BY MOUTH DAILY, Disp: 90 tablet, Rfl: 3   azelastine (ASTELIN) 0.1 % nasal spray, Place 2 sprays into both nostrils 2 (two) times daily., Disp: , Rfl:    CYCLOPHOSPHAMIDE IV, Inject into the vein., Disp: , Rfl:    docusate sodium (COLACE) 100 MG capsule, Take 1 capsule (100 mg total) by mouth 2 (two) times daily as needed for mild constipation., Disp: 30 capsule, Rfl: 0   DOXOrubicin HCl (ADRIAMYCIN IV), Inject into the vein., Disp: , Rfl:    fluticasone (FLONASE) 50 MCG/ACT nasal spray, Place 2 sprays into both nostrils daily., Disp: , Rfl:    gabapentin (NEURONTIN) 100 MG capsule, Take 1 capsule (100 mg total) by mouth at bedtime., Disp: 90 capsule, Rfl: 1   lidocaine-prilocaine (EMLA) cream, Apply to affected area once, Disp: 30 g, Rfl: 3   magnesium oxide (MAG-OX) 400 (240 Mg) MG tablet, Take 1 tablet (400 mg total) by mouth daily., Disp: 30 tablet, Rfl: 3   Multiple Vitamin (MULTIVITAMIN) tablet, Take 1 tablet by mouth daily., Disp: , Rfl:    ondansetron (ZOFRAN-ODT) 4 MG disintegrating tablet, Take 1 tablet (4 mg total) by mouth every 6 (six) hours as needed for nausea., Disp: 20 tablet, Rfl: 0   oxyCODONE (OXY IR/ROXICODONE) 5 MG immediate release tablet, Take 1 tablet (5 mg total) by mouth every 12 (twelve) hours as needed for severe  pain (pain score 7-10) or breakthrough pain., Disp: 8 tablet, Rfl: 0   PACLitaxel (TAXOL IV), Inject into the vein., Disp: , Rfl:    Pegfilgrastim (NEULASTA Amelia), Inject into the skin., Disp: , Rfl:    prochlorperazine (COMPAZINE) 10 MG tablet, Take 1 tablet (10 mg total) by  mouth every 6 (six) hours as needed for nausea or vomiting., Disp: 60 tablet, Rfl: 3   triamterene-hydrochlorothiazide (MAXZIDE-25) 37.5-25 MG tablet, TAKE 1 TABLET BY MOUTH DAILY, Disp: 90 tablet, Rfl: 3   Allergies: Allergies  Allergen Reactions   Ace Inhibitors Shortness Of Breath    Roof of mouth  Tingling, throat closing   Penicillins     Yeast infections    Poison Ivy Extract Rash    REVIEW OF SYSTEMS:   Review of Systems  Constitutional:  Negative for chills, fatigue and fever.  HENT:   Negative for lump/mass, mouth sores, nosebleeds, sore throat and trouble swallowing.   Eyes:  Negative for eye problems.  Respiratory:  Negative for cough and shortness of breath.   Cardiovascular:  Negative for chest pain, leg swelling and palpitations.  Gastrointestinal:  Positive for nausea. Negative for abdominal pain, constipation, diarrhea and vomiting.  Genitourinary:  Positive for dysuria. Negative for bladder incontinence, difficulty urinating, frequency, hematuria and nocturia.   Musculoskeletal:  Negative for arthralgias, back pain, flank pain, myalgias and neck pain.  Skin:  Negative for itching and rash.  Neurological:  Positive for dizziness. Negative for headaches and numbness.  Hematological:  Does not bruise/bleed easily.  Psychiatric/Behavioral:  Negative for depression, sleep disturbance and suicidal ideas. The patient is not nervous/anxious.   All other systems reviewed and are negative.    VITALS:   There were no vitals taken for this visit.  Wt Readings from Last 3 Encounters:  01/19/24 186 lb 9.6 oz (84.6 kg)  01/19/24 186 lb 4.6 oz (84.5 kg)  01/05/24 182 lb 15.7 oz (83 kg)    There is no height or weight on file to calculate BMI.  Performance status (ECOG): 1 - Symptomatic but completely ambulatory  PHYSICAL EXAM:   Physical Exam Vitals and nursing note reviewed. Exam conducted with a chaperone present.  Constitutional:      Appearance: Normal appearance.   Cardiovascular:     Rate and Rhythm: Normal rate and regular rhythm.     Pulses: Normal pulses.     Heart sounds: Normal heart sounds.  Pulmonary:     Effort: Pulmonary effort is normal.     Breath sounds: Normal breath sounds.  Abdominal:     Palpations: Abdomen is soft. There is no hepatomegaly, splenomegaly or mass.     Tenderness: There is no abdominal tenderness.  Musculoskeletal:     Right lower leg: No edema.     Left lower leg: No edema.  Lymphadenopathy:     Cervical: No cervical adenopathy.     Right cervical: No superficial, deep or posterior cervical adenopathy.    Left cervical: No superficial, deep or posterior cervical adenopathy.     Upper Body:     Right upper body: No supraclavicular or axillary adenopathy.     Left upper body: No supraclavicular or axillary adenopathy.  Neurological:     General: No focal deficit present.     Mental Status: She is alert and oriented to person, place, and time.  Psychiatric:        Mood and Affect: Mood normal.        Behavior: Behavior normal.  Breast Exam Chaperone: Kennith Gain, RN   LABS:      Latest Ref Rng & Units 02/02/2024   11:51 AM 01/19/2024    8:03 AM 12/09/2023    3:40 AM  CBC  WBC 4.0 - 10.5 K/uL 5.5  5.8  10.8   Hemoglobin 12.0 - 15.0 g/dL 96.2  95.2  84.1   Hematocrit 36.0 - 46.0 % 33.2  37.1  34.5   Platelets 150 - 400 K/uL 186  322  199       Latest Ref Rng & Units 02/02/2024   11:51 AM 01/19/2024    8:03 AM 12/09/2023    3:40 AM  CMP  Glucose 70 - 99 mg/dL 324  401  027   BUN 6 - 20 mg/dL 20  30  34   Creatinine 0.44 - 1.00 mg/dL 2.53  6.64  4.03   Sodium 135 - 145 mmol/L 136  138  135   Potassium 3.5 - 5.1 mmol/L 3.6  3.1  4.0   Chloride 98 - 111 mmol/L 99  102  104   CO2 22 - 32 mmol/L 23  22  20    Calcium 8.9 - 10.3 mg/dL 9.2  9.4  8.8   Total Protein 6.5 - 8.1 g/dL 7.2  7.6    Total Bilirubin 0.0 - 1.2 mg/dL 0.5  1.2    Alkaline Phos 38 - 126 U/L 61  50    AST 15 - 41 U/L 41  28    ALT 0  - 44 U/L 45  24       No results found for: "CEA1", "CEA" / No results found for: "CEA1", "CEA" No results found for: "PSA1" No results found for: "KVQ259" No results found for: "CAN125"  No results found for: "TOTALPROTELP", "ALBUMINELP", "A1GS", "A2GS", "BETS", "BETA2SER", "GAMS", "MSPIKE", "SPEI" Lab Results  Component Value Date   TIBC 344 08/17/2014   FERRITIN 348 (H) 08/17/2014   FERRITIN 134 07/09/2014   IRONPCTSAT 28 08/17/2014   No results found for: "LDH"   STUDIES:   No results found.

## 2024-02-02 ENCOUNTER — Inpatient Hospital Stay: Payer: BC Managed Care – PPO | Attending: Genetic Counselor

## 2024-02-02 ENCOUNTER — Encounter: Payer: Self-pay | Admitting: *Deleted

## 2024-02-02 ENCOUNTER — Encounter: Payer: Self-pay | Admitting: Hematology

## 2024-02-02 ENCOUNTER — Inpatient Hospital Stay: Payer: BC Managed Care – PPO

## 2024-02-02 ENCOUNTER — Inpatient Hospital Stay: Payer: BC Managed Care – PPO | Admitting: Hematology

## 2024-02-02 ENCOUNTER — Other Ambulatory Visit: Payer: Self-pay | Admitting: *Deleted

## 2024-02-02 VITALS — BP 140/60 | HR 56 | Temp 98.0°F | Resp 18

## 2024-02-02 VITALS — Wt 189.0 lb

## 2024-02-02 DIAGNOSIS — Z5189 Encounter for other specified aftercare: Secondary | ICD-10-CM | POA: Insufficient documentation

## 2024-02-02 DIAGNOSIS — R7303 Prediabetes: Secondary | ICD-10-CM | POA: Insufficient documentation

## 2024-02-02 DIAGNOSIS — Z171 Estrogen receptor negative status [ER-]: Secondary | ICD-10-CM | POA: Diagnosis not present

## 2024-02-02 DIAGNOSIS — R300 Dysuria: Secondary | ICD-10-CM

## 2024-02-02 DIAGNOSIS — Z5111 Encounter for antineoplastic chemotherapy: Secondary | ICD-10-CM | POA: Insufficient documentation

## 2024-02-02 DIAGNOSIS — G629 Polyneuropathy, unspecified: Secondary | ICD-10-CM | POA: Insufficient documentation

## 2024-02-02 DIAGNOSIS — C50212 Malignant neoplasm of upper-inner quadrant of left female breast: Secondary | ICD-10-CM | POA: Diagnosis not present

## 2024-02-02 DIAGNOSIS — C50812 Malignant neoplasm of overlapping sites of left female breast: Secondary | ICD-10-CM | POA: Insufficient documentation

## 2024-02-02 DIAGNOSIS — D649 Anemia, unspecified: Secondary | ICD-10-CM | POA: Insufficient documentation

## 2024-02-02 DIAGNOSIS — Z87440 Personal history of urinary (tract) infections: Secondary | ICD-10-CM | POA: Insufficient documentation

## 2024-02-02 LAB — URINALYSIS, ROUTINE W REFLEX MICROSCOPIC
Bilirubin Urine: NEGATIVE
Glucose, UA: 50 mg/dL — AB
Hgb urine dipstick: NEGATIVE
Ketones, ur: NEGATIVE mg/dL
Nitrite: NEGATIVE
Protein, ur: NEGATIVE mg/dL
Specific Gravity, Urine: 1.017 (ref 1.005–1.030)
WBC, UA: >50 WBC/hpf (ref 0–5)
pH: 6 (ref 5.0–8.0)

## 2024-02-02 LAB — COMPREHENSIVE METABOLIC PANEL
ALT: 45 U/L — ABNORMAL HIGH (ref 0–44)
AST: 41 U/L (ref 15–41)
Albumin: 3.9 g/dL (ref 3.5–5.0)
Alkaline Phosphatase: 61 U/L (ref 38–126)
Anion gap: 14 (ref 5–15)
BUN: 20 mg/dL (ref 6–20)
CO2: 23 mmol/L (ref 22–32)
Calcium: 9.2 mg/dL (ref 8.9–10.3)
Chloride: 99 mmol/L (ref 98–111)
Creatinine, Ser: 1.04 mg/dL — ABNORMAL HIGH (ref 0.44–1.00)
GFR, Estimated: >60 mL/min (ref >60)
Glucose, Bld: 237 mg/dL — ABNORMAL HIGH (ref 70–99)
Potassium: 3.6 mmol/L (ref 3.5–5.1)
Sodium: 136 mmol/L (ref 135–145)
Total Bilirubin: 0.5 mg/dL (ref 0.0–1.2)
Total Protein: 7.2 g/dL (ref 6.5–8.1)

## 2024-02-02 LAB — CBC WITH DIFFERENTIAL/PLATELET
Abs Immature Granulocytes: 0.27 10*3/uL — ABNORMAL HIGH (ref 0.00–0.07)
Basophils Absolute: 0.1 10*3/uL (ref 0.0–0.1)
Basophils Relative: 1 %
Eosinophils Absolute: 0.1 10*3/uL (ref 0.0–0.5)
Eosinophils Relative: 1 %
HCT: 33.2 % — ABNORMAL LOW (ref 36.0–46.0)
Hemoglobin: 11.2 g/dL — ABNORMAL LOW (ref 12.0–15.0)
Immature Granulocytes: 5 %
Lymphocytes Relative: 18 %
Lymphs Abs: 1 10*3/uL (ref 0.7–4.0)
MCH: 31.9 pg (ref 26.0–34.0)
MCHC: 33.7 g/dL (ref 30.0–36.0)
MCV: 94.6 fL (ref 80.0–100.0)
Monocytes Absolute: 0.5 10*3/uL (ref 0.1–1.0)
Monocytes Relative: 9 %
Neutro Abs: 3.6 10*3/uL (ref 1.7–7.7)
Neutrophils Relative %: 66 %
Platelets: 186 10*3/uL (ref 150–400)
RBC: 3.51 MIL/uL — ABNORMAL LOW (ref 3.87–5.11)
RDW: 13.8 % (ref 11.5–15.5)
WBC: 5.5 10*3/uL (ref 4.0–10.5)
nRBC: 0.4 % — ABNORMAL HIGH (ref 0.0–0.2)

## 2024-02-02 LAB — MAGNESIUM: Magnesium: 1.5 mg/dL — ABNORMAL LOW (ref 1.7–2.4)

## 2024-02-02 MED ORDER — MAGNESIUM OXIDE -MG SUPPLEMENT 400 (240 MG) MG PO TABS: 400.0000 mg | ORAL_TABLET | Freq: Every day | ORAL | 3 refills | Status: AC

## 2024-02-02 MED ORDER — FOSAPREPITANT DIMEGLUMINE INJECTION 150 MG
150.0000 mg | Freq: Once | INTRAVENOUS | Status: AC
  Administered 2024-02-02: 150 mg via INTRAVENOUS
  Filled 2024-02-02: qty 150

## 2024-02-02 MED ORDER — PALONOSETRON HCL INJECTION 0.25 MG/5ML
0.2500 mg | Freq: Once | INTRAVENOUS | Status: AC
  Administered 2024-02-02: 0.25 mg via INTRAVENOUS
  Filled 2024-02-02: qty 5

## 2024-02-02 MED ORDER — CYCLOPHOSPHAMIDE CHEMO INJECTION 1 GM
600.0000 mg/m2 | Freq: Once | INTRAMUSCULAR | Status: AC
  Administered 2024-02-02: 1180 mg via INTRAVENOUS
  Filled 2024-02-02: qty 59

## 2024-02-02 MED ORDER — PEGFILGRASTIM 6 MG/0.6ML ~~LOC~~ PSKT
6.0000 mg | PREFILLED_SYRINGE | Freq: Once | SUBCUTANEOUS | Status: AC
  Administered 2024-02-02: 6 mg via SUBCUTANEOUS
  Filled 2024-02-02: qty 0.6

## 2024-02-02 MED ORDER — SODIUM CHLORIDE 0.9% FLUSH
10.0000 mL | INTRAVENOUS | Status: DC | PRN
  Administered 2024-02-02: 10 mL

## 2024-02-02 MED ORDER — DOXORUBICIN HCL CHEMO IV INJECTION 2 MG/ML
60.0000 mg/m2 | Freq: Once | INTRAVENOUS | Status: AC
  Administered 2024-02-02: 118 mg via INTRAVENOUS
  Filled 2024-02-02: qty 59

## 2024-02-02 MED ORDER — SODIUM CHLORIDE 0.9 % IV SOLN: INTRAVENOUS | Status: DC

## 2024-02-02 MED ORDER — MAGNESIUM SULFATE 2 GM/50ML IV SOLN
2.0000 g | Freq: Once | INTRAVENOUS | Status: AC
  Administered 2024-02-02: 2 g via INTRAVENOUS
  Filled 2024-02-02: qty 50

## 2024-02-02 MED ORDER — CIPROFLOXACIN HCL 500 MG PO TABS: 500.0000 mg | ORAL_TABLET | Freq: Two times a day (BID) | ORAL | 0 refills | Status: DC

## 2024-02-02 MED ORDER — HEPARIN SOD (PORK) LOCK FLUSH 100 UNIT/ML IV SOLN
500.0000 [IU] | Freq: Once | INTRAVENOUS | Status: AC | PRN
  Administered 2024-02-02: 500 [IU]

## 2024-02-02 MED ORDER — INSULIN ASPART 100 UNIT/ML IJ SOLN
10.0000 [IU] | Freq: Once | INTRAMUSCULAR | Status: AC
  Administered 2024-02-02: 10 [IU] via SUBCUTANEOUS
  Filled 2024-02-02: qty 1

## 2024-02-02 MED ORDER — DEXAMETHASONE SODIUM PHOSPHATE 10 MG/ML IJ SOLN
10.0000 mg | Freq: Once | INTRAMUSCULAR | Status: AC
  Administered 2024-02-02: 10 mg via INTRAVENOUS
  Filled 2024-02-02: qty 1

## 2024-02-02 NOTE — Patient Instructions (Signed)

## 2024-02-02 NOTE — Progress Notes (Signed)
 Patient presents today for [facility] treatment and follow up visit with Dr. Ellin Saba. Magnesium is 1.5. Standing orders followed. Patient will receive 2 grams of Magnesium per standing orders. Blood glucose 237 . Patient will receive 10 units of Novolog [place]. Vital signs stable and within parameters for treatment. Labs within parameters for treatment.   Per Dr. Ellin Saba / A. Anderson RN patient will get Neulasta OnPro kit prior to discharge and discontinue injection.    Urine culture added on to the urine sent to the lab. Spoke with Sue Lush in the lab and urine culture in process.   Neulasta Onpro placed on patient's right arm. Patient teaching performed and understanding verbalized.   Treatment given today per MD orders. Tolerated infusion without adverse affects. Vital signs stable. No complaints at this time. Discharged from clinic ambulatory in stable condition. Alert and oriented x 3. F/U with [facility] as scheduled.

## 2024-02-02 NOTE — Progress Notes (Signed)
 Patient requested OnPro with chemotherapy due to issues with Fulphila.  PA approved.  OnPro 6 mg subcutaneous added to day 1 of AC plan.   T.O. Dr Carilyn Goodpasture, PharmD

## 2024-02-02 NOTE — Patient Instructions (Signed)
 [facility] [place] - A DEPT OF [facility]  Discharge Instructions: Thank you for choosing [facility] to provide your oncology and hematology care.  If you have a lab appointment with the Cancer Center - please note that after April 8th, 2024, all labs will be drawn in the cancer center.  You do not have to check in or register with the main entrance as you have in the past but will complete your check-in in the cancer center.  Wear comfortable clothing and clothing appropriate for easy access to any Portacath or PICC line.   We strive to give you quality time with your provider. You may need to reschedule your appointment if you arrive late (15 or more minutes).  Arriving late affects you and other patients whose appointments are after yours.  Also, if you miss three or more appointments without notifying the office, you may be dismissed from the clinic at the provider's discretion.      For prescription refill requests, have your pharmacy contact our office and allow 72 hours for refills to be completed.    Today you received the following chemotherapy and/or immunotherapy agents Adriamycin, Cytoxan. Pegfilgrastim Injection What is this medication? PEGFILGRASTIM (PEG fil gra stim) lowers the risk of infection in people who are receiving chemotherapy. It works by Systems analyst make more white blood cells, which protects your body from infection. It may also be used to help people who have been exposed to high doses of radiation. This medicine may be used for other purposes; ask your health care provider or pharmacist if you have questions. COMMON BRAND NAME(S): Cherly Hensen, Neulasta, Nyvepria, Stimufend, UDENYCA, UDENYCA ONBODY, Ziextenzo What should I tell my care team before I take this medication? They need to know if you have any of these conditions: Kidney disease Latex allergy Ongoing radiation therapy Sickle cell disease Skin reactions  to acrylic adhesives (On-Body Injector only) An unusual or allergic reaction to pegfilgrastim, filgrastim, other medications, foods, dyes, or preservatives Pregnant or trying to get pregnant Breast-feeding How should I use this medication? This medication is for injection under the skin. If you get this medication at home, you will be taught how to prepare and give the pre-filled syringe or how to use the On-body Injector. Refer to the patient Instructions for Use for detailed instructions. Use exactly as directed. Tell your care team immediately if you suspect that the On-body Injector may not have performed as intended or if you suspect the use of the On-body Injector resulted in a missed or partial dose. It is important that you put your used needles and syringes in a special sharps container. Do not put them in a trash can. If you do not have a sharps container, call your pharmacist or care team to get one. Talk to your care team about the use of this medication in children. While this medication may be prescribed for selected conditions, precautions do apply. Overdosage: If you think you have taken too much of this medicine contact a poison control center or emergency room at once. NOTE: This medicine is only for you. Do not share this medicine with others. What if I miss a dose? It is important not to miss your dose. Call your care team if you miss your dose. If you miss a dose due to an On-body Injector failure or leakage, a new dose should be administered as soon as possible using a single prefilled syringe for  manual use. What may interact with this medication? Interactions have not been studied. This list may not describe all possible interactions. Give your health care provider a list of all the medicines, herbs, non-prescription drugs, or dietary supplements you use. Also tell them if you smoke, drink alcohol, or use illegal drugs. Some items may interact with your medicine. What should I  watch for while using this medication? Your condition will be monitored carefully while you are receiving this medication. You may need blood work done while you are taking this medication. Talk to your care team about your risk of cancer. You may be more at risk for certain types of cancer if you take this medication. If you are going to need a MRI, CT scan, or other procedure, tell your care team that you are using this medication (On-Body Injector only). What side effects may I notice from receiving this medication? Side effects that you should report to your care team as soon as possible: Allergic reactions--skin rash, itching, hives, swelling of the face, lips, tongue, or throat Capillary leak syndrome--stomach or muscle pain, unusual weakness or fatigue, feeling faint or lightheaded, decrease in the amount of urine, swelling of the ankles, hands, or feet, trouble breathing High white blood cell level--fever, fatigue, trouble breathing, night sweats, change in vision, weight loss Inflammation of the aorta--fever, fatigue, back, chest, or stomach pain, severe headache Kidney injury (glomerulonephritis)--decrease in the amount of urine, red or dark Mcmurry urine, foamy or bubbly urine, swelling of the ankles, hands, or feet Shortness of breath or trouble breathing Spleen injury--pain in upper left stomach or shoulder Unusual bruising or bleeding Side effects that usually do not require medical attention (report to your care team if they continue or are bothersome): Bone pain Pain in the hands or feet This list may not describe all possible side effects. Call your doctor for medical advice about side effects. You may report side effects to FDA at 1-800-FDA-1088. Where should I keep my medication? Keep out of the reach of children. If you are using this medication at home, you will be instructed on how to store it. Throw away any unused medication after the expiration date on the label. NOTE:  This sheet is a summary. It may not cover all possible information. If you have questions about this medicine, talk to your doctor, pharmacist, or health care provider.  2024 Elsevier/Gold Standard (2021-10-17 00:00:00)Cyclophosphamide Injection What is this medication? CYCLOPHOSPHAMIDE (sye kloe FOSS fa mide) treats some types of cancer. It works by slowing down the growth of cancer cells. This medicine may be used for other purposes; ask your health care provider or pharmacist if you have questions. COMMON BRAND NAME(S): Cyclophosphamide, Cytoxan, Neosar What should I tell my care team before I take this medication? They need to know if you have any of these conditions: Heart disease Irregular heartbeat or rhythm Infection Kidney problems Liver disease Low blood cell levels (white cells, platelets, or red blood cells) Lung disease Previous radiation Trouble passing urine An unusual or allergic reaction to cyclophosphamide, other medications, foods, dyes, or preservatives Pregnant or trying to get pregnant Breast-feeding How should I use this medication? This medication is injected into a vein. It is given by your care team in a hospital or clinic setting. Talk to your care team about the use of this medication in children. Special care may be needed. Overdosage: If you think you have taken too much of this medicine contact a poison control center or emergency room  at once. NOTE: This medicine is only for you. Do not share this medicine with others. What if I miss a dose? Keep appointments for follow-up doses. It is important not to miss your dose. Call your care team if you are unable to keep an appointment. What may interact with this medication? Amphotericin B Amiodarone Azathioprine Certain antivirals for HIV or hepatitis Certain medications for blood pressure, such as enalapril, lisinopril, quinapril Cyclosporine Diuretics Etanercept Indomethacin Medications that relax  muscles Metronidazole Natalizumab Tamoxifen Warfarin This list may not describe all possible interactions. Give your health care provider a list of all the medicines, herbs, non-prescription drugs, or dietary supplements you use. Also tell them if you smoke, drink alcohol, or use illegal drugs. Some items may interact with your medicine. What should I watch for while using this medication? This medication may make you feel generally unwell. This is not uncommon as chemotherapy can affect healthy cells as well as cancer cells. Report any side effects. Continue your course of treatment even though you feel ill unless your care team tells you to stop. You may need blood work while you are taking this medication. This medication may increase your risk of getting an infection. Call your care team for advice if you get a fever, chills, sore throat, or other symptoms of a cold or flu. Do not treat yourself. Try to avoid being around people who are sick. Avoid taking medications that contain aspirin, acetaminophen, ibuprofen, naproxen, or ketoprofen unless instructed by your care team. These medications may hide a fever. Be careful brushing or flossing your teeth or using a toothpick because you may get an infection or bleed more easily. If you have any dental work done, tell your dentist you are receiving this medication. Drink water or other fluids as directed. Urinate often, even at night. Some products may contain alcohol. Ask your care team if this medication contains alcohol. Be sure to tell all care teams you are taking this medicine. Certain medicines, like metronidazole and disulfiram, can cause an unpleasant reaction when taken with alcohol. The reaction includes flushing, headache, nausea, vomiting, sweating, and increased thirst. The reaction can last from 30 minutes to several hours. Talk to your care team if you wish to become pregnant or think you might be pregnant. This medication can cause  serious birth defects if taken during pregnancy and for 1 year after the last dose. A negative pregnancy test is required before starting this medication. A reliable form of contraception is recommended while taking this medication and for 1 year after the last dose. Talk to your care team about reliable forms of contraception. Do not father a child while taking this medication and for 4 months after the last dose. Use a condom during this time period. Do not breast-feed while taking this medication or for 1 week after the last dose. This medication may cause infertility. Talk to your care team if you are concerned about your fertility. Talk to your care team about your risk of cancer. You may be more at risk for certain types of cancer if you take this medication. What side effects may I notice from receiving this medication? Side effects that you should report to your care team as soon as possible: Allergic reactions--skin rash, itching, hives, swelling of the face, lips, tongue, or throat Dry cough, shortness of breath or trouble breathing Heart failure--shortness of breath, swelling of the ankles, feet, or hands, sudden weight gain, unusual weakness or fatigue Heart muscle inflammation--unusual weakness  or fatigue, shortness of breath, chest pain, fast or irregular heartbeat, dizziness, swelling of the ankles, feet, or hands Heart rhythm changes--fast or irregular heartbeat, dizziness, feeling faint or lightheaded, chest pain, trouble breathing Infection--fever, chills, cough, sore throat, wounds that don't heal, pain or trouble when passing urine, general feeling of discomfort or being unwell Kidney injury--decrease in the amount of urine, swelling of the ankles, hands, or feet Liver injury--right upper belly pain, loss of appetite, nausea, light-colored stool, dark yellow or Fidel urine, yellowing skin or eyes, unusual weakness or fatigue Low red blood cell level--unusual weakness or fatigue,  dizziness, headache, trouble breathing Low sodium level--muscle weakness, fatigue, dizziness, headache, confusion Red or dark Buchanan urine Unusual bruising or bleeding Side effects that usually do not require medical attention (report to your care team if they continue or are bothersome): Hair loss Irregular menstrual cycles or spotting Loss of appetite Nausea Pain, redness, or swelling with sores inside the mouth or throat Vomiting This list may not describe all possible side effects. Call your doctor for medical advice about side effects. You may report side effects to FDA at 1-800-FDA-1088. Where should I keep my medication? This medication is given in a hospital or clinic. It will not be stored at home. NOTE: This sheet is a summary. It may not cover all possible information. If you have questions about this medicine, talk to your doctor, pharmacist, or health care provider.  2024 Elsevier/Gold Standard (2022-04-03 00:00:00)Doxorubicin Injection What is this medication? DOXORUBICIN (dox oh ROO bi sin) treats some types of cancer. It works by slowing down the growth of cancer cells. This medicine may be used for other purposes; ask your health care provider or pharmacist if you have questions. COMMON BRAND NAME(S): Adriamycin, Adriamycin PFS, Adriamycin RDF, Rubex What should I tell my care team before I take this medication? They need to know if you have any of these conditions: Heart disease History of low blood cell levels caused by a medication Liver disease Recent or ongoing radiation An unusual or allergic reaction to doxorubicin, other medications, foods, dyes, or preservatives If you or your partner are pregnant or trying to get pregnant Breast-feeding How should I use this medication? This medication is injected into a vein. It is given by your care team in a hospital or clinic setting. Talk to your care team about the use of this medication in children. Special care may be  needed. Overdosage: If you think you have taken too much of this medicine contact a poison control center or emergency room at once. NOTE: This medicine is only for you. Do not share this medicine with others. What if I miss a dose? Keep appointments for follow-up doses. It is important not to miss your dose. Call your care team if you are unable to keep an appointment. What may interact with this medication? 6-mercaptopurine Paclitaxel Phenytoin St. John's wort Trastuzumab Verapamil This list may not describe all possible interactions. Give your health care provider a list of all the medicines, herbs, non-prescription drugs, or dietary supplements you use. Also tell them if you smoke, drink alcohol, or use illegal drugs. Some items may interact with your medicine. What should I watch for while using this medication? Your condition will be monitored carefully while you are receiving this medication. You may need blood work while taking this medication. This medication may make you feel generally unwell. This is not uncommon as chemotherapy can affect healthy cells as well as cancer cells. Report any side  effects. Continue your course of treatment even though you feel ill unless your care team tells you to stop. There is a maximum amount of this medication you should receive throughout your life. The amount depends on the medical condition being treated and your overall health. Your care team will watch how much of this medication you receive. Tell your care team if you have taken this medication before. Your urine may turn red for a few days after your dose. This is not blood. If your urine is dark or Krienke, call your care team. In some cases, you may be given additional medications to help with side effects. Follow all directions for their use. This medication may increase your risk of getting an infection. Call your care team for advice if you get a fever, chills, sore throat, or other symptoms  of a cold or flu. Do not treat yourself. Try to avoid being around people who are sick. This medication may increase your risk to bruise or bleed. Call your care team if you notice any unusual bleeding. Talk to your care team about your risk of cancer. You may be more at risk for certain types of cancers if you take this medication. Talk to your care team if you or your partner may be pregnant. Serious birth defects can occur if you take this medication during pregnancy and for 6 months after the last dose. Contraception is recommended while taking this medication and for 6 months after the last dose. Your care team can help you find the option that works for you. If your partner can get pregnant, use a condom while taking this medication and for 6 months after the last dose. Do not breastfeed while taking this medication. This medication may cause infertility. Talk to your care team if you are concerned about your fertility. What side effects may I notice from receiving this medication? Side effects that you should report to your care team as soon as possible: Allergic reactions--skin rash, itching, hives, swelling of the face, lips, tongue, or throat Heart failure--shortness of breath, swelling of the ankles, feet, or hands, sudden weight gain, unusual weakness or fatigue Heart rhythm changes--fast or irregular heartbeat, dizziness, feeling faint or lightheaded, chest pain, trouble breathing Infection--fever, chills, cough, sore throat, wounds that don't heal, pain or trouble when passing urine, general feeling of discomfort or being unwell Low red blood cell level--unusual weakness or fatigue, dizziness, headache, trouble breathing Painful swelling, warmth, or redness of the skin, blisters or sores at the infusion site Unusual bruising or bleeding Side effects that usually do not require medical attention (report to your care team if they continue or are bothersome): Diarrhea Hair  loss Nausea Pain, redness, or swelling with sores inside the mouth or throat Red urine This list may not describe all possible side effects. Call your doctor for medical advice about side effects. You may report side effects to FDA at 1-800-FDA-1088. Where should I keep my medication? This medication is given in a hospital or clinic. It will not be stored at home. NOTE: This sheet is a summary. It may not cover all possible information. If you have questions about this medicine, talk to your doctor, pharmacist, or health care provider.  2024 Elsevier/Gold Standard (2023-02-18 00:00:00)      To help prevent nausea and vomiting after your treatment, we encourage you to take your nausea medication as directed.  BELOW ARE SYMPTOMS THAT SHOULD BE REPORTED IMMEDIATELY: *FEVER GREATER THAN 100.4 F (38 C) OR  HIGHER *CHILLS OR SWEATING *NAUSEA AND VOMITING THAT IS NOT CONTROLLED WITH YOUR NAUSEA MEDICATION *UNUSUAL SHORTNESS OF BREATH *UNUSUAL BRUISING OR BLEEDING *URINARY PROBLEMS (pain or burning when urinating, or frequent urination) *BOWEL PROBLEMS (unusual diarrhea, constipation, pain near the anus) TENDERNESS IN MOUTH AND THROAT WITH OR WITHOUT PRESENCE OF ULCERS (sore throat, sores in mouth, or a toothache) UNUSUAL RASH, SWELLING OR PAIN  UNUSUAL VAGINAL DISCHARGE OR ITCHING   Items with * indicate a potential emergency and should be followed up as soon as possible or go to the Emergency Department if any problems should occur.  Please show the CHEMOTHERAPY ALERT CARD or IMMUNOTHERAPY ALERT CARD at check-in to the Emergency Department and triage nurse.  Should you have questions after your visit or need to cancel or reschedule your appointment, please contact [facility] [place] - A DEPT OF Eligha Bridegroom [facility] [phone]  and follow the prompts.  Office hours are 8:00 a.m. to 4:30 p.m. Monday - Friday. Please note that voicemails left after 4:00 p.m. may not be returned  until the following business day.  We are closed weekends and major holidays. You have access to a nurse at all times for urgent questions. Please call the main number to the clinic [phone] and follow the prompts.  For any non-urgent questions, you may also contact your provider using MyChart. We now offer e-Visits for anyone 49 and older to request care online for non-urgent symptoms. For details visit [email or web address].   Also download the MyChart app! Go to the app store, search "MyChart", open the app, select [place], and log in with your MyChart username and password.

## 2024-02-02 NOTE — Progress Notes (Signed)
 Patient has been examined by Dr. Ellin Saba. Vital signs and labs have been reviewed by MD - ANC, Creatinine, LFTs, hemoglobin, and platelets are within treatment parameters per M.D. - pt may proceed with treatment.  Primary RN and pharmacy notified.

## 2024-02-03 ENCOUNTER — Telehealth: Payer: Self-pay | Admitting: Hematology

## 2024-02-03 ENCOUNTER — Telehealth: Payer: Self-pay | Admitting: Dietician

## 2024-02-03 ENCOUNTER — Inpatient Hospital Stay: Payer: BC Managed Care – PPO | Admitting: Dietician

## 2024-02-03 NOTE — Telephone Encounter (Signed)
 Nutrition Follow-up:  Pt with left IDC, triple negative. S/p left mastectomy with SNLB (1/8) under the care of Dr. Henreitta Leber. Patient is planning adjuvant chemotherapy with AC x 4 cycles followed by weekly paclitaxel x 12 weeks (first 2/19).   Spoke with pt via telephone. She reports tolerating therapy well overall. Pt endorses nausea and aches for 3 days following injection. Pt is taking compazine which works well, however this makes her quite drowsy. Pt reports appetite is normal, eating 3 meals and including good sources of protein. Patient has been drinking country time lemonade which spikes her sugar. She is going to allow one glass per day and switch to water. Patient is taking daily stool softener. Bowels moving 1-2 times daily.     Medications: reviewed   Labs: glucose 237, Cr 1.04  Anthropometrics: Wt 189 lb on 3/5 increased   2/19 - 186 lb 9.6 oz    NUTRITION DIAGNOSIS: Food and nutrition related knowledge deficit improved    INTERVENTION:  Continue including good sources of protein at every meal Pt will increase intake of water, recommend 64 ounces     MONITORING, EVALUATION, GOAL: wt trends, intake   NEXT VISIT: Thursday April 17 via telephone

## 2024-02-03 NOTE — Telephone Encounter (Signed)
 Enrolled pt into the [employer] fund. Called and left message letting here know. I also provided her with the guidelines

## 2024-02-04 ENCOUNTER — Inpatient Hospital Stay: Payer: BC Managed Care – PPO

## 2024-02-05 LAB — URINE CULTURE: Culture: >=100000 — AB

## 2024-02-07 ENCOUNTER — Encounter: Payer: Self-pay | Admitting: *Deleted

## 2024-02-07 ENCOUNTER — Other Ambulatory Visit: Payer: Self-pay | Admitting: *Deleted

## 2024-02-07 MED ORDER — NITROFURANTOIN MONOHYD MACRO 100 MG PO CAPS: 100.0000 mg | ORAL_CAPSULE | Freq: Two times a day (BID) | ORAL | 0 refills | Status: AC

## 2024-02-08 ENCOUNTER — Telehealth: Payer: Self-pay | Admitting: *Deleted

## 2024-02-08 NOTE — Telephone Encounter (Signed)
 Patient is requesting a letter to be taken out of work indefinitely.  She does not qualify for FMLA, as she has not been with the company long enough to be eligible.  Advised that she would need to speak to her employer to inquire as to their working process for full time disability benefits.  She will refer to them and reach back out with their response.

## 2024-02-15 ENCOUNTER — Other Ambulatory Visit: Payer: Self-pay | Admitting: Family Medicine

## 2024-02-15 NOTE — Progress Notes (Signed)
 [facility] [address], [place] [address]   Clinic Day:  02/16/2024  Referring physician: Kerri Perches, MD  Patient Care Team: Meredith Perches, MD as PCP - General   ASSESSMENT & PLAN:   Assessment:  1.  T1 cN0 G3 ER/PR/HER2-left breast IDC: - Screening mammogram on 10/20/2022: Abnormal - Left breast diagnostic mammogram/ultrasound (10/26/2023): Linear oriented pleomorphic calcifications in the lower inner left breast span 5.9 cm.  There has been interval development of irregular mass associated with these calcifications in the lower inner left breast.  Ultrasound of the left breast shows irregular hypoechoic mass in the left breast at 9 o'clock position measuring 2 x 1.3 x 1.6 cm.  No abnormal lymph nodes in the left axilla. - Left breast 9:00 biopsy (11/02/2023): Poorly differentiated IDC, grade 3, ER/PR negative, HER2 (1+) by IHC.  Ki-67: 20%. - Left breast biopsy LIQ posterior extent and anterior extent (11/17/2023): DCIS, comedo, high nuclear grade. - PET scan (11/25/2023): No evidence of metastatic disease. - Left mastectomy and SLNB on 12/08/2023 by Dr. Henreitta Sanchez - Pathology: 3.9 x 2.2 x 1.5 cm invasive poorly differentiated ductal adenocarcinoma, grade 3 with extensive necrosis, high-grade DCIS with necrosis, margins free, negative angiolymphatic invasion.  0/6 lymph nodes involved.  pT2 pN0. - Adjuvant chemotherapy with dose dense AC x 4 followed by weekly paclitaxel x 12 was recommended. - Cycle 1 of dose dense AC on 01/19/2024.  2. Social/Family History: -Lives at home with her daughter. Works full-time with mild physical activity at work. No tobacco use.  -Brother died of lung cancer in his early 19's with a history of tobacco use. No other family history of cancer.  3. BRIP1 heterozygosity: - We reviewed genetic testing results which was positive for BRIP1 mutation, putting her at high risk for ovarian cancer.  Also at high risk for Fanconi  anemia. - She had unilateral salpingo-oophorectomy more than 10 years ago.  I have recommended salpingo-oophorectomy of the remaining ovary.   also recommended that her daughters be tested for the same mutation.  Plan:  1.  T1 cN0 G3 triple negative left breast IDC: - She received 2 cycles of dose dense AC with Neulasta on pro during cycle 2. - She also had UTI and took Macrobid and felt sick. - Denies any fevers/mucositis. - Labs today: Creatinine 1.07 and stable.  LFTs are normal.  CBC grossly normal with mild anemia from myelosuppression. - May proceed with cycle 3 today with Neulasta on pro.  RTC 2 weeks for follow-up. - She would like to apply for Social Security benefits.  Will make referral to social worker.  2.  Peripheral neuropathy: - She has numbness/tingling and burning sensation in the feet which is worse at nighttime.  She is prediabetic.  She had the symptoms prior to start of therapy.  She cannot sleep as result of the symptoms. - She tried taking gabapentin 100 mg at bedtime which did not help. - Recommend increasing gabapentin to 300 mg at bedtime.  3.  High risk drug monitoring: - 2D echo on 11/23/2023 with LVEF 55% and grade 1 diastolic dysfunction.  No signs of PND or orthopnea.  4.  Hypomagnesemia: - Continue magnesium once daily.  Magnesium is 1.8.   No orders of the defined types were placed in this encounter.     Meredith Sanchez,acting as a Neurosurgeon for Meredith Massed, MD.,have documented all relevant documentation on the behalf of Meredith Massed, MD,as directed by  Meredith Massed, MD while in the presence of Meredith Massed, MD.  I, Meredith Massed MD, have reviewed the above documentation for accuracy and completeness, and I agree with the above.      Meredith Massed, MD   3/19/20251:01 PM  CHIEF COMPLAINT/PURPOSE OF CONSULT:   Diagnosis: Triple negative breast cancer of upper-inner quadrant of left female breast    Cancer Staging  Breast cancer of upper-inner quadrant of left female breast [facility]) Staging form: Breast, AJCC 8th Edition - Clinical stage from 11/15/2023: Stage IIB (cT2, cN0(sn), cM0, G3, ER-, PR-, HER2-) - Signed by Meredith Massed, MD on 01/05/2024    Prior Therapy: Left mastectomy and SLNB  Current Therapy: Dose dense AC followed by weekly paclitaxel   HISTORY OF PRESENT ILLNESS:   Oncology History  Breast cancer of upper-inner quadrant of left female breast (HCC)  11/15/2023 Initial Diagnosis   Breast cancer of upper-inner quadrant of left female breast (HCC)    Genetic Testing   Likely pathogenic variant in BRIP1 called c.3196del (p.Ser1066Hisfs*12) identified on the [facility] Common Hereditary Cancers+RNA panel. VUS in CTNNA1 called c.1070G>A (p.Arg357His) identified. The report date is 11/29/2023.  The Common Hereditary Cancers Panel + RNA offered by [facility] includes sequencing and/or deletion duplication testing of the following 48 genes: APC*, ATM*, AXIN2, BAP1, BARD1, BMPR1A, BRCA1, BRCA2, BRIP1, CDH1, CDK4, CDKN2A (p14ARF), CDKN2A (p16INK4a), CHEK2, CTNNA1, DICER1*, EPCAM*, FH*, GREM1*, HOXB13, KIT, MBD4, MEN1*, MLH1*, MSH2*, MSH3*, MSH6*, MUTYH, NF1*, NTHL1, PALB2, PDGFRA, PMS2*, POLD1*, POLE, PTEN*, RAD51C, RAD51D, SDHA*, SDHB, SDHC*, SDHD, SMAD4, SMARCA4, STK11, TP53, TSC1*, TSC2, VHL.    11/15/2023 Cancer Staging   Staging form: Breast, AJCC 8th Edition - Clinical stage from 11/15/2023: Stage IIB (cT2, cN0(sn), cM0, G3, ER-, PR-, HER2-) - Signed by Meredith Massed, MD on 01/05/2024 Histopathologic type: Infiltrating duct carcinoma, NOS Stage prefix: Initial diagnosis Method of lymph node assessment: Sentinel lymph node biopsy Nuclear grade: G3 Histologic grading system: 3 grade system   01/19/2024 -  Chemotherapy   Patient is on Treatment Plan : BREAST DOSE DENSE AC q14d / PACLitaxel q7d         Meredith Sanchez is a 58 y.o. female presenting to clinic today for  evaluation of left breast cancer at the request of Meredith Perches, MD.  Patient underwent a US of the left breast and diagnostic bilateral MM on 10/26/23 after her last visit with her PCP that found: a highly suspicious 2 cm mass in the left breast at the 9 o'clock position with associated linear oriented calcifications, the mass with calcifications span 5.9 cm. She then had a left breast biopsy on 11/02/23 with pathology revealing: invasive poorly differentiated ductal adenocarcinoma, grade 3 with extensive necrosis and the tumor measuring 6.5 mm in greatest linear extent. The biopsy was negative for angiolymphatic invasion and microcalcifications. The tumor cells are negative for HER2 (1+), ER (0%), and PR (0%).   Today, she states that she is doing well overall. Her appetite level is at 100%. Her energy level is at 85%. She is accompanied by her sister.   She has an appointment with Dr. Henreitta Sanchez this afternoon and has 2 additional left breast biopsies scheduled tomorrow. Prior to MM she had not felt any abnormalities on the breasts. She does have a history of right breast biopsy that was benign in 2018. She had menarche at age 25 and a total hysterectomy with appendectomy at 56 for fibroids in uterus. She denies any hot flashes or other menopausal symptoms. She has 3 daughters  with her first childbirth at 89. She has used birth control for less than a year and never taken hormone replacements. She denies any new pains in the last few months. She does note tingling and numbness in the bilateral toes at night for the past 5 years after an MI in 2019. She does not have diabetes. She reports intermittent ankle swellings that she treats with compression socks.   INTERVAL HISTORY:   KATERINE MORUA is a 58 y.o. female presenting to the clinic today for follow-up of Triple negative breast cancer of upper-inner quadrant of left female breast. She was last seen by me on 02/02/24.  Today, she states that she  is doing well overall. Her appetite level is at 50%. Her energy level is at 50%.   PAST MEDICAL HISTORY:   Past Medical History: Past Medical History:  Diagnosis Date   Allergic rhinitis    Family history of breast cancer    Hyperlipidemia    Hypertension    Myocardial infarction [facility])    Obesity     Surgical History: Past Surgical History:  Procedure Laterality Date   ABDOMINAL HYSTERECTOMY     APPENDECTOMY  11/30/2001   BREAST BIOPSY Left 11/02/2023   path pending   BREAST BIOPSY Left 11/02/2023   [place] LT BREAST BX W LOC DEV 1ST LESION IMG BX SPEC US GUIDE 11/02/2023 [facility]OUND   BREAST BIOPSY Left 11/17/2023   MM LT BREAST BX W LOC DEV 1ST LESION IMAGE BX SPEC STEREO GUIDE 11/17/2023 [facility] MAMMOGRAPHY   BREAST BIOPSY Left 11/17/2023   MM LT BREAST BX W LOC DEV EA AD LESION IMG BX SPEC STEREO GUIDE 11/17/2023 [facility] MAMMOGRAPHY   COLONOSCOPY N/A 08/20/2014   Procedure: COLONOSCOPY;  Surgeon: West Bali, MD;  Location: [facility] ENDO SUITE;  Service: Endoscopy;  Laterality: N/A;  2:15pm   LEFT OOPHORECTOMY  11/30/2001   PORTACATH PLACEMENT Right 12/08/2023   Procedure: INSERTION SUBCLAVIAN PORT-A-CATH;  Surgeon: Lucretia Roers, MD;  Location: [facility] ORS;  Service: General;  Laterality: Right;   SIMPLE MASTECTOMY WITH AXILLARY SENTINEL NODE BIOPSY Left 12/08/2023   Procedure: SIMPLE MASTECTOMY WITH AXILLARY SENTINEL NODE BIOPSY;  Surgeon: Lucretia Roers, MD;  Location: [facility] ORS;  Service: General;  Laterality: Left;   TUBAL LIGATION     VESICOVAGINAL FISTULA CLOSURE W/ TAH  11/30/2001    Social History: Social History   Socioeconomic History   Marital status: Married    Spouse name: Not on file   Number of children: 3   Years of education: Not on file   Highest education level: Not on file  Occupational History   Occupation: employed     Comment: [employer]  Tobacco Use   Smoking status: Never   Smokeless tobacco: Never  Vaping Use   Vaping status: Never Used   Substance and Sexual Activity   Alcohol use: No   Drug use: No   Sexual activity: Not on file  Other Topics Concern   Not on file  Social History Narrative   Not on file   Social Drivers of Health   Financial Resource Strain: High Risk (01/19/2024)   Overall Financial Resource Strain (CARDIA)    Difficulty of Paying Living Expenses: Very hard  Food Insecurity: Patient Declined (12/08/2023)   Hunger Vital Sign    Worried About Running Out of Food in the Last Year: Patient declined    Ran Out of Food in the Last Year: Patient declined  Transportation Needs: Patient Declined (12/08/2023)  PRAPARE - Administrator, Civil Service (Medical): Patient declined    Lack of Transportation (Non-Medical): Patient declined  Physical Activity: Inactive (12/08/2018)   [facility], [facility]   Exercise Vital Sign    Days of Exercise per Week: 0 days    Minutes of Exercise per Session: 0 min  Stress: Not on file  Social Connections: Not on file  Intimate Partner Violence: Patient Declined (12/08/2023)   Humiliation, Afraid, Rape, and Kick questionnaire    Fear of Current or Ex-Partner: Patient declined    Emotionally Abused: Patient declined    Physically Abused: Patient declined    Sexually Abused: Patient declined    Family History: Family History  Problem Relation Age of Onset   Diabetes Mother    Hypertension Mother    Hypertension Father    Lung cancer Brother 9       metastatic lung cancer to brain   Breast cancer Cousin 33       pat first cousin   Colon cancer Neg Hx     Current Medications:  Current Outpatient Medications:    amlodipine-olmesartan (AZOR) 10-20 MG tablet, TAKE 1 TABLET BY MOUTH DAILY, Disp: 90 tablet, Rfl: 3   APPLE CIDER VINEGAR PO, Take 1 capsule by mouth daily., Disp: , Rfl:    Ascorbic Acid (VITAMIN C PO), Take 1 capsule by mouth daily., Disp: , Rfl:    ASHWAGANDHA PO, Take 2 tablets by mouth daily., Disp: , Rfl:     ASPIRIN 81 PO, Take 1 tablet by mouth daily., Disp: , Rfl:    atorvastatin (LIPITOR) 40 MG tablet, TAKE 1 TABLET(40 MG) BY MOUTH DAILY, Disp: 90 tablet, Rfl: 3   azelastine (ASTELIN) 0.1 % nasal spray, Place 2 sprays into both nostrils 2 (two) times daily., Disp: , Rfl:    CYCLOPHOSPHAMIDE IV, Inject into the vein., Disp: , Rfl:    docusate sodium (COLACE) 100 MG capsule, Take 1 capsule (100 mg total) by mouth 2 (two) times daily as needed for mild constipation., Disp: 30 capsule, Rfl: 0   DOXOrubicin HCl (ADRIAMYCIN IV), Inject into the vein., Disp: , Rfl:    fluticasone (FLONASE) 50 MCG/ACT nasal spray, Place 2 sprays into both nostrils daily., Disp: , Rfl:    gabapentin (NEURONTIN) 100 MG capsule, Take 1 capsule (100 mg total) by mouth at bedtime., Disp: 90 capsule, Rfl: 1   lidocaine-prilocaine (EMLA) cream, Apply to affected area once, Disp: 30 g, Rfl: 3   magnesium oxide (MAG-OX) 400 (240 Mg) MG tablet, Take 1 tablet (400 mg total) by mouth daily., Disp: 30 tablet, Rfl: 3   Multiple Vitamin (MULTIVITAMIN) tablet, Take 1 tablet by mouth daily., Disp: , Rfl:    ondansetron (ZOFRAN-ODT) 4 MG disintegrating tablet, Take 1 tablet (4 mg total) by mouth every 6 (six) hours as needed for nausea., Disp: 20 tablet, Rfl: 0   oxyCODONE (OXY IR/ROXICODONE) 5 MG immediate release tablet, Take 1 tablet (5 mg total) by mouth every 12 (twelve) hours as needed for severe pain (pain score 7-10) or breakthrough pain., Disp: 8 tablet, Rfl: 0   PACLitaxel (TAXOL IV), Inject into the vein., Disp: , Rfl:    Pegfilgrastim (NEULASTA [place]), Inject into the skin., Disp: , Rfl:    prochlorperazine (COMPAZINE) 10 MG tablet, Take 1 tablet (10 mg total) by mouth every 6 (six) hours as needed for nausea or vomiting., Disp: 60 tablet, Rfl: 3   triamterene-hydrochlorothiazide (MAXZIDE-25) 37.5-25 MG tablet, TAKE  1 TABLET BY MOUTH DAILY, Disp: 90 tablet, Rfl: 3   Allergies: Allergies  Allergen Reactions   Ace Inhibitors  Shortness Of Breath    Roof of mouth  Tingling, throat closing   Penicillins     Yeast infections    Poison Ivy Extract Rash    REVIEW OF SYSTEMS:   Review of Systems  Constitutional:  Negative for chills, fatigue and fever.  HENT:   Negative for lump/mass, mouth sores, nosebleeds, sore throat and trouble swallowing.   Eyes:  Negative for eye problems.  Respiratory:  Negative for cough and shortness of breath.   Cardiovascular:  Negative for chest pain, leg swelling and palpitations.  Gastrointestinal:  Positive for nausea. Negative for abdominal pain, constipation, diarrhea and vomiting.  Genitourinary:  Negative for bladder incontinence, difficulty urinating, dysuria, frequency, hematuria and nocturia.   Musculoskeletal:  Negative for arthralgias, back pain, flank pain, myalgias and neck pain.  Skin:  Negative for itching and rash.  Neurological:  Positive for headaches and numbness. Negative for dizziness.  Hematological:  Does not bruise/bleed easily.  Psychiatric/Behavioral:  Positive for sleep disturbance. Negative for depression and suicidal ideas. The patient is not nervous/anxious.   All other systems reviewed and are negative.    VITALS:   Weight 184 lb 11.9 oz (83.8 kg).  Wt Readings from Last 3 Encounters:  02/16/24 184 lb 11.9 oz (83.8 kg)  02/02/24 189 lb (85.7 kg)  01/19/24 186 lb 9.6 oz (84.6 kg)    Body mass index is 29.82 kg/m.  Performance status (ECOG): 1 - Symptomatic but completely ambulatory  PHYSICAL EXAM:   Physical Exam Vitals and nursing note reviewed. Exam conducted with a chaperone present.  Constitutional:      Appearance: Normal appearance.  Cardiovascular:     Rate and Rhythm: Normal rate and regular rhythm.     Pulses: Normal pulses.     Heart sounds: Normal heart sounds.  Pulmonary:     Effort: Pulmonary effort is normal.     Breath sounds: Normal breath sounds.  Abdominal:     Palpations: Abdomen is soft. There is no hepatomegaly,  splenomegaly or mass.     Tenderness: There is no abdominal tenderness.  Musculoskeletal:     Right lower leg: No edema.     Left lower leg: No edema.  Lymphadenopathy:     Cervical: No cervical adenopathy.     Right cervical: No superficial, deep or posterior cervical adenopathy.    Left cervical: No superficial, deep or posterior cervical adenopathy.     Upper Body:     Right upper body: No supraclavicular or axillary adenopathy.     Left upper body: No supraclavicular or axillary adenopathy.  Neurological:     General: No focal deficit present.     Mental Status: She is alert and oriented to person, place, and time.  Psychiatric:        Mood and Affect: Mood normal.        Behavior: Behavior normal.  Breast Exam Chaperone: Kennith Gain, RN   LABS:      Latest Ref Rng & Units 02/16/2024   12:09 PM 02/02/2024   11:51 AM 01/19/2024    8:03 AM  CBC  WBC 4.0 - 10.5 K/uL 13.5  5.5  5.8   Hemoglobin 12.0 - 15.0 g/dL 28.4  13.2  44.0   Hematocrit 36.0 - 46.0 % 31.5  33.2  37.1   Platelets 150 - 400 K/uL 235  186  322  Latest Ref Rng & Units 02/16/2024   12:09 PM 02/02/2024   11:51 AM 01/19/2024    8:03 AM  CMP  Glucose 70 - 99 mg/dL 284  132  440   BUN 6 - 20 mg/dL 17  20  30    Creatinine 0.44 - 1.00 mg/dL 1.02  7.25  3.66   Sodium 135 - 145 mmol/L 137  136  138   Potassium 3.5 - 5.1 mmol/L 3.2  3.6  3.1   Chloride 98 - 111 mmol/L 99  99  102   CO2 22 - 32 mmol/L 22  23  22    Calcium 8.9 - 10.3 mg/dL 9.4  9.2  9.4   Total Protein 6.5 - 8.1 g/dL 7.2  7.2  7.6   Total Bilirubin 0.0 - 1.2 mg/dL 0.5  0.5  1.2   Alkaline Phos 38 - 126 U/L 72  61  50   AST 15 - 41 U/L 28  41  28   ALT 0 - 44 U/L 27  45  24      No results found for: "CEA1", "CEA" / No results found for: "CEA1", "CEA" No results found for: "PSA1" No results found for: "YQI347" No results found for: "CAN125"  No results found for: "TOTALPROTELP", "ALBUMINELP", "A1GS", "A2GS", "BETS", "BETA2SER", "GAMS",  "MSPIKE", "SPEI" Lab Results  Component Value Date   TIBC 344 08/17/2014   FERRITIN 348 (H) 08/17/2014   FERRITIN 134 07/09/2014   IRONPCTSAT 28 08/17/2014   No results found for: "LDH"   STUDIES:   No results found.

## 2024-02-16 ENCOUNTER — Encounter: Payer: Self-pay | Admitting: *Deleted

## 2024-02-16 ENCOUNTER — Inpatient Hospital Stay: Payer: BC Managed Care – PPO

## 2024-02-16 ENCOUNTER — Inpatient Hospital Stay (HOSPITAL_BASED_OUTPATIENT_CLINIC_OR_DEPARTMENT_OTHER): Payer: BC Managed Care – PPO | Admitting: Hematology

## 2024-02-16 ENCOUNTER — Inpatient Hospital Stay: Admitting: Licensed Clinical Social Worker

## 2024-02-16 VITALS — Wt 184.7 lb

## 2024-02-16 VITALS — BP 119/65 | HR 88 | Temp 97.9°F | Resp 18

## 2024-02-16 DIAGNOSIS — Z5189 Encounter for other specified aftercare: Secondary | ICD-10-CM | POA: Diagnosis not present

## 2024-02-16 DIAGNOSIS — R7303 Prediabetes: Secondary | ICD-10-CM | POA: Diagnosis not present

## 2024-02-16 DIAGNOSIS — D649 Anemia, unspecified: Secondary | ICD-10-CM | POA: Diagnosis not present

## 2024-02-16 DIAGNOSIS — C50212 Malignant neoplasm of upper-inner quadrant of left female breast: Secondary | ICD-10-CM

## 2024-02-16 DIAGNOSIS — G629 Polyneuropathy, unspecified: Secondary | ICD-10-CM | POA: Diagnosis not present

## 2024-02-16 DIAGNOSIS — Z5111 Encounter for antineoplastic chemotherapy: Secondary | ICD-10-CM | POA: Diagnosis not present

## 2024-02-16 DIAGNOSIS — Z87440 Personal history of urinary (tract) infections: Secondary | ICD-10-CM | POA: Diagnosis not present

## 2024-02-16 DIAGNOSIS — Z171 Estrogen receptor negative status [ER-]: Secondary | ICD-10-CM

## 2024-02-16 DIAGNOSIS — C50812 Malignant neoplasm of overlapping sites of left female breast: Secondary | ICD-10-CM | POA: Diagnosis not present

## 2024-02-16 LAB — CBC WITH DIFFERENTIAL/PLATELET
Abs Immature Granulocytes: 0.9 10*3/uL — ABNORMAL HIGH (ref 0.00–0.07)
Band Neutrophils: 4 %
Basophils Absolute: 0 10*3/uL (ref 0.0–0.1)
Basophils Relative: 0 %
Eosinophils Absolute: 0.1 10*3/uL (ref 0.0–0.5)
Eosinophils Relative: 1 %
HCT: 31.5 % — ABNORMAL LOW (ref 36.0–46.0)
Hemoglobin: 10.5 g/dL — ABNORMAL LOW (ref 12.0–15.0)
Lymphocytes Relative: 8 %
Lymphs Abs: 1.1 10*3/uL (ref 0.7–4.0)
MCH: 31.8 pg (ref 26.0–34.0)
MCHC: 33.3 g/dL (ref 30.0–36.0)
MCV: 95.5 fL (ref 80.0–100.0)
Metamyelocytes Relative: 5 %
Monocytes Absolute: 0.7 10*3/uL (ref 0.1–1.0)
Monocytes Relative: 5 %
Myelocytes: 2 %
Neutro Abs: 10.7 10*3/uL — ABNORMAL HIGH (ref 1.7–7.7)
Neutrophils Relative %: 75 %
Platelets: 235 10*3/uL (ref 150–400)
RBC: 3.3 MIL/uL — ABNORMAL LOW (ref 3.87–5.11)
RDW: 14.6 % (ref 11.5–15.5)
WBC: 13.5 10*3/uL — ABNORMAL HIGH (ref 4.0–10.5)
nRBC: 0.1 % (ref 0.0–0.2)

## 2024-02-16 LAB — URINALYSIS, ROUTINE W REFLEX MICROSCOPIC
Bilirubin Urine: NEGATIVE
Glucose, UA: NEGATIVE mg/dL
Hgb urine dipstick: NEGATIVE
Ketones, ur: NEGATIVE mg/dL
Nitrite: POSITIVE — AB
Protein, ur: NEGATIVE mg/dL
Specific Gravity, Urine: 1.017 (ref 1.005–1.030)
pH: 5 (ref 5.0–8.0)

## 2024-02-16 LAB — COMPREHENSIVE METABOLIC PANEL
ALT: 27 U/L (ref 0–44)
AST: 28 U/L (ref 15–41)
Albumin: 4.1 g/dL (ref 3.5–5.0)
Alkaline Phosphatase: 72 U/L (ref 38–126)
Anion gap: 16 — ABNORMAL HIGH (ref 5–15)
BUN: 17 mg/dL (ref 6–20)
CO2: 22 mmol/L (ref 22–32)
Calcium: 9.4 mg/dL (ref 8.9–10.3)
Chloride: 99 mmol/L (ref 98–111)
Creatinine, Ser: 1.07 mg/dL — ABNORMAL HIGH (ref 0.44–1.00)
GFR, Estimated: >60 mL/min (ref >60)
Glucose, Bld: 216 mg/dL — ABNORMAL HIGH (ref 70–99)
Potassium: 3.2 mmol/L — ABNORMAL LOW (ref 3.5–5.1)
Sodium: 137 mmol/L (ref 135–145)
Total Bilirubin: 0.5 mg/dL (ref 0.0–1.2)
Total Protein: 7.2 g/dL (ref 6.5–8.1)

## 2024-02-16 LAB — MAGNESIUM: Magnesium: 1.8 mg/dL (ref 1.7–2.4)

## 2024-02-16 MED ORDER — SODIUM CHLORIDE 0.9% FLUSH
10.0000 mL | Freq: Once | INTRAVENOUS | Status: AC
  Administered 2024-02-16: 10 mL via INTRAVENOUS

## 2024-02-16 MED ORDER — PEGFILGRASTIM 6 MG/0.6ML ~~LOC~~ PSKT
6.0000 mg | PREFILLED_SYRINGE | Freq: Once | SUBCUTANEOUS | Status: AC
  Administered 2024-02-16: 6 mg via SUBCUTANEOUS
  Filled 2024-02-16: qty 0.6

## 2024-02-16 MED ORDER — SODIUM CHLORIDE 0.9 % IV SOLN
150.0000 mg | Freq: Once | INTRAVENOUS | Status: AC
  Administered 2024-02-16: 150 mg via INTRAVENOUS
  Filled 2024-02-16: qty 150

## 2024-02-16 MED ORDER — SODIUM CHLORIDE 0.9 % IV SOLN
600.0000 mg/m2 | Freq: Once | INTRAVENOUS | Status: AC
  Administered 2024-02-16: 1180 mg via INTRAVENOUS
  Filled 2024-02-16: qty 59

## 2024-02-16 MED ORDER — PALONOSETRON HCL INJECTION 0.25 MG/5ML
0.2500 mg | Freq: Once | INTRAVENOUS | Status: AC
Start: 2024-02-16 — End: 2024-02-16
  Administered 2024-02-16: 0.25 mg via INTRAVENOUS
  Filled 2024-02-16: qty 5

## 2024-02-16 MED ORDER — POTASSIUM CHLORIDE CRYS ER 20 MEQ PO TBCR
40.0000 meq | EXTENDED_RELEASE_TABLET | Freq: Once | ORAL | Status: AC
  Administered 2024-02-16: 40 meq via ORAL
  Filled 2024-02-16: qty 2

## 2024-02-16 MED ORDER — SODIUM CHLORIDE 0.9% FLUSH
10.0000 mL | INTRAVENOUS | Status: DC | PRN
  Administered 2024-02-16: 10 mL

## 2024-02-16 MED ORDER — SODIUM CHLORIDE 0.9 % IV SOLN
INTRAVENOUS | Status: DC
Start: 2024-02-16 — End: 2024-02-16

## 2024-02-16 MED ORDER — INSULIN ASPART 100 UNIT/ML IJ SOLN
10.0000 [IU] | Freq: Once | INTRAMUSCULAR | Status: AC
  Administered 2024-02-16: 10 [IU] via SUBCUTANEOUS
  Filled 2024-02-16: qty 1

## 2024-02-16 MED ORDER — HEPARIN SOD (PORK) LOCK FLUSH 100 UNIT/ML IV SOLN
500.0000 [IU] | Freq: Once | INTRAVENOUS | Status: AC | PRN
  Administered 2024-02-16: 500 [IU]

## 2024-02-16 MED ORDER — DEXAMETHASONE SODIUM PHOSPHATE 10 MG/ML IJ SOLN
10.0000 mg | Freq: Once | INTRAMUSCULAR | Status: AC
  Administered 2024-02-16: 10 mg via INTRAVENOUS
  Filled 2024-02-16: qty 1

## 2024-02-16 MED ORDER — DOXORUBICIN HCL CHEMO IV INJECTION 2 MG/ML
60.0000 mg/m2 | Freq: Once | INTRAVENOUS | Status: AC
  Administered 2024-02-16: 118 mg via INTRAVENOUS
  Filled 2024-02-16: qty 59

## 2024-02-16 NOTE — Progress Notes (Signed)
 Patient has been examined by Dr. Ellin Saba. Vital signs and labs have been reviewed by MD - ANC, Creatinine, LFTs, hemoglobin, and platelets are within treatment parameters per M.D. - pt may proceed with treatment.  Primary RN and pharmacy notified.

## 2024-02-16 NOTE — Progress Notes (Signed)
 Patient presents today for Adriamycin Cytoxan infusion per providers order.  Vital signs and labs reviewed by MD.  Message received from Chapman Moss RN/Dr. Ellin Saba, patient okay for treatment.  Treatment given today per MD orders.  Stable during infusion without adverse affects.  Vital signs stable.  On Pro placed on the back of the right arm, light blinking green.  No complaints at this time.  Discharge from clinic ambulatory in stable condition.  Alert and oriented X 3.  Follow up with [facility] as scheduled.

## 2024-02-16 NOTE — Patient Instructions (Signed)
 [facility] [place] - A DEPT OF [facility]  Discharge Instructions: Thank you for choosing [facility] to provide your oncology and hematology care.  If you have a lab appointment with the Cancer Center - please note that after April 8th, 2024, all labs will be drawn in the cancer center.  You do not have to check in or register with the main entrance as you have in the past but will complete your check-in in the cancer center.  Wear comfortable clothing and clothing appropriate for easy access to any Portacath or PICC line.   We strive to give you quality time with your provider. You may need to reschedule your appointment if you arrive late (15 or more minutes).  Arriving late affects you and other patients whose appointments are after yours.  Also, if you miss three or more appointments without notifying the office, you may be dismissed from the clinic at the provider's discretion.      For prescription refill requests, have your pharmacy contact our office and allow 72 hours for refills to be completed.    Today you received the following chemotherapy and/or immunotherapy agents AC      To help prevent nausea and vomiting after your treatment, we encourage you to take your nausea medication as directed.  BELOW ARE SYMPTOMS THAT SHOULD BE REPORTED IMMEDIATELY: *FEVER GREATER THAN 100.4 F (38 C) OR HIGHER *CHILLS OR SWEATING *NAUSEA AND VOMITING THAT IS NOT CONTROLLED WITH YOUR NAUSEA MEDICATION *UNUSUAL SHORTNESS OF BREATH *UNUSUAL BRUISING OR BLEEDING *URINARY PROBLEMS (pain or burning when urinating, or frequent urination) *BOWEL PROBLEMS (unusual diarrhea, constipation, pain near the anus) TENDERNESS IN MOUTH AND THROAT WITH OR WITHOUT PRESENCE OF ULCERS (sore throat, sores in mouth, or a toothache) UNUSUAL RASH, SWELLING OR PAIN  UNUSUAL VAGINAL DISCHARGE OR ITCHING   Items with * indicate a potential emergency and should be followed up as  soon as possible or go to the Emergency Department if any problems should occur.  Please show the CHEMOTHERAPY ALERT CARD or IMMUNOTHERAPY ALERT CARD at check-in to the Emergency Department and triage nurse.  Should you have questions after your visit or need to cancel or reschedule your appointment, please contact [facility] [place] - A DEPT OF Eligha Bridegroom [facility] [phone]  and follow the prompts.  Office hours are 8:00 a.m. to 4:30 p.m. Monday - Friday. Please note that voicemails left after 4:00 p.m. may not be returned until the following business day.  We are closed weekends and major holidays. You have access to a nurse at all times for urgent questions. Please call the main number to the clinic [phone] and follow the prompts.  For any non-urgent questions, you may also contact your provider using MyChart. We now offer e-Visits for anyone 80 and older to request care online for non-urgent symptoms. For details visit [email or web address].   Also download the MyChart app! Go to the app store, search "MyChart", open the app, select , and log in with your MyChart username and password.

## 2024-02-16 NOTE — Patient Instructions (Addendum)
 [facility] at [facility] Discharge Instructions   You were seen and examined today by Dr. Ellin Saba.  He reviewed the results of your lab work which are normal/stable.   We will proceed with your treatment today.   Increase gabapentin to 300 mg every night.   Return as scheduled.    Thank you for choosing [facility] at [facility] to provide your oncology and hematology care.  To afford each patient quality time with our provider, please arrive at least 15 minutes before your scheduled appointment time.   If you have a lab appointment with the Cancer Center please come in thru the Main Entrance and check in at the main information desk.  You need to re-schedule your appointment should you arrive 10 or more minutes late.  We strive to give you quality time with our providers, and arriving late affects you and other patients whose appointments are after yours.  Also, if you no show three or more times for appointments you may be dismissed from the clinic at the providers discretion.     Again, thank you for choosing [facility].  Our hope is that these requests will decrease the amount of time that you wait before being seen by our physicians.       _____________________________________________________________  Should you have questions after your visit to [facility], please contact our office at [phone] and follow the prompts.  Our office hours are 8:00 a.m. and 4:30 p.m. Monday - Friday.  Please note that voicemails left after 4:00 p.m. may not be returned until the following business day.  We are closed weekends and major holidays.  You do have access to a nurse 24-7, just call the main number to the clinic [phone] and do not press any options, hold on the line and a nurse will answer the phone.    For prescription refill requests, have your pharmacy contact our office and allow 72 hours.    Due to  Covid, you will need to wear a mask upon entering the hospital. If you do not have a mask, a mask will be given to you at the Main Entrance upon arrival. For doctor visits, patients may have 1 support person age 105 or older with them. For treatment visits, patients can not have anyone with them due to social distancing guidelines and our immunocompromised population.

## 2024-02-17 ENCOUNTER — Encounter: Payer: Self-pay | Admitting: Hematology

## 2024-02-17 NOTE — Progress Notes (Signed)
 [facility] CSW Progress Note  Visual merchandiser met with patient in infusion to answer questions regarding disability.  CSW outlined the criteria for social security disability which pt would not meet at this time.  Pt currently receives Medicaid, but has not applied for SSI.  CSW encouraged pt to do so as she is not eligible for any disability benefits through her employer and is without an income at this time.     Rachel Moulds, LCSW Clinical Social Worker [facility]    Patient is participating in a Managed Medicaid Plan:  Yes

## 2024-02-18 ENCOUNTER — Inpatient Hospital Stay: Payer: BC Managed Care – PPO

## 2024-03-01 ENCOUNTER — Inpatient Hospital Stay: Payer: BC Managed Care – PPO

## 2024-03-01 ENCOUNTER — Inpatient Hospital Stay: Admitting: Licensed Clinical Social Worker

## 2024-03-01 ENCOUNTER — Inpatient Hospital Stay: Payer: BC Managed Care – PPO | Attending: Hematology

## 2024-03-01 ENCOUNTER — Inpatient Hospital Stay: Payer: BC Managed Care – PPO | Admitting: Hematology

## 2024-03-01 VITALS — BP 134/83 | HR 94 | Temp 97.2°F | Resp 18

## 2024-03-01 VITALS — BP 151/87 | HR 100 | Temp 97.7°F | Resp 20 | Wt 187.4 lb

## 2024-03-01 DIAGNOSIS — D649 Anemia, unspecified: Secondary | ICD-10-CM | POA: Insufficient documentation

## 2024-03-01 DIAGNOSIS — D72829 Elevated white blood cell count, unspecified: Secondary | ICD-10-CM | POA: Diagnosis not present

## 2024-03-01 DIAGNOSIS — Z5111 Encounter for antineoplastic chemotherapy: Secondary | ICD-10-CM | POA: Insufficient documentation

## 2024-03-01 DIAGNOSIS — Z171 Estrogen receptor negative status [ER-]: Secondary | ICD-10-CM

## 2024-03-01 DIAGNOSIS — Z95828 Presence of other vascular implants and grafts: Secondary | ICD-10-CM

## 2024-03-01 DIAGNOSIS — Z5189 Encounter for other specified aftercare: Secondary | ICD-10-CM | POA: Diagnosis not present

## 2024-03-01 DIAGNOSIS — C50212 Malignant neoplasm of upper-inner quadrant of left female breast: Secondary | ICD-10-CM | POA: Diagnosis not present

## 2024-03-01 DIAGNOSIS — R7989 Other specified abnormal findings of blood chemistry: Secondary | ICD-10-CM | POA: Insufficient documentation

## 2024-03-01 DIAGNOSIS — G629 Polyneuropathy, unspecified: Secondary | ICD-10-CM | POA: Insufficient documentation

## 2024-03-01 DIAGNOSIS — C50812 Malignant neoplasm of overlapping sites of left female breast: Secondary | ICD-10-CM | POA: Insufficient documentation

## 2024-03-01 LAB — CBC WITH DIFFERENTIAL/PLATELET
Abs Immature Granulocytes: 0.6 10*3/uL — ABNORMAL HIGH (ref 0.00–0.07)
Band Neutrophils: 4 %
Basophils Absolute: 0.1 10*3/uL (ref 0.0–0.1)
Basophils Relative: 1 %
Eosinophils Absolute: 0 10*3/uL (ref 0.0–0.5)
Eosinophils Relative: 0 %
HCT: 27.7 % — ABNORMAL LOW (ref 36.0–46.0)
Hemoglobin: 9.2 g/dL — ABNORMAL LOW (ref 12.0–15.0)
Lymphocytes Relative: 3 %
Lymphs Abs: 0.4 10*3/uL — ABNORMAL LOW (ref 0.7–4.0)
MCH: 32.2 pg (ref 26.0–34.0)
MCHC: 33.2 g/dL (ref 30.0–36.0)
MCV: 96.9 fL (ref 80.0–100.0)
Metamyelocytes Relative: 2 %
Monocytes Absolute: 0.5 10*3/uL (ref 0.1–1.0)
Monocytes Relative: 4 %
Myelocytes: 3 %
Neutro Abs: 11.2 10*3/uL — ABNORMAL HIGH (ref 1.7–7.7)
Neutrophils Relative %: 83 %
Platelets: 234 10*3/uL (ref 150–400)
RBC: 2.86 MIL/uL — ABNORMAL LOW (ref 3.87–5.11)
RDW: 15.9 % — ABNORMAL HIGH (ref 11.5–15.5)
Smear Review: ADEQUATE
WBC: 12.9 10*3/uL — ABNORMAL HIGH (ref 4.0–10.5)
nRBC: 0.3 % — ABNORMAL HIGH (ref 0.0–0.2)

## 2024-03-01 LAB — COMPREHENSIVE METABOLIC PANEL WITH GFR
ALT: 26 U/L (ref 0–44)
AST: 26 U/L (ref 15–41)
Albumin: 4 g/dL (ref 3.5–5.0)
Alkaline Phosphatase: 75 U/L (ref 38–126)
Anion gap: 12 (ref 5–15)
BUN: 18 mg/dL (ref 6–20)
CO2: 22 mmol/L (ref 22–32)
Calcium: 9 mg/dL (ref 8.9–10.3)
Chloride: 105 mmol/L (ref 98–111)
Creatinine, Ser: 1.13 mg/dL — ABNORMAL HIGH (ref 0.44–1.00)
GFR, Estimated: 56 mL/min — ABNORMAL LOW (ref >60)
Glucose, Bld: 182 mg/dL — ABNORMAL HIGH (ref 70–99)
Potassium: 3.5 mmol/L (ref 3.5–5.1)
Sodium: 139 mmol/L (ref 135–145)
Total Bilirubin: 0.5 mg/dL (ref 0.0–1.2)
Total Protein: 7 g/dL (ref 6.5–8.1)

## 2024-03-01 LAB — MAGNESIUM: Magnesium: 1.6 mg/dL — ABNORMAL LOW (ref 1.7–2.4)

## 2024-03-01 MED ORDER — SODIUM CHLORIDE 0.9 % IV SOLN
600.0000 mg/m2 | Freq: Once | INTRAVENOUS | Status: AC
  Administered 2024-03-01: 1180 mg via INTRAVENOUS
  Filled 2024-03-01: qty 59

## 2024-03-01 MED ORDER — SODIUM CHLORIDE 0.9% FLUSH
10.0000 mL | INTRAVENOUS | Status: DC | PRN
  Administered 2024-03-01: 10 mL

## 2024-03-01 MED ORDER — PALONOSETRON HCL INJECTION 0.25 MG/5ML
0.2500 mg | Freq: Once | INTRAVENOUS | Status: AC
  Administered 2024-03-01: 0.25 mg via INTRAVENOUS
  Filled 2024-03-01: qty 5

## 2024-03-01 MED ORDER — SODIUM CHLORIDE 0.9% FLUSH
10.0000 mL | INTRAVENOUS | Status: DC | PRN
  Administered 2024-03-01: 10 mL via INTRAVENOUS

## 2024-03-01 MED ORDER — GABAPENTIN 300 MG PO CAPS: 300.0000 mg | ORAL_CAPSULE | Freq: Two times a day (BID) | ORAL | 2 refills | Status: DC

## 2024-03-01 MED ORDER — HEPARIN SOD (PORK) LOCK FLUSH 100 UNIT/ML IV SOLN
500.0000 [IU] | Freq: Once | INTRAVENOUS | Status: AC | PRN
Start: 2024-03-01 — End: 2024-03-01
  Administered 2024-03-01: 500 [IU]

## 2024-03-01 MED ORDER — MAGNESIUM SULFATE 2 GM/50ML IV SOLN
2.0000 g | Freq: Once | INTRAVENOUS | Status: AC
  Administered 2024-03-01: 2 g via INTRAVENOUS
  Filled 2024-03-01: qty 50

## 2024-03-01 MED ORDER — DEXAMETHASONE SODIUM PHOSPHATE 10 MG/ML IJ SOLN
10.0000 mg | Freq: Once | INTRAMUSCULAR | Status: AC
  Administered 2024-03-01: 10 mg via INTRAVENOUS
  Filled 2024-03-01: qty 1

## 2024-03-01 MED ORDER — PEGFILGRASTIM 6 MG/0.6ML ~~LOC~~ PSKT
6.0000 mg | PREFILLED_SYRINGE | Freq: Once | SUBCUTANEOUS | Status: AC
Start: 2024-03-01 — End: 2024-03-01
  Administered 2024-03-01: 6 mg via SUBCUTANEOUS
  Filled 2024-03-01: qty 0.6

## 2024-03-01 MED ORDER — SODIUM CHLORIDE 0.9 % IV SOLN
150.0000 mg | Freq: Once | INTRAVENOUS | Status: AC
  Administered 2024-03-01: 150 mg via INTRAVENOUS
  Filled 2024-03-01: qty 150

## 2024-03-01 MED ORDER — SODIUM CHLORIDE 0.9 % IV SOLN
INTRAVENOUS | Status: DC
Start: 2024-03-01 — End: 2024-03-01

## 2024-03-01 MED ORDER — DOXORUBICIN HCL CHEMO IV INJECTION 2 MG/ML
60.0000 mg/m2 | Freq: Once | INTRAVENOUS | Status: AC
  Administered 2024-03-01: 118 mg via INTRAVENOUS
  Filled 2024-03-01: qty 59

## 2024-03-01 NOTE — Progress Notes (Signed)
 Treatment given today per MD orders.  Tolerated infusion without adverse affects.  Vital signs stable.  Onpro place on the right arm verified blinking green light.  No complaints at this time.  Discharge from clinic ambulatory in stable condition.  Alert and oriented X 3.  Follow up with [facility] as scheduled.

## 2024-03-01 NOTE — Patient Instructions (Signed)
 [facility] [place] - A DEPT [facility]  Discharge Instructions: Thank you for choosing [facility] to provide your oncology and hematology care.  If you have a lab appointment with the Cancer Center - please note that after April 8th, 2024, all labs will be drawn in the cancer center.  You do not have to check in or register with the main entrance as you have in the past but will complete your check-in in the cancer center.  Wear comfortable clothing and clothing appropriate for easy access to any Portacath or PICC line.   We strive to give you quality time with your provider. You may need to reschedule your appointment if you arrive late (15 or more minutes).  Arriving late affects you and other patients whose appointments are after yours.  Also, if you miss three or more appointments without notifying the office, you may be dismissed from the clinic at the provider's discretion.      For prescription refill requests, have your pharmacy contact our office and allow 72 hours for refills to be completed.    Today you received the following chemotherapy and/or immunotherapy agents Adriamycin/Cytoxan.  Doxorubicin Injection What is this medication? DOXORUBICIN (dox oh ROO bi sin) treats some types of cancer. It works by slowing down the growth of cancer cells. This medicine may be used for other purposes; ask your health care provider or pharmacist if you have questions. COMMON BRAND NAME(S): Adriamycin, Adriamycin PFS, Adriamycin RDF, Rubex What should I tell my care team before I take this medication? They need to know if you have any of these conditions: Heart disease History of low blood cell levels caused by a medication Liver disease Recent or ongoing radiation An unusual or allergic reaction to doxorubicin, other medications, foods, dyes, or preservatives If you or your partner are pregnant or trying to get pregnant Breast-feeding How should I  use this medication? This medication is injected into a vein. It is given by your care team in a hospital or clinic setting. Talk to your care team about the use of this medication in children. Special care may be needed. Overdosage: If you think you have taken too much of this medicine contact a poison control center or emergency room at once. NOTE: This medicine is only for you. Do not share this medicine with others. What if I miss a dose? Keep appointments for follow-up doses. It is important not to miss your dose. Call your care team if you are unable to keep an appointment. What may interact with this medication? 6-mercaptopurine Paclitaxel Phenytoin St. John's wort Trastuzumab Verapamil This list may not describe all possible interactions. Give your health care provider a list of all the medicines, herbs, non-prescription drugs, or dietary supplements you use. Also tell them if you smoke, drink alcohol, or use illegal drugs. Some items may interact with your medicine. What should I watch for while using this medication? Your condition will be monitored carefully while you are receiving this medication. You may need blood work while taking this medication. This medication may make you feel generally unwell. This is not uncommon as chemotherapy can affect healthy cells as well as cancer cells. Report any side effects. Continue your course of treatment even though you feel ill unless your care team tells you to stop. There is a maximum amount of this medication you should receive throughout your life. The amount depends on the medical condition being treated and  your overall health. Your care team will watch how much of this medication you receive. Tell your care team if you have taken this medication before. Your urine may turn red for a few days after your dose. This is not blood. If your urine is dark or Magaline Steinberg, call your care team. In some cases, you may be given additional medications to  help with side effects. Follow all directions for their use. This medication may increase your risk of getting an infection. Call your care team for advice if you get a fever, chills, sore throat, or other symptoms of a cold or flu. Do not treat yourself. Try to avoid being around people who are sick. This medication may increase your risk to bruise or bleed. Call your care team if you notice any unusual bleeding. Talk to your care team about your risk of cancer. You may be more at risk for certain types of cancers if you take this medication. Talk to your care team if you or your partner may be pregnant. Serious birth defects can occur if you take this medication during pregnancy and for 6 months after the last dose. Contraception is recommended while taking this medication and for 6 months after the last dose. Your care team can help you find the option that works for you. If your partner can get pregnant, use a condom while taking this medication and for 6 months after the last dose. Do not breastfeed while taking this medication. This medication may cause infertility. Talk to your care team if you are concerned about your fertility. What side effects may I notice from receiving this medication? Side effects that you should report to your care team as soon as possible: Allergic reactions--skin rash, itching, hives, swelling of the face, lips, tongue, or throat Heart failure--shortness of breath, swelling of the ankles, feet, or hands, sudden weight gain, unusual weakness or fatigue Heart rhythm changes--fast or irregular heartbeat, dizziness, feeling faint or lightheaded, chest pain, trouble breathing Infection--fever, chills, cough, sore throat, wounds that don't heal, pain or trouble when passing urine, general feeling of discomfort or being unwell Low red blood cell level--unusual weakness or fatigue, dizziness, headache, trouble breathing Painful swelling, warmth, or redness of the skin,  blisters or sores at the infusion site Unusual bruising or bleeding Side effects that usually do not require medical attention (report to your care team if they continue or are bothersome): Diarrhea Hair loss Nausea Pain, redness, or swelling with sores inside the mouth or throat Red urine This list may not describe all possible side effects. Call your doctor for medical advice about side effects. You may report side effects to FDA at 1-800-FDA-1088. Where should I keep my medication? This medication is given in a hospital or clinic. It will not be stored at home. NOTE: This sheet is a summary. It may not cover all possible information. If you have questions about this medicine, talk to your doctor, pharmacist, or health care provider.  2024 Elsevier/Gold Standard (2023-02-18 00:00:00)   Cyclophosphamide Injection What is this medication? CYCLOPHOSPHAMIDE (sye kloe FOSS fa mide) treats some types of cancer. It works by slowing down the growth of cancer cells. This medicine may be used for other purposes; ask your health care provider or pharmacist if you have questions. COMMON BRAND NAME(S): Cyclophosphamide, Cytoxan, Neosar What should I tell my care team before I take this medication? They need to know if you have any of these conditions: Heart disease Irregular heartbeat or rhythm Infection  Kidney problems Liver disease Low blood cell levels (white cells, platelets, or red blood cells) Lung disease Previous radiation Trouble passing urine An unusual or allergic reaction to cyclophosphamide, other medications, foods, dyes, or preservatives Pregnant or trying to get pregnant Breast-feeding How should I use this medication? This medication is injected into a vein. It is given by your care team in a hospital or clinic setting. Talk to your care team about the use of this medication in children. Special care may be needed. Overdosage: If you think you have taken too much of this  medicine contact a poison control center or emergency room at once. NOTE: This medicine is only for you. Do not share this medicine with others. What if I miss a dose? Keep appointments for follow-up doses. It is important not to miss your dose. Call your care team if you are unable to keep an appointment. What may interact with this medication? Amphotericin B Amiodarone Azathioprine Certain antivirals for HIV or hepatitis Certain medications for blood pressure, such as enalapril, lisinopril, quinapril Cyclosporine Diuretics Etanercept Indomethacin Medications that relax muscles Metronidazole Natalizumab Tamoxifen Warfarin This list may not describe all possible interactions. Give your health care provider a list of all the medicines, herbs, non-prescription drugs, or dietary supplements you use. Also tell them if you smoke, drink alcohol, or use illegal drugs. Some items may interact with your medicine. What should I watch for while using this medication? This medication may make you feel generally unwell. This is not uncommon as chemotherapy can affect healthy cells as well as cancer cells. Report any side effects. Continue your course of treatment even though you feel ill unless your care team tells you to stop. You may need blood work while you are taking this medication. This medication may increase your risk of getting an infection. Call your care team for advice if you get a fever, chills, sore throat, or other symptoms of a cold or flu. Do not treat yourself. Try to avoid being around people who are sick. Avoid taking medications that contain aspirin, acetaminophen, ibuprofen, naproxen, or ketoprofen unless instructed by your care team. These medications may hide a fever. Be careful brushing or flossing your teeth or using a toothpick because you may get an infection or bleed more easily. If you have any dental work done, tell your dentist you are receiving this medication. Drink  water or other fluids as directed. Urinate often, even at night. Some products may contain alcohol. Ask your care team if this medication contains alcohol. Be sure to tell all care teams you are taking this medicine. Certain medicines, like metronidazole and disulfiram, can cause an unpleasant reaction when taken with alcohol. The reaction includes flushing, headache, nausea, vomiting, sweating, and increased thirst. The reaction can last from 30 minutes to several hours. Talk to your care team if you wish to become pregnant or think you might be pregnant. This medication can cause serious birth defects if taken during pregnancy and for 1 year after the last dose. A negative pregnancy test is required before starting this medication. A reliable form of contraception is recommended while taking this medication and for 1 year after the last dose. Talk to your care team about reliable forms of contraception. Do not father a child while taking this medication and for 4 months after the last dose. Use a condom during this time period. Do not breast-feed while taking this medication or for 1 week after the last dose. This medication may cause  infertility. Talk to your care team if you are concerned about your fertility. Talk to your care team about your risk of cancer. You may be more at risk for certain types of cancer if you take this medication. What side effects may I notice from receiving this medication? Side effects that you should report to your care team as soon as possible: Allergic reactions--skin rash, itching, hives, swelling of the face, lips, tongue, or throat Dry cough, shortness of breath or trouble breathing Heart failure--shortness of breath, swelling of the ankles, feet, or hands, sudden weight gain, unusual weakness or fatigue Heart muscle inflammation--unusual weakness or fatigue, shortness of breath, chest pain, fast or irregular heartbeat, dizziness, swelling of the ankles, feet, or  hands Heart rhythm changes--fast or irregular heartbeat, dizziness, feeling faint or lightheaded, chest pain, trouble breathing Infection--fever, chills, cough, sore throat, wounds that don't heal, pain or trouble when passing urine, general feeling of discomfort or being unwell Kidney injury--decrease in the amount of urine, swelling of the ankles, hands, or feet Liver injury--right upper belly pain, loss of appetite, nausea, light-colored stool, dark yellow or Janathan Bribiesca urine, yellowing skin or eyes, unusual weakness or fatigue Low red blood cell level--unusual weakness or fatigue, dizziness, headache, trouble breathing Low sodium level--muscle weakness, fatigue, dizziness, headache, confusion Red or dark Kajuan Guyton urine Unusual bruising or bleeding Side effects that usually do not require medical attention (report to your care team if they continue or are bothersome): Hair loss Irregular menstrual cycles or spotting Loss of appetite Nausea Pain, redness, or swelling with sores inside the mouth or throat Vomiting This list may not describe all possible side effects. Call your doctor for medical advice about side effects. You may report side effects to FDA at 1-800-FDA-1088. Where should I keep my medication? This medication is given in a hospital or clinic. It will not be stored at home. NOTE: This sheet is a summary. It may not cover all possible information. If you have questions about this medicine, talk to your doctor, pharmacist, or health care provider.  2024 Elsevier/Gold Standard (2022-04-03 00:00:00)       To help prevent nausea and vomiting after your treatment, we encourage you to take your nausea medication as directed.  BELOW ARE SYMPTOMS THAT SHOULD BE REPORTED IMMEDIATELY: *FEVER GREATER THAN 100.4 F (38 C) OR HIGHER *CHILLS OR SWEATING *NAUSEA AND VOMITING THAT IS NOT CONTROLLED WITH YOUR NAUSEA MEDICATION *UNUSUAL SHORTNESS OF BREATH *UNUSUAL BRUISING OR BLEEDING *URINARY  PROBLEMS (pain or burning when urinating, or frequent urination) *BOWEL PROBLEMS (unusual diarrhea, constipation, pain near the anus) TENDERNESS IN MOUTH AND THROAT WITH OR WITHOUT PRESENCE OF ULCERS (sore throat, sores in mouth, or a toothache) UNUSUAL RASH, SWELLING OR PAIN  UNUSUAL VAGINAL DISCHARGE OR ITCHING   Items with * indicate a potential emergency and should be followed up as soon as possible or go to the Emergency Department if any problems should occur.  Please show the CHEMOTHERAPY ALERT CARD or IMMUNOTHERAPY ALERT CARD at check-in to the Emergency Department and triage nurse.  Should you have questions after your visit or need to cancel or reschedule your appointment, please contact [facility] [place] - A DEPT OF Eligha Brideg[facility] [phone]  and follow the prompts.  Office hours are 8:00 a.m. to 4:30 p.m. Monday - Friday. Please note that voicemails left after 4:00 p.m. may not be returned until the following business day.  We are closed weekends and major holidays. You have access to  a nurse at all times for urgent questions. Please call the main number to the clinic [phone] and follow the prompts.  For any non-urgent questions, you may also contact your provider using MyChart. We now offer e-Visits for anyone 76 and older to request care online for non-urgent symptoms. For details visit [email or web address].   Also download the MyChart app! Go to the app store, search "MyChart", open the app, select [place], and log in with your MyChart username and password.

## 2024-03-01 NOTE — Progress Notes (Signed)
 Patient has been examined by Dr. Ellin Saba. Vital signs and labs have been reviewed by MD - ANC, Creatinine, LFTs, hemoglobin, and platelets are within treatment parameters per M.D. - pt may proceed with treatment.  Primary RN and pharmacy notified.

## 2024-03-01 NOTE — Patient Instructions (Signed)
 [facility] at [facility] Discharge Instructions   You were seen and examined today by Dr. Ellin Saba.  He reviewed the results of your lab work which are normal/stable.   We will proceed with your treatment today.   Increase the gabapentin to 300 mg twice a day.   Return as scheduled.    Thank you for choosing [facility] at [facility] to provide your oncology and hematology care.  To afford each patient quality time with our provider, please arrive at least 15 minutes before your scheduled appointment time.   If you have a lab appointment with the Cancer Center please come in thru the Main Entrance and check in at the main information desk.  You need to re-schedule your appointment should you arrive 10 or more minutes late.  We strive to give you quality time with our providers, and arriving late affects you and other patients whose appointments are after yours.  Also, if you no show three or more times for appointments you may be dismissed from the clinic at the providers discretion.     Again, thank you for choosing [facility].  Our hope is that these requests will decrease the amount of time that you wait before being seen by our physicians.       _____________________________________________________________  Should you have questions after your visit to [facility], please contact our office at [phone] and follow the prompts.  Our office hours are 8:00 a.m. and 4:30 p.m. Monday - Friday.  Please note that voicemails left after 4:00 p.m. may not be returned until the following business day.  We are closed weekends and major holidays.  You do have access to a nurse 24-7, just call the main number to the clinic [phone] and do not press any options, hold on the line and a nurse will answer the phone.    For prescription refill requests, have your pharmacy contact our office and allow 72 hours.    Due to  Covid, you will need to wear a mask upon entering the hospital. If you do not have a mask, a mask will be given to you at the Main Entrance upon arrival. For doctor visits, patients may have 1 support person age 66 or older with them. For treatment visits, patients can not have anyone with them due to social distancing guidelines and our immunocompromised population.

## 2024-03-01 NOTE — Progress Notes (Signed)
 Patients port flushed without difficulty.  Good blood return noted with no bruising or swelling noted at site.  Patient remains accessed for treatment.

## 2024-03-01 NOTE — Progress Notes (Signed)
 [facility] [address], [place] [address]   Clinic Day:  03/01/2024  Referring physician: Kerri Perches, MD  Patient Care Team: Kerri Perches, MD as PCP - General   ASSESSMENT & PLAN:   Assessment:  1.  T1 cN0 G3 ER/PR/HER2-left breast IDC: - Screening mammogram on 10/20/2022: Abnormal - Left breast diagnostic mammogram/ultrasound (10/26/2023): Linear oriented pleomorphic calcifications in the lower inner left breast span 5.9 cm.  There has been interval development of irregular mass associated with these calcifications in the lower inner left breast.  Ultrasound of the left breast shows irregular hypoechoic mass in the left breast at 9 o'clock position measuring 2 x 1.3 x 1.6 cm.  No abnormal lymph nodes in the left axilla. - Left breast 9:00 biopsy (11/02/2023): Poorly differentiated IDC, grade 3, ER/PR negative, HER2 (1+) by IHC.  Ki-67: 20%. - Left breast biopsy LIQ posterior extent and anterior extent (11/17/2023): DCIS, comedo, high nuclear grade. - PET scan (11/25/2023): No evidence of metastatic disease. - Left mastectomy and SLNB on 12/08/2023 by Dr. Henreitta Leber - Pathology: 3.9 x 2.2 x 1.5 cm invasive poorly differentiated ductal adenocarcinoma, grade 3 with extensive necrosis, high-grade DCIS with necrosis, margins free, negative angiolymphatic invasion.  0/6 lymph nodes involved.  pT2 pN0. - Adjuvant chemotherapy with dose dense AC x 4 followed by weekly paclitaxel x 12 was recommended. - 4 cycles of dose dense AC from 01/19/2024 through 03/01/2024 - Weekly paclitaxel started on  2. Social/Family History: -Lives at home with her daughter. Works full-time with mild physical activity at work. No tobacco use.  -Brother died of lung cancer in his early 44's with a history of tobacco use. No other family history of cancer.  3. BRIP1 heterozygosity: - We reviewed genetic testing results which was positive for BRIP1 mutation, putting her at high risk for  ovarian cancer.  Also at high risk for Fanconi anemia. - She had unilateral salpingo-oophorectomy more than 10 years ago.  I have recommended salpingo-oophorectomy of the remaining ovary.   also recommended that her daughters be tested for the same mutation.  Plan:  1.  T1 cN0 G3 triple negative left breast IDC: - She has tolerated cycle 3 reasonably well.  However she had nausea lasting all 2 weeks intermittently which responded well to Compazine.  Denied any vomiting. - She had diarrhea for couple of days and took Pepto-Bismol which helped. - Reviewed labs today: Normal LFTs.  Creatinine 1.13 and stable.  CBC grossly within normal limits.  She may proceed with cycle 4 of dose dense AC today.  RTC 2 weeks for follow-up to initiate weekly paclitaxel.  She reports that she is going out of town 6/16 through 6/18  2.  Peripheral neuropathy: - She has numbness/tingling and burning sensation in the feet which is worse at nighttime.  She is prediabetic.  Symptoms were present prior to start of therapy. - I have increased her gabapentin dose to 300 mg at bedtime at last visit.  She did not report any significant improvement. - Will increase gabapentin to 300 mg twice daily and if well-tolerated, to 3 times daily.  3.  High risk drug monitoring: - 2D echo on 11/23/2023 with LVEF 55%, grade 1 diastolic dysfunction.  No signs or symptoms of PND or orthopnea.  4.  Hypomagnesemia: - I started her on magnesium once daily.  She cannot take it regularly as it makes her sick.  Today magnesium is 1.6.  She will receive  2 g IV magnesium.   Orders Placed This Encounter  Procedures   Magnesium    Standing Status:   Future    Expected Date:   04/05/2024    Expiration Date:   04/05/2025   CBC with Differential    Standing Status:   Future    Expected Date:   04/05/2024    Expiration Date:   04/05/2025   Comprehensive metabolic panel    Standing Status:   Future    Expected Date:   04/05/2024    Expiration Date:    04/05/2025   Magnesium    Standing Status:   Future    Expected Date:   04/12/2024    Expiration Date:   04/12/2025   CBC with Differential    Standing Status:   Future    Expected Date:   04/12/2024    Expiration Date:   04/12/2025   Comprehensive metabolic panel    Standing Status:   Future    Expected Date:   04/12/2024    Expiration Date:   04/12/2025   Magnesium    Standing Status:   Future    Expected Date:   04/19/2024    Expiration Date:   04/19/2025   CBC with Differential    Standing Status:   Future    Expected Date:   04/19/2024    Expiration Date:   04/19/2025   Comprehensive metabolic panel    Standing Status:   Future    Expected Date:   04/19/2024    Expiration Date:   04/19/2025   Magnesium    Standing Status:   Future    Expected Date:   04/26/2024    Expiration Date:   04/26/2025   CBC with Differential    Standing Status:   Future    Expected Date:   04/26/2024    Expiration Date:   04/26/2025   Comprehensive metabolic panel    Standing Status:   Future    Expected Date:   04/26/2024    Expiration Date:   04/26/2025   Magnesium    Standing Status:   Future    Expected Date:   05/03/2024    Expiration Date:   05/03/2025   CBC with Differential    Standing Status:   Future    Expected Date:   05/03/2024    Expiration Date:   05/03/2025   Comprehensive metabolic panel    Standing Status:   Future    Expected Date:   05/03/2024    Expiration Date:   05/03/2025   Magnesium    Standing Status:   Future    Expected Date:   05/10/2024    Expiration Date:   05/10/2025   CBC with Differential    Standing Status:   Future    Expected Date:   05/10/2024    Expiration Date:   05/10/2025   Comprehensive metabolic panel    Standing Status:   Future    Expected Date:   05/10/2024    Expiration Date:   05/10/2025   Magnesium    Standing Status:   Future    Expected Date:   05/17/2024    Expiration Date:   05/17/2025   CBC with Differential    Standing Status:   Future    Expected Date:    05/17/2024    Expiration Date:   05/17/2025   Comprehensive metabolic panel    Standing Status:   Future    Expected Date:  05/17/2024    Expiration Date:   05/17/2025   Magnesium    Standing Status:   Future    Expected Date:   05/24/2024    Expiration Date:   05/24/2025   CBC with Differential    Standing Status:   Future    Expected Date:   05/24/2024    Expiration Date:   05/24/2025   Comprehensive metabolic panel    Standing Status:   Future    Expected Date:   05/24/2024    Expiration Date:   05/24/2025   Magnesium    Standing Status:   Future    Expected Date:   05/31/2024    Expiration Date:   05/31/2025   CBC with Differential    Standing Status:   Future    Expected Date:   05/31/2024    Expiration Date:   05/31/2025   Comprehensive metabolic panel    Standing Status:   Future    Expected Date:   05/31/2024    Expiration Date:   05/31/2025      Mikeal Hawthorne R Teague,acting as a scribe for Doreatha Massed, MD.,have documented all relevant documentation on the behalf of Doreatha Massed, MD,as directed by  Doreatha Massed, MD while in the presence of Doreatha Massed, MD.  I, Doreatha Massed MD, have reviewed the above documentation for accuracy and completeness, and I agree with the above.      Doreatha Massed, MD   4/2/20253:54 PM  CHIEF COMPLAINT/PURPOSE OF CONSULT:   Diagnosis: Triple negative breast cancer of upper-inner quadrant of left female breast   Cancer Staging  Breast cancer of upper-inner quadrant of left female breast [facility]) Staging form: Breast, AJCC 8th Edition - Clinical stage from 11/15/2023: Stage IIB (cT2, cN0(sn), cM0, G3, ER-, PR-, HER2-) - Signed by Doreatha Massed, MD on 01/05/2024    Prior Therapy: Left mastectomy and SLNB  Current Therapy: Dose dense AC followed by weekly paclitaxel   HISTORY OF PRESENT ILLNESS:   Oncology History  Breast cancer of upper-inner quadrant of left female breast (HCC)  11/15/2023 Initial  Diagnosis   Breast cancer of upper-inner quadrant of left female breast (HCC)    Genetic Testing   Likely pathogenic variant in BRIP1 called c.3196del (p.Ser1066Hisfs*12) identified on the [facility] Common Hereditary Cancers+RNA panel. VUS in CTNNA1 called c.1070G>A (p.Arg357His) identified. The report date is 11/29/2023.  The Common Hereditary Cancers Panel + RNA offered by [facility] includes sequencing and/or deletion duplication testing of the following 48 genes: APC*, ATM*, AXIN2, BAP1, BARD1, BMPR1A, BRCA1, BRCA2, BRIP1, CDH1, CDK4, CDKN2A (p14ARF), CDKN2A (p16INK4a), CHEK2, CTNNA1, DICER1*, EPCAM*, FH*, GREM1*, HOXB13, KIT, MBD4, MEN1*, MLH1*, MSH2*, MSH3*, MSH6*, MUTYH, NF1*, NTHL1, PALB2, PDGFRA, PMS2*, POLD1*, POLE, PTEN*, RAD51C, RAD51D, SDHA*, SDHB, SDHC*, SDHD, SMAD4, SMARCA4, STK11, TP53, TSC1*, TSC2, VHL.    11/15/2023 Cancer Staging   Staging form: Breast, AJCC 8th Edition - Clinical stage from 11/15/2023: Stage IIB (cT2, cN0(sn), cM0, G3, ER-, PR-, HER2-) - Signed by Doreatha Massed, MD on 01/05/2024 Histopathologic type: Infiltrating duct carcinoma, NOS Stage prefix: Initial diagnosis Method of lymph node assessment: Sentinel lymph node biopsy Nuclear grade: G3 Histologic grading system: 3 grade system   01/19/2024 -  Chemotherapy   Patient is on Treatment Plan : BREAST DOSE DENSE AC q14d / PACLitaxel q7d         Meredith Sanchez is a 58 y.o. female presenting to clinic today for evaluation of left breast cancer at the request of Kerri Perches, MD.  Patient underwent a  US of the left breast and diagnostic bilateral MM on 10/26/23 after her last visit with her PCP that found: a highly suspicious 2 cm mass in the left breast at the 9 o'clock position with associated linear oriented calcifications, the mass with calcifications span 5.9 cm. She then had a left breast biopsy on 11/02/23 with pathology revealing: invasive poorly differentiated ductal adenocarcinoma, grade 3 with  extensive necrosis and the tumor measuring 6.5 mm in greatest linear extent. The biopsy was negative for angiolymphatic invasion and microcalcifications. The tumor cells are negative for HER2 (1+), ER (0%), and PR (0%).   Today, she states that she is doing well overall. Her appetite level is at 100%. Her energy level is at 85%. She is accompanied by her sister.   She has an appointment with Dr. Henreitta Leber this afternoon and has 2 additional left breast biopsies scheduled tomorrow. Prior to MM she had not felt any abnormalities on the breasts. She does have a history of right breast biopsy that was benign in 2018. She had menarche at age 21 and a total hysterectomy with appendectomy at 47 for fibroids in uterus. She denies any hot flashes or other menopausal symptoms. She has 3 daughters with her first childbirth at 51. She has used birth control for less than a year and never taken hormone replacements. She denies any new pains in the last few months. She does note tingling and numbness in the bilateral toes at night for the past 5 years after an MI in 2019. She does not have diabetes. She reports intermittent ankle swellings that she treats with compression socks.   INTERVAL HISTORY:   Meredith Sanchez is a 58 y.o. female presenting to the clinic today for follow-up of Triple negative breast cancer of upper-inner quadrant of left female breast. She was last seen by me on 02/16/24.  Today, she states that she is doing well overall. Her appetite level is at 50%. Her energy level is at 50%.   PAST MEDICAL HISTORY:   Past Medical History: Past Medical History:  Diagnosis Date   Allergic rhinitis    Family history of breast cancer    Hyperlipidemia    Hypertension    Myocardial infarction [facility])    Obesity     Surgical History: Past Surgical History:  Procedure Laterality Date   ABDOMINAL HYSTERECTOMY     APPENDECTOMY  11/30/2001   BREAST BIOPSY Left 11/02/2023   path pending   BREAST BIOPSY  Left 11/02/2023   [place] LT BREAST BX W LOC DEV 1ST LESION IMG BX SPEC US GUIDE 11/02/2023 [facility]OUND   BREAST BIOPSY Left 11/17/2023   MM LT BREAST BX W LOC DEV 1ST LESION IMAGE BX SPEC STEREO GUIDE 11/17/2023 [facility] MAMMOGRAPHY   BREAST BIOPSY Left 11/17/2023   MM LT BREAST BX W LOC DEV EA AD LESION IMG BX SPEC STEREO GUIDE 11/17/2023 [facility] M[facility]   COLONOSCOPY N/A 08/20/2014   Procedure: COLONOSCOPY;  Surgeon: West Bali, MD;  Location: [facility] ENDO SUITE;  Service: Endoscopy;  Laterality: N/A;  2:15pm   LEFT OOPHORECTOMY  11/30/2001   PORTACATH PLACEMENT Right 12/08/2023   Procedure: INSERTION SUBCLAVIAN PORT-A-CATH;  Surgeon: Lucretia Roers, MD;  Location: [facility] ORS;  Service: General;  Laterality: Right;   SIMPLE MASTECTOMY WITH AXILLARY SENTINEL NODE BIOPSY Left 12/08/2023   Procedure: SIMPLE MASTECTOMY WITH AXILLARY SENTINEL NODE BIOPSY;  Surgeon: Lucretia Roers, MD;  Location: [facility] ORS;  Service: General;  Laterality: Left;   TUBAL LIGATION  VESICOVAGINAL FISTULA CLOSURE W/ TAH  11/30/2001    Social History: Social History   Socioeconomic History   Marital status: Married    Spouse name: Not on file   Number of children: 3   Years of education: Not on file   Highest education level: Not on file  Occupational History   Occupation: employed     Comment: [employer]  Tobacco Use   Smoking status: Never   Smokeless tobacco: Never  Vaping Use   Vaping status: Never Used  Substance and Sexual Activity   Alcohol use: No   Drug use: No   Sexual activity: Not on file  Other Topics Concern   Not on file  Social History Narrative   Not on file   Social Drivers of Health   Financial Resource Strain: High Risk (01/19/2024)   Overall Financial Resource Strain (CARDIA)    Difficulty of Paying Living Expenses: Very hard  Food Insecurity: Patient Declined (12/08/2023)   Hunger Vital Sign    Worried About Running Out of Food in the Last Year: Patient declined    Ran Out of  Food in the Last Year: Patient declined  Transportation Needs: Patient Declined (12/08/2023)   PRAPARE - Administrator, Civil Service (Medical): Patient declined    Lack of Transportation (Non-Medical): Patient declined  Physical Activity: Inactive (12/08/2018)   Received from [facility], [facility]   Exercise Vital Sign    Days of Exercise per Week: 0 days    Minutes of Exercise per Session: 0 min  Stress: Not on file  Social Connections: Not on file  Intimate Partner Violence: Patient Declined (12/08/2023)   Humiliation, Afraid, Rape, and Kick questionnaire    Fear of Current or Ex-Partner: Patient declined    Emotionally Abused: Patient declined    Physically Abused: Patient declined    Sexually Abused: Patient declined    Family History: Family History  Problem Relation Age of Onset   Diabetes Mother    Hypertension Mother    Hypertension Father    Lung cancer Brother 20       metastatic lung cancer to brain   Breast cancer Cousin 77       pat first cousin   Colon cancer Neg Hx     Current Medications:  Current Outpatient Medications:    amlodipine-olmesartan (AZOR) 10-20 MG tablet, TAKE 1 TABLET BY MOUTH DAILY, Disp: 90 tablet, Rfl: 3   APPLE CIDER VINEGAR PO, Take 1 capsule by mouth daily., Disp: , Rfl:    Ascorbic Acid (VITAMIN C PO), Take 1 capsule by mouth daily., Disp: , Rfl:    ASHWAGANDHA PO, Take 2 tablets by mouth daily., Disp: , Rfl:    ASPIRIN 81 PO, Take 1 tablet by mouth daily., Disp: , Rfl:    atorvastatin (LIPITOR) 40 MG tablet, TAKE 1 TABLET(40 MG) BY MOUTH DAILY, Disp: 90 tablet, Rfl: 3   azelastine (ASTELIN) 0.1 % nasal spray, Place 2 sprays into both nostrils 2 (two) times daily., Disp: , Rfl:    CYCLOPHOSPHAMIDE IV, Inject into the vein., Disp: , Rfl:    docusate sodium (COLACE) 100 MG capsule, Take 1 capsule (100 mg total) by mouth 2 (two) times daily as needed for mild constipation., Disp: 30 capsule, Rfl: 0   DOXOrubicin HCl  (ADRIAMYCIN IV), Inject into the vein., Disp: , Rfl:    fluticasone (FLONASE) 50 MCG/ACT nasal spray, Place 2 sprays into both nostrils daily., Disp: , Rfl:  lidocaine-prilocaine (EMLA) cream, Apply to affected area once, Disp: 30 g, Rfl: 3   magnesium oxide (MAG-OX) 400 (240 Mg) MG tablet, Take 1 tablet (400 mg total) by mouth daily., Disp: 30 tablet, Rfl: 3   Multiple Vitamin (MULTIVITAMIN) tablet, Take 1 tablet by mouth daily., Disp: , Rfl:    ondansetron (ZOFRAN-ODT) 4 MG disintegrating tablet, Take 1 tablet (4 mg total) by mouth every 6 (six) hours as needed for nausea., Disp: 20 tablet, Rfl: 0   oxyCODONE (OXY IR/ROXICODONE) 5 MG immediate release tablet, Take 1 tablet (5 mg total) by mouth every 12 (twelve) hours as needed for severe pain (pain score 7-10) or breakthrough pain., Disp: 8 tablet, Rfl: 0   PACLitaxel (TAXOL IV), Inject into the vein., Disp: , Rfl:    Pegfilgrastim (NEULASTA [place]), Inject into the skin., Disp: , Rfl:    prochlorperazine (COMPAZINE) 10 MG tablet, Take 1 tablet (10 mg total) by mouth every 6 (six) hours as needed for nausea or vomiting., Disp: 60 tablet, Rfl: 3   triamterene-hydrochlorothiazide (MAXZIDE-25) 37.5-25 MG tablet, TAKE 1 TABLET BY MOUTH DAILY, Disp: 90 tablet, Rfl: 3   gabapentin (NEURONTIN) 300 MG capsule, Take 1 capsule (300 mg total) by mouth 2 (two) times daily., Disp: 60 capsule, Rfl: 2 No current facility-administered medications for this visit.  Facility-Administered Medications Ordered in Other Visits:    0.9 %  sodium chloride infusion, , Intravenous, Continuous, Doreatha Massed, MD, Last Rate: 10 mL/hr at 03/01/24 1410, New Bag at 03/01/24 1410   cyclophosphamide (CYTOXAN) 1,180 mg in sodium chloride 0.9 % 250 mL chemo infusion, 600 mg/m2 (Treatment Plan Recorded), Intravenous, Once, Doreatha Massed, MD   heparin lock flush 100 unit/mL, 500 Units, Intracatheter, Once PRN, Doreatha Massed, MD   magnesium sulfate IVPB 2 g 50 mL,  2 g, Intravenous, Once, Doreatha Massed, MD, Last Rate: 50 mL/hr at 03/01/24 1455, 2 g at 03/01/24 1455   pegfilgrastim (NEULASTA ONPRO KIT) injection 6 mg, 6 mg, Subcutaneous, Once, Doreatha Massed, MD   sodium chloride flush (NS) 0.9 % injection 10 mL, 10 mL, Intracatheter, PRN, Doreatha Massed, MD   Allergies: Allergies  Allergen Reactions   Ace Inhibitors Shortness Of Breath    Roof of mouth  Tingling, throat closing   Penicillins     Yeast infections    Poison Ivy Extract Rash    REVIEW OF SYSTEMS:   Review of Systems  Constitutional:  Negative for chills, fatigue and fever.  HENT:   Negative for lump/mass, mouth sores, nosebleeds, sore throat and trouble swallowing.   Eyes:  Negative for eye problems.  Respiratory:  Negative for cough and shortness of breath.   Cardiovascular:  Negative for chest pain, leg swelling and palpitations.  Gastrointestinal:  Positive for diarrhea and nausea. Negative for abdominal pain, constipation and vomiting.  Genitourinary:  Negative for bladder incontinence, difficulty urinating, dysuria, frequency, hematuria and nocturia.   Musculoskeletal:  Negative for arthralgias, back pain, flank pain, myalgias and neck pain.  Skin:  Negative for itching and rash.  Neurological:  Positive for dizziness and headaches. Negative for numbness.  Hematological:  Does not bruise/bleed easily.  Psychiatric/Behavioral:  Negative for depression, sleep disturbance and suicidal ideas. The patient is not nervous/anxious.   All other systems reviewed and are negative.    VITALS:   Blood pressure (!) 151/87, pulse 100, temperature 97.7 F (36.5 C), temperature source Oral, resp. rate 20, weight 187 lb 6.3 oz (85 kg), SpO2 100%.  Wt Readings from Last 3  Encounters:  03/01/24 187 lb 6.3 oz (85 kg)  02/16/24 184 lb 11.9 oz (83.8 kg)  02/02/24 189 lb (85.7 kg)    Body mass index is 30.25 kg/m.  Performance status (ECOG): 1 - Symptomatic but  completely ambulatory  PHYSICAL EXAM:   Physical Exam Vitals and nursing note reviewed. Exam conducted with a chaperone present.  Constitutional:      Appearance: Normal appearance.  Cardiovascular:     Rate and Rhythm: Normal rate and regular rhythm.     Pulses: Normal pulses.     Heart sounds: Normal heart sounds.  Pulmonary:     Effort: Pulmonary effort is normal.     Breath sounds: Normal breath sounds.  Abdominal:     Palpations: Abdomen is soft. There is no hepatomegaly, splenomegaly or mass.     Tenderness: There is no abdominal tenderness.  Musculoskeletal:     Right lower leg: No edema.     Left lower leg: No edema.  Lymphadenopathy:     Cervical: No cervical adenopathy.     Right cervical: No superficial, deep or posterior cervical adenopathy.    Left cervical: No superficial, deep or posterior cervical adenopathy.     Upper Body:     Right upper body: No supraclavicular or axillary adenopathy.     Left upper body: No supraclavicular or axillary adenopathy.  Neurological:     General: No focal deficit present.     Mental Status: She is alert and oriented to person, place, and time.  Psychiatric:        Mood and Affect: Mood normal.        Behavior: Behavior normal.   Breast Exam Chaperone: Kennith Gain, RN   LABS:      Latest Ref Rng & Units 03/01/2024   12:08 PM 02/16/2024   12:09 PM 02/02/2024   11:51 AM  CBC  WBC 4.0 - 10.5 K/uL 12.9  13.5  5.5   Hemoglobin 12.0 - 15.0 g/dL 9.2  16.1  09.6   Hematocrit 36.0 - 46.0 % 27.7  31.5  33.2   Platelets 150 - 400 K/uL 234  235  186       Latest Ref Rng & Units 03/01/2024   12:08 PM 02/16/2024   12:09 PM 02/02/2024   11:51 AM  CMP  Glucose 70 - 99 mg/dL 045  409  811   BUN 6 - 20 mg/dL 18  17  20    Creatinine 0.44 - 1.00 mg/dL 9.14  7.82  9.56   Sodium 135 - 145 mmol/L 139  137  136   Potassium 3.5 - 5.1 mmol/L 3.5  3.2  3.6   Chloride 98 - 111 mmol/L 105  99  99   CO2 22 - 32 mmol/L 22  22  23    Calcium 8.9  - 10.3 mg/dL 9.0  9.4  9.2   Total Protein 6.5 - 8.1 g/dL 7.0  7.2  7.2   Total Bilirubin 0.0 - 1.2 mg/dL 0.5  0.5  0.5   Alkaline Phos 38 - 126 U/L 75  72  61   AST 15 - 41 U/L 26  28  41   ALT 0 - 44 U/L 26  27  45      No results found for: "CEA1", "CEA" / No results found for: "CEA1", "CEA" No results found for: "PSA1" No results found for: "OZH086" No results found for: "CAN125"  No results found for: "TOTALPROTELP", "ALBUMINELP", "A1GS", "A2GS", "BETS", "BETA2SER", "  GAMS", "MSPIKE", "SPEI" Lab Results  Component Value Date   TIBC 344 08/17/2014   FERRITIN 348 (H) 08/17/2014   FERRITIN 134 07/09/2014   IRONPCTSAT 28 08/17/2014   No results found for: "LDH"   STUDIES:   No results found.

## 2024-03-01 NOTE — Progress Notes (Signed)
Patient presents today for chemotherapy infusion. Patient is in satisfactory condition with no new complaints voiced.  Vital signs are stable.  Labs reviewed by Dr. Ellin Saba during the office visit and all labs are within treatment parameters.  Magnesium today is 1.6.  We will give magnesium sulfate 2 grams IV x one dose today per standing orders by Dr. Ellin Saba.  We will proceed with treatment per MD orders.

## 2024-03-03 ENCOUNTER — Inpatient Hospital Stay: Payer: BC Managed Care – PPO

## 2024-03-07 ENCOUNTER — Other Ambulatory Visit: Payer: Self-pay

## 2024-03-14 NOTE — Progress Notes (Signed)
 [facility] [address], [place] [address]    Clinic Day:  03/15/2024  Referring physician: Kerri Perches, MD  Patient Care Team: Kerri Perches, MD as PCP - General   ASSESSMENT & PLAN:   Assessment: 1.  T1 cN0 G3 ER/PR/HER2-left breast IDC: - Screening mammogram on 10/20/2022: Abnormal - Left breast diagnostic mammogram/ultrasound (10/26/2023): Linear oriented pleomorphic calcifications in the lower inner left breast span 5.9 cm.  There has been interval development of irregular mass associated with these calcifications in the lower inner left breast.  Ultrasound of the left breast shows irregular hypoechoic mass in the left breast at 9 o'clock position measuring 2 x 1.3 x 1.6 cm.  No abnormal lymph nodes in the left axilla. - Left breast 9:00 biopsy (11/02/2023): Poorly differentiated IDC, grade 3, ER/PR negative, HER2 (1+) by IHC.  Ki-67: 20%. - Left breast biopsy LIQ posterior extent and anterior extent (11/17/2023): DCIS, comedo, high nuclear grade. - PET scan (11/25/2023): No evidence of metastatic disease. - Left mastectomy and SLNB on 12/08/2023 by Dr. Henreitta Leber - Pathology: 3.9 x 2.2 x 1.5 cm invasive poorly differentiated ductal adenocarcinoma, grade 3 with extensive necrosis, high-grade DCIS with necrosis, margins free, negative angiolymphatic invasion.  0/6 lymph nodes involved.  pT2 pN0. - Adjuvant chemotherapy with dose dense AC x 4 followed by weekly paclitaxel x 12 was recommended. - 4 cycles of dose dense AC from 01/19/2024 through 03/01/2024 - Weekly paclitaxel started on 03/15/2024   2. Social/Family History: -Lives at home with her daughter. Works full-time with mild physical activity at work. No tobacco use.  -Brother died of lung cancer in his early 29's with a history of tobacco use. No other family history of cancer.   3. BRIP1 heterozygosity: - We reviewed genetic testing results which was positive for BRIP1 mutation, putting her at  high risk for ovarian cancer.  Also at high risk for Fanconi anemia. - She had unilateral salpingo-oophorectomy more than 10 years ago.  I have recommended salpingo-oophorectomy of the remaining ovary.   also recommended that her daughters be tested for the same mutation.    Plan: 1.  T1 cN0 G3 triple negative left breast IDC: - She has completed 4 cycles of AC. - Reviewed labs today: Normal LFTs.  Creatinine mildly elevated at 1.39.  CBC grossly normal with leukocytosis from G-CSF. - She may proceed with paclitaxel today at full dose of 80 mg/m and next week.  I will see her back in 2 weeks.  If there is any worsening of neuropathy, will decrease dose of Taxol.   2.  Peripheral neuropathy: - She has burning sensation in the toes which is worse when she is not moving and at nighttime.  She is prediabetic.  She had symptoms which were present prior to start of chemotherapy but slightly more prominent. - We have tried gabapentin 300 mg twice daily.  She gets drowsy with higher doses.  I have recommended that we try Lyrica 75 mg 3 times daily and titrate up as needed.  We discussed side effects.   3.  High risk drug monitoring: - 2D echo on 11/23/2023 with LVEF 55%, grade 1 diastolic dysfunction.  No signs or symptoms of PND or orthopnea.   4.  Hypomagnesemia: - She cannot tolerate magnesium due to nausea.  Magnesium is low at 1.5 today.  She will receive IV magnesium.    No orders of the defined types were placed in this encounter.  I,Katie Daubenspeck,acting as a Neurosurgeon for Doreatha Massed, MD.,have documented all relevant documentation on the behalf of Doreatha Massed, MD,as directed by  Doreatha Massed, MD while in the presence of Doreatha Massed, MD.   I, Doreatha Massed MD, have reviewed the above documentation for accuracy and completeness, and I agree with the above.   Doreatha Massed, MD   4/16/202512:50 PM  CHIEF COMPLAINT:   Diagnosis: triple  negative left breast cancer   Cancer Staging  Breast cancer of upper-inner quadrant of left female breast [facility]) Staging form: Breast, AJCC 8th Edition - Clinical stage from 11/15/2023: Stage IIB (cT2, cN0(sn), cM0, G3, ER-, PR-, HER2-) - Signed by Doreatha Massed, MD on 01/05/2024    Prior Therapy: 1. Left mastectomy and SLNB, 12/08/23 2. Dose dense AC, 01/1924 - 03/01/24  Current Therapy:  weekly paclitaxel   HISTORY OF PRESENT ILLNESS:   Oncology History  Breast cancer of upper-inner quadrant of left female breast (HCC)  11/15/2023 Initial Diagnosis   Breast cancer of upper-inner quadrant of left female breast (HCC)    Genetic Testing   Likely pathogenic variant in BRIP1 called c.3196del (p.Ser1066Hisfs*12) identified on the [facility] Common Hereditary Cancers+RNA panel. VUS in CTNNA1 called c.1070G>A (p.Arg357His) identified. The report date is 11/29/2023.  The Common Hereditary Cancers Panel + RNA offered by [facility] includes sequencing and/or deletion duplication testing of the following 48 genes: APC*, ATM*, AXIN2, BAP1, BARD1, BMPR1A, BRCA1, BRCA2, BRIP1, CDH1, CDK4, CDKN2A (p14ARF), CDKN2A (p16INK4a), CHEK2, CTNNA1, DICER1*, EPCAM*, FH*, GREM1*, HOXB13, KIT, MBD4, MEN1*, MLH1*, MSH2*, MSH3*, MSH6*, MUTYH, NF1*, NTHL1, PALB2, PDGFRA, PMS2*, POLD1*, POLE, PTEN*, RAD51C, RAD51D, SDHA*, SDHB, SDHC*, SDHD, SMAD4, SMARCA4, STK11, TP53, TSC1*, TSC2, VHL.    11/15/2023 Cancer Staging   Staging form: Breast, AJCC 8th Edition - Clinical stage from 11/15/2023: Stage IIB (cT2, cN0(sn), cM0, G3, ER-, PR-, HER2-) - Signed by Doreatha Massed, MD on 01/05/2024 Histopathologic type: Infiltrating duct carcinoma, NOS Stage prefix: Initial diagnosis Method of lymph node assessment: Sentinel lymph node biopsy Nuclear grade: G3 Histologic grading system: 3 grade system   01/19/2024 -  Chemotherapy   Patient is on Treatment Plan : BREAST DOSE DENSE AC q14d / PACLitaxel q7d        INTERVAL  HISTORY:   Channell is a 58 y.o. female presenting to clinic today for follow up of triple negative left breast cancer. She was last seen by me on 03/01/24.  Today, she states that she is doing well overall. Her appetite level is at 50%. Her energy level is at 25%.  PAST MEDICAL HISTORY:   Past Medical History: Past Medical History:  Diagnosis Date   Allergic rhinitis    Family history of breast cancer    Hyperlipidemia    Hypertension    Myocardial infarction [facility])    Obesity     Surgical History: Past Surgical History:  Procedure Laterality Date   ABDOMINAL HYSTERECTOMY     APPENDECTOMY  11/30/2001   BREAST BIOPSY Left 11/02/2023   path pending   BREAST BIOPSY Left 11/02/2023   [place] LT BREAST BX W LOC DEV 1ST LESION IMG BX SPEC US GUIDE 11/02/2023 [facility]   BREAST BIOPSY Left 11/17/2023   MM LT BREAST BX W LOC DEV 1ST LESION IMAGE BX SPEC STEREO GUIDE 11/17/2023 [facility]   BREAST BIOPSY Left 11/17/2023   MM LT BREAST BX W LOC DEV EA AD LESION IMG BX SPEC STEREO GUIDE 11/17/2023 [facility]   COLONOSCOPY N/A 08/20/2014   Procedure: COLONOSCOPY;  Surgeon:  West Bali, MD;  Location: [facility];  Service: Endoscopy;  Laterality: N/A;  2:15pm   LEFT OOPHORECTOMY  11/30/2001   PORTACATH PLACEMENT Right 12/08/2023   Procedure: INSERTION SUBCLAVIAN PORT-A-CATH;  Surgeon: Lucretia Roers, MD;  Location: [facility] ORS;  Service: General;  Laterality: Right;   SIMPLE MASTECTOMY WITH AXILLARY SENTINEL NODE BIOPSY Left 12/08/2023   Procedure: SIMPLE MASTECTOMY WITH AXILLARY SENTINEL NODE BIOPSY;  Surgeon: Lucretia Roers, MD;  Location: [facility];  Service: General;  Laterality: Left;   TUBAL LIGATION     VESICOVAGINAL FISTULA CLOSURE W/ TAH  11/30/2001    Social History: Social History   Socioeconomic History   Marital status: Married    Spouse name: Not on file   Number of children: 3   Years of education: Not on file   Highest education level: Not on file   Occupational History   Occupation: employed     Comment: [employer]  Tobacco Use   Smoking status: Never   Smokeless tobacco: Never  Vaping Use   Vaping status: Never Used  Substance and Sexual Activity   Alcohol use: No   Drug use: No   Sexual activity: Not on file  Other Topics Concern   Not on file  Social History Narrative   Not on file   Social Drivers of Health   Financial Resource Strain: High Risk (01/19/2024)   Overall Financial Resource Strain (CARDIA)    Difficulty of Paying Living Expenses: Very hard  Food Insecurity: Patient Declined (12/08/2023)   Hunger Vital Sign    Worried About Running Out of Food in the Last Year: Patient declined    Ran Out of Food in the Last Year: Patient declined  Transportation Needs: Patient Declined (12/08/2023)   PRAPARE - Administrator, Civil Service (Medical): Patient declined    Lack of Transportation (Non-Medical): Patient declined  Physical Activity: Inactive (12/08/2018)   [facility]   Exercise Vital Sign    Days of Exercise per Week: 0 days    Minutes of Exercise per Session: 0 min  Stress: Not on file  Social Connections: Not on file  Intimate Partner Violence: Patient Declined (12/08/2023)   Humiliation, Afraid, Rape, and Kick questionnaire    Fear of Current or Ex-Partner: Patient declined    Emotionally Abused: Patient declined    Physically Abused: Patient declined    Sexually Abused: Patient declined    Family History: Family History  Problem Relation Age of Onset   Diabetes Mother    Hypertension Mother    Hypertension Father    Lung cancer Brother 35       metastatic lung cancer to brain   Breast cancer Cousin 67       pat first cousin   Colon cancer Neg Hx     Current Medications:  Current Outpatient Medications:    amlodipine-olmesartan (AZOR) 10-20 MG tablet, TAKE 1 TABLET BY MOUTH DAILY, Disp: 90 tablet, Rfl: 3   APPLE CIDER VINEGAR PO, Take 1 capsule by  mouth daily., Disp: , Rfl:    Ascorbic Acid (VITAMIN C PO), Take 1 capsule by mouth daily., Disp: , Rfl:    ASHWAGANDHA PO, Take 2 tablets by mouth daily., Disp: , Rfl:    ASPIRIN 81 PO, Take 1 tablet by mouth daily., Disp: , Rfl:    atorvastatin (LIPITOR) 40 MG tablet, TAKE 1 TABLET(40 MG) BY MOUTH DAILY, Disp: 90 tablet, Rfl: 3  azelastine (ASTELIN) 0.1 % nasal spray, Place 2 sprays into both nostrils 2 (two) times daily., Disp: , Rfl:    docusate sodium (COLACE) 100 MG capsule, Take 1 capsule (100 mg total) by mouth 2 (two) times daily as needed for mild constipation., Disp: 30 capsule, Rfl: 0   fluticasone (FLONASE) 50 MCG/ACT nasal spray, Place 2 sprays into both nostrils daily., Disp: , Rfl:    lidocaine-prilocaine (EMLA) cream, Apply to affected area once, Disp: 30 g, Rfl: 3   magnesium oxide (MAG-OX) 400 (240 Mg) MG tablet, Take 1 tablet (400 mg total) by mouth daily., Disp: 30 tablet, Rfl: 3   Multiple Vitamin (MULTIVITAMIN) tablet, Take 1 tablet by mouth daily., Disp: , Rfl:    ondansetron (ZOFRAN-ODT) 4 MG disintegrating tablet, Take 1 tablet (4 mg total) by mouth every 6 (six) hours as needed for nausea., Disp: 20 tablet, Rfl: 0   oxyCODONE (OXY IR/ROXICODONE) 5 MG immediate release tablet, Take 1 tablet (5 mg total) by mouth every 12 (twelve) hours as needed for severe pain (pain score 7-10) or breakthrough pain., Disp: 8 tablet, Rfl: 0   PACLitaxel (TAXOL IV), Inject into the vein., Disp: , Rfl:    Pegfilgrastim (NEULASTA [place]), Inject into the skin., Disp: , Rfl:    prochlorperazine (COMPAZINE) 10 MG tablet, Take 1 tablet (10 mg total) by mouth every 6 (six) hours as needed for nausea or vomiting., Disp: 60 tablet, Rfl: 3   triamterene-hydrochlorothiazide (MAXZIDE-25) 37.5-25 MG tablet, TAKE 1 TABLET BY MOUTH DAILY, Disp: 90 tablet, Rfl: 3   gabapentin (NEURONTIN) 300 MG capsule, Take 1 capsule with breakfast, take 2 capsules at bedtime, Disp: 90 capsule, Rfl: 2 No current  facility-administered medications for this visit.  Facility-Administered Medications Ordered in Other Visits:    0.9 %  sodium chloride infusion, , Intravenous, Continuous, Doreatha Massed, MD, Last Rate: 10 mL/hr at 03/15/24 1135, New Bag at 03/15/24 1135   heparin lock flush 100 unit/mL, 500 Units, Intracatheter, Once PRN, Doreatha Massed, MD   magnesium sulfate IVPB 2 g 50 mL, 2 g, Intravenous, Once, Doreatha Massed, MD   PACLitaxel (TAXOL) 156 mg in sodium chloride 0.9 % 250 mL chemo infusion (</= 80mg /m2), 80 mg/m2 (Treatment Plan Recorded), Intravenous, Once, Doreatha Massed, MD   sodium chloride flush (NS) 0.9 % injection 10 mL, 10 mL, Intracatheter, PRN, Doreatha Massed, MD   Allergies: Allergies  Allergen Reactions   Ace Inhibitors Shortness Of Breath    Roof of mouth  Tingling, throat closing   Penicillins     Yeast infections    Poison Ivy Extract Rash    REVIEW OF SYSTEMS:   Review of Systems  Constitutional:  Negative for chills, fatigue and fever.  HENT:   Negative for lump/mass, mouth sores, nosebleeds, sore throat and trouble swallowing.   Eyes:  Negative for eye problems.  Respiratory:  Negative for cough and shortness of breath.   Cardiovascular:  Negative for chest pain, leg swelling and palpitations.  Gastrointestinal:  Positive for diarrhea and nausea. Negative for abdominal pain, constipation and vomiting.  Genitourinary:  Negative for bladder incontinence, difficulty urinating, dysuria, frequency, hematuria and nocturia.   Musculoskeletal:  Negative for arthralgias, back pain, flank pain, myalgias and neck pain.  Skin:  Negative for itching and rash.  Neurological:  Positive for dizziness, headaches and numbness.  Hematological:  Does not bruise/bleed easily.  Psychiatric/Behavioral:  Positive for sleep disturbance. Negative for depression and suicidal ideas. The patient is not nervous/anxious.   All other  systems reviewed and are  negative.    VITALS:   There were no vitals taken for this visit.  Wt Readings from Last 3 Encounters:  03/15/24 181 lb (82.1 kg)  03/01/24 187 lb 6.3 oz (85 kg)  02/16/24 184 lb 11.9 oz (83.8 kg)    There is no height or weight on file to calculate BMI.  Performance status (ECOG): 1 - Symptomatic but completely ambulatory  PHYSICAL EXAM:   Physical Exam Vitals and nursing note reviewed. Exam conducted with a chaperone present.  Constitutional:      Appearance: Normal appearance.  Cardiovascular:     Rate and Rhythm: Normal rate and regular rhythm.     Pulses: Normal pulses.     Heart sounds: Normal heart sounds.  Pulmonary:     Effort: Pulmonary effort is normal.     Breath sounds: Normal breath sounds.  Abdominal:     Palpations: Abdomen is soft. There is no hepatomegaly, splenomegaly or mass.     Tenderness: There is no abdominal tenderness.  Musculoskeletal:     Right lower leg: No edema.     Left lower leg: No edema.  Lymphadenopathy:     Cervical: No cervical adenopathy.     Right cervical: No superficial, deep or posterior cervical adenopathy.    Left cervical: No superficial, deep or posterior cervical adenopathy.     Upper Body:     Right upper body: No supraclavicular or axillary adenopathy.     Left upper body: No supraclavicular or axillary adenopathy.  Neurological:     General: No focal deficit present.     Mental Status: She is alert and oriented to person, place, and time.  Psychiatric:        Mood and Affect: Mood normal.        Behavior: Behavior normal.     LABS:   CBC     Component Value Date/Time   WBC 17.7 (H) 03/15/2024 0956   RBC 2.78 (L) 03/15/2024 0956   HGB 9.2 (L) 03/15/2024 0956   HGB 12.7 04/09/2023 1417   HCT 27.2 (L) 03/15/2024 0956   HCT 36.4 04/09/2023 1417   PLT 186 03/15/2024 0956   PLT 359 04/09/2023 1417   MCV 97.8 03/15/2024 0956   MCV 91 04/09/2023 1417   MCH 33.1 03/15/2024 0956   MCHC 33.8 03/15/2024 0956    RDW 17.4 (H) 03/15/2024 0956   RDW 13.7 04/09/2023 1417   LYMPHSABS 0.5 (L) 03/15/2024 0956   MONOABS 1.2 (H) 03/15/2024 0956   EOSABS 0.2 03/15/2024 0956   BASOSABS 0.0 03/15/2024 0956    CMP      Component Value Date/Time   NA 136 03/15/2024 0956   NA 140 09/10/2023 1343   K 3.2 (L) 03/15/2024 0956   CL 105 03/15/2024 0956   CO2 20 (L) 03/15/2024 0956   GLUCOSE 178 (H) 03/15/2024 0956   BUN 19 03/15/2024 0956   BUN 27 (H) 09/10/2023 1343   CREATININE 1.39 (H) 03/15/2024 0956   CREATININE 0.81 01/10/2020 0908   CALCIUM 8.9 03/15/2024 0956   PROT 7.2 03/15/2024 0956   PROT 7.2 09/10/2023 1343   ALBUMIN 4.2 03/15/2024 0956   ALBUMIN 4.5 09/10/2023 1343   AST 27 03/15/2024 0956   ALT 22 03/15/2024 0956   ALKPHOS 82 03/15/2024 0956   BILITOT 0.6 03/15/2024 0956   BILITOT 0.5 09/10/2023 1343   GFRNONAA 44 (L) 03/15/2024 0956   GFRNONAA 82 01/10/2020 0908   GFRAA 95 01/10/2020  0908     No results found for: "CEA1", "CEA" / No results found for: "CEA1", "CEA" No results found for: "PSA1" No results found for: "ZOX096" No results found for: "CAN125"  No results found for: "TOTALPROTELP", "ALBUMINELP", "A1GS", "A2GS", "BETS", "BETA2SER", "GAMS", "MSPIKE", "SPEI" Lab Results  Component Value Date   TIBC 344 08/17/2014   FERRITIN 348 (H) 08/17/2014   FERRITIN 134 07/09/2014   IRONPCTSAT 28 08/17/2014   No results found for: "LDH"   STUDIES:   No results found.

## 2024-03-15 ENCOUNTER — Other Ambulatory Visit: Payer: Self-pay | Admitting: *Deleted

## 2024-03-15 ENCOUNTER — Inpatient Hospital Stay: Admitting: Hematology

## 2024-03-15 ENCOUNTER — Inpatient Hospital Stay

## 2024-03-15 VITALS — BP 121/57 | HR 71 | Temp 98.5°F | Resp 16 | Wt 181.0 lb

## 2024-03-15 VITALS — BP 109/53 | HR 90 | Temp 98.3°F | Resp 18

## 2024-03-15 DIAGNOSIS — C50212 Malignant neoplasm of upper-inner quadrant of left female breast: Secondary | ICD-10-CM

## 2024-03-15 DIAGNOSIS — Z171 Estrogen receptor negative status [ER-]: Secondary | ICD-10-CM

## 2024-03-15 DIAGNOSIS — R7989 Other specified abnormal findings of blood chemistry: Secondary | ICD-10-CM | POA: Diagnosis not present

## 2024-03-15 DIAGNOSIS — Z5189 Encounter for other specified aftercare: Secondary | ICD-10-CM | POA: Diagnosis not present

## 2024-03-15 DIAGNOSIS — C50812 Malignant neoplasm of overlapping sites of left female breast: Secondary | ICD-10-CM | POA: Diagnosis not present

## 2024-03-15 DIAGNOSIS — D72829 Elevated white blood cell count, unspecified: Secondary | ICD-10-CM | POA: Diagnosis not present

## 2024-03-15 DIAGNOSIS — G629 Polyneuropathy, unspecified: Secondary | ICD-10-CM | POA: Diagnosis not present

## 2024-03-15 DIAGNOSIS — D649 Anemia, unspecified: Secondary | ICD-10-CM | POA: Diagnosis not present

## 2024-03-15 DIAGNOSIS — Z95828 Presence of other vascular implants and grafts: Secondary | ICD-10-CM

## 2024-03-15 DIAGNOSIS — Z5111 Encounter for antineoplastic chemotherapy: Secondary | ICD-10-CM | POA: Diagnosis not present

## 2024-03-15 LAB — CBC WITH DIFFERENTIAL/PLATELET
Abs Immature Granulocytes: 1.1 10*3/uL — ABNORMAL HIGH (ref 0.00–0.07)
Band Neutrophils: 2 %
Basophils Absolute: 0 10*3/uL (ref 0.0–0.1)
Basophils Relative: 0 %
Eosinophils Absolute: 0.2 10*3/uL (ref 0.0–0.5)
Eosinophils Relative: 1 %
HCT: 27.2 % — ABNORMAL LOW (ref 36.0–46.0)
Hemoglobin: 9.2 g/dL — ABNORMAL LOW (ref 12.0–15.0)
Lymphocytes Relative: 3 %
Lymphs Abs: 0.5 10*3/uL — ABNORMAL LOW (ref 0.7–4.0)
MCH: 33.1 pg (ref 26.0–34.0)
MCHC: 33.8 g/dL (ref 30.0–36.0)
MCV: 97.8 fL (ref 80.0–100.0)
Metamyelocytes Relative: 4 %
Monocytes Absolute: 1.2 10*3/uL — ABNORMAL HIGH (ref 0.1–1.0)
Monocytes Relative: 7 %
Myelocytes: 2 %
Neutro Abs: 14.7 10*3/uL — ABNORMAL HIGH (ref 1.7–7.7)
Neutrophils Relative %: 81 %
Platelets: 186 10*3/uL (ref 150–400)
RBC: 2.78 MIL/uL — ABNORMAL LOW (ref 3.87–5.11)
RDW: 17.4 % — ABNORMAL HIGH (ref 11.5–15.5)
WBC: 17.7 10*3/uL — ABNORMAL HIGH (ref 4.0–10.5)
nRBC: 0.3 % — ABNORMAL HIGH (ref 0.0–0.2)

## 2024-03-15 LAB — COMPREHENSIVE METABOLIC PANEL WITH GFR
ALT: 22 U/L (ref 0–44)
AST: 27 U/L (ref 15–41)
Albumin: 4.2 g/dL (ref 3.5–5.0)
Alkaline Phosphatase: 82 U/L (ref 38–126)
Anion gap: 11 (ref 5–15)
BUN: 19 mg/dL (ref 6–20)
CO2: 20 mmol/L — ABNORMAL LOW (ref 22–32)
Calcium: 8.9 mg/dL (ref 8.9–10.3)
Chloride: 105 mmol/L (ref 98–111)
Creatinine, Ser: 1.39 mg/dL — ABNORMAL HIGH (ref 0.44–1.00)
GFR, Estimated: 44 mL/min — ABNORMAL LOW (ref >60)
Glucose, Bld: 178 mg/dL — ABNORMAL HIGH (ref 70–99)
Potassium: 3.2 mmol/L — ABNORMAL LOW (ref 3.5–5.1)
Sodium: 136 mmol/L (ref 135–145)
Total Bilirubin: 0.6 mg/dL (ref 0.0–1.2)
Total Protein: 7.2 g/dL (ref 6.5–8.1)

## 2024-03-15 LAB — MAGNESIUM: Magnesium: 1.5 mg/dL — ABNORMAL LOW (ref 1.7–2.4)

## 2024-03-15 MED ORDER — FAMOTIDINE IN NACL 20-0.9 MG/50ML-% IV SOLN
20.0000 mg | Freq: Once | INTRAVENOUS | Status: AC
  Administered 2024-03-15: 20 mg via INTRAVENOUS
  Filled 2024-03-15: qty 50

## 2024-03-15 MED ORDER — MAGNESIUM SULFATE 2 GM/50ML IV SOLN
2.0000 g | Freq: Once | INTRAVENOUS | Status: AC
  Administered 2024-03-15: 2 g via INTRAVENOUS
  Filled 2024-03-15: qty 50

## 2024-03-15 MED ORDER — SODIUM CHLORIDE 0.9 % IV SOLN
80.0000 mg/m2 | Freq: Once | INTRAVENOUS | Status: AC
  Administered 2024-03-15: 156 mg via INTRAVENOUS
  Filled 2024-03-15: qty 26

## 2024-03-15 MED ORDER — POTASSIUM CHLORIDE CRYS ER 20 MEQ PO TBCR
40.0000 meq | EXTENDED_RELEASE_TABLET | Freq: Once | ORAL | Status: AC
  Administered 2024-03-15: 40 meq via ORAL
  Filled 2024-03-15: qty 2

## 2024-03-15 MED ORDER — PREGABALIN 75 MG PO CAPS: 75.0000 mg | ORAL_CAPSULE | Freq: Three times a day (TID) | ORAL | 3 refills | Status: DC

## 2024-03-15 MED ORDER — SODIUM CHLORIDE 0.9% FLUSH
10.0000 mL | Freq: Once | INTRAVENOUS | Status: AC
  Administered 2024-03-15: 10 mL via INTRAVENOUS

## 2024-03-15 MED ORDER — HEPARIN SOD (PORK) LOCK FLUSH 100 UNIT/ML IV SOLN
500.0000 [IU] | Freq: Once | INTRAVENOUS | Status: AC | PRN
  Administered 2024-03-15: 500 [IU]

## 2024-03-15 MED ORDER — GABAPENTIN 300 MG PO CAPS: ORAL_CAPSULE | ORAL | 2 refills | Status: DC

## 2024-03-15 MED ORDER — SODIUM CHLORIDE 0.9 % IV SOLN: INTRAVENOUS | Status: DC

## 2024-03-15 MED ORDER — DEXAMETHASONE SODIUM PHOSPHATE 10 MG/ML IJ SOLN
10.0000 mg | Freq: Once | INTRAMUSCULAR | Status: AC
  Administered 2024-03-15: 10 mg via INTRAVENOUS
  Filled 2024-03-15: qty 1

## 2024-03-15 MED ORDER — SODIUM CHLORIDE 0.9% FLUSH
10.0000 mL | INTRAVENOUS | Status: DC | PRN
  Administered 2024-03-15: 10 mL

## 2024-03-15 MED ORDER — DIPHENHYDRAMINE HCL 50 MG/ML IJ SOLN
50.0000 mg | Freq: Once | INTRAMUSCULAR | Status: AC
  Administered 2024-03-15: 50 mg via INTRAVENOUS
  Filled 2024-03-15: qty 1

## 2024-03-15 MED ORDER — ONDANSETRON HCL 4 MG/2ML IJ SOLN
8.0000 mg | Freq: Once | INTRAMUSCULAR | Status: AC
  Administered 2024-03-15: 8 mg via INTRAVENOUS
  Filled 2024-03-15: qty 4

## 2024-03-15 MED ORDER — SODIUM CHLORIDE 0.9 % IV SOLN: Freq: Once | INTRAVENOUS | Status: AC

## 2024-03-15 NOTE — Progress Notes (Signed)
 Patient has been examined by Dr. Ellin Saba. Vital signs and labs have been reviewed by MD - ANC, Creatinine, LFTs, hemoglobin, and platelets are within treatment parameters per M.D. - pt may proceed with treatment.  Primary RN and pharmacy notified.

## 2024-03-15 NOTE — Progress Notes (Signed)
 Patient presents today for 1st Taxol infusion. Vital signs and lab work within parameters for treatment. Potassium 3.2 and Magnesium 1.5. Standing orders followed. Message received from A.Alva Jewels RN / Dr. Katragadda to proceed with treatment. Patient seen by Dr. Cheree Cords and labs reviewed. Patient will receive 2 grams Magnesium Sulfate IV and 40 mEq K-DUR potassium PO x 1 dose.  Orders received to give 500 mls of Normal Saline over 30 minutes.   13:05 pm. 116/66, 95, 18, 93%, 98.1 oral temp.  Taxol started. No complaints noted at this time.   Treatment given today per MD orders. Tolerated infusion without adverse affects. Vital signs stable. No complaints at this time. Discharged from clinic ambulatory in stable condition. Alert and oriented x 3. F/U with [facility] as scheduled.

## 2024-03-15 NOTE — Patient Instructions (Signed)
 [facility] [place] - A DEPT OF [facility]  Discharge Instructions: Thank you for choosing [facility] to provide your oncology and hematology care.  If you have a lab appointment with the Cancer Center - please note that after April 8th, 2024, all labs will be drawn in the cancer center.  You do not have to check in or register with the main entrance as you have in the past but will complete your check-in in the cancer center.  Wear comfortable clothing and clothing appropriate for easy access to any Portacath or PICC line.   We strive to give you quality time with your provider. You may need to reschedule your appointment if you arrive late (15 or more minutes).  Arriving late affects you and other patients whose appointments are after yours.  Also, if you miss three or more appointments without notifying the office, you may be dismissed from the clinic at the provider's discretion.      For prescription refill requests, have your pharmacy contact our office and allow 72 hours for refills to be completed.    Today you received the following chemotherapy and/or immunotherapy agents taxol. Paclitaxel Injection What is this medication? PACLITAXEL (PAK li TAX el) treats some types of cancer. It works by slowing down the growth of cancer cells. This medicine may be used for other purposes; ask your health care provider or pharmacist if you have questions. COMMON BRAND NAME(S): Onxol, Taxol What should I tell my care team before I take this medication? They need to know if you have any of these conditions: Heart disease Liver disease Low white blood cell levels An unusual or allergic reaction to paclitaxel, other medications, foods, dyes, or preservatives If you or your partner are pregnant or trying to get pregnant Breast-feeding How should I use this medication? This medication is injected into a vein. It is given by your care team in a hospital or  clinic setting. Talk to your care team about the use of this medication in children. While it may be given to children for selected conditions, precautions do apply. Overdosage: If you think you have taken too much of this medicine contact a poison control center or emergency room at once. NOTE: This medicine is only for you. Do not share this medicine with others. What if I miss a dose? Keep appointments for follow-up doses. It is important not to miss your dose. Call your care team if you are unable to keep an appointment. What may interact with this medication? Do not take this medication with any of the following: Live virus vaccines Other medications may affect the way this medication works. Talk with your care team about all of the medications you take. They may suggest changes to your treatment plan to lower the risk of side effects and to make sure your medications work as intended. This list may not describe all possible interactions. Give your health care provider a list of all the medicines, herbs, non-prescription drugs, or dietary supplements you use. Also tell them if you smoke, drink alcohol, or use illegal drugs. Some items may interact with your medicine. What should I watch for while using this medication? Your condition will be monitored carefully while you are receiving this medication. You may need blood work while taking this medication. This medication may make you feel generally unwell. This is not uncommon as chemotherapy can affect healthy cells as well as cancer cells. Report any side  effects. Continue your course of treatment even though you feel ill unless your care team tells you to stop. This medication can cause serious allergic reactions. To reduce the risk, your care team may give you other medications to take before receiving this one. Be sure to follow the directions from your care team. This medication may increase your risk of getting an infection. Call your care  team for advice if you get a fever, chills, sore throat, or other symptoms of a cold or flu. Do not treat yourself. Try to avoid being around people who are sick. This medication may increase your risk to bruise or bleed. Call your care team if you notice any unusual bleeding. Be careful brushing or flossing your teeth or using a toothpick because you may get an infection or bleed more easily. If you have any dental work done, tell your dentist you are receiving this medication. Talk to your care team if you may be pregnant. Serious birth defects can occur if you take this medication during pregnancy. Talk to your care team before breastfeeding. Changes to your treatment plan may be needed. What side effects may I notice from receiving this medication? Side effects that you should report to your care team as soon as possible: Allergic reactions--skin rash, itching, hives, swelling of the face, lips, tongue, or throat Heart rhythm changes--fast or irregular heartbeat, dizziness, feeling faint or lightheaded, chest pain, trouble breathing Increase in blood pressure Infection--fever, chills, cough, sore throat, wounds that don't heal, pain or trouble when passing urine, general feeling of discomfort or being unwell Low blood pressure--dizziness, feeling faint or lightheaded, blurry vision Low red blood cell level--unusual weakness or fatigue, dizziness, headache, trouble breathing Painful swelling, warmth, or redness of the skin, blisters or sores at the infusion site Pain, tingling, or numbness in the hands or feet Slow heartbeat--dizziness, feeling faint or lightheaded, confusion, trouble breathing, unusual weakness or fatigue Unusual bruising or bleeding Side effects that usually do not require medical attention (report to your care team if they continue or are bothersome): Diarrhea Hair loss Joint pain Loss of appetite Muscle pain Nausea Vomiting This list may not describe all possible side  effects. Call your doctor for medical advice about side effects. You may report side effects to FDA at 1-800-FDA-1088. Where should I keep my medication? This medication is given in a hospital or clinic. It will not be stored at home. NOTE: This sheet is a summary. It may not cover all possible information. If you have questions about this medicine, talk to your doctor, pharmacist, or health care provider.  2024 Elsevier/Gold Standard (2022-04-07 00:00:00)      To help prevent nausea and vomiting after your treatment, we encourage you to take your nausea medication as directed.  BELOW ARE SYMPTOMS THAT SHOULD BE REPORTED IMMEDIATELY: *FEVER GREATER THAN 100.4 F (38 C) OR HIGHER *CHILLS OR SWEATING *NAUSEA AND VOMITING THAT IS NOT CONTROLLED WITH YOUR NAUSEA MEDICATION *UNUSUAL SHORTNESS OF BREATH *UNUSUAL BRUISING OR BLEEDING *URINARY PROBLEMS (pain or burning when urinating, or frequent urination) *BOWEL PROBLEMS (unusual diarrhea, constipation, pain near the anus) TENDERNESS IN MOUTH AND THROAT WITH OR WITHOUT PRESENCE OF ULCERS (sore throat, sores in mouth, or a toothache) UNUSUAL RASH, SWELLING OR PAIN  UNUSUAL VAGINAL DISCHARGE OR ITCHING   Items with * indicate a potential emergency and should be followed up as soon as possible or go to the Emergency Department if any problems should occur.  Please show the CHEMOTHERAPY ALERT CARD or  IMMUNOTHERAPY ALERT CARD at check-in to the Emergency Department and triage nurse.  Should you have questions after your visit or need to cancel or reschedule your appointment, please contact [facility] [place] - A DEPT OF [facility] [phone]  and follow the prompts.  Office hours are 8:00 a.m. to 4:30 p.m. Monday - Friday. Please note that voicemails left after 4:00 p.m. may not be returned until the following business day.  We are closed weekends and major holidays. You have access to a nurse at all times for urgent  questions. Please call the main number to the clinic [phone] and follow the prompts.  For any non-urgent questions, you may also contact your provider using MyChart. We now offer e-Visits for anyone 74 and older to request care online for non-urgent symptoms. For details visit [email or web address].   Also download the MyChart app! Go to the app store, search "MyChart", open the app, select [place], and log in with your MyChart username and password.

## 2024-03-15 NOTE — Patient Instructions (Addendum)
 [facility] at [facility] Discharge Instructions   You were seen and examined today by Dr. Cheree Cords.  He reviewed the results of your lab work which are normal/stable.   We sent a medication called Lyrica to help with your neuropathy. Stop taking gabapentin.   We will proceed with your treatment today.   Return as scheduled.    Thank you for choosing [facility] at [facility] to provide your oncology and hematology care.  To afford each patient quality time with our provider, please arrive at least 15 minutes before your scheduled appointment time.   If you have a lab appointment with the Cancer Center please come in thru the Main Entrance and check in at the main information desk.  You need to re-schedule your appointment should you arrive 10 or more minutes late.  We strive to give you quality time with our providers, and arriving late affects you and other patients whose appointments are after yours.  Also, if you no show three or more times for appointments you may be dismissed from the clinic at the providers discretion.     Again, thank you for choosing [facility]  Our hope is that these requests will decrease the amount of time that you wait before being seen by our physicians.       _____________________________________________________________  Should you have questions after your visit to [facility], please contact our office at [phone] and follow the prompts.  Our office hours are 8:00 a.m. and 4:30 p.m. Monday - Friday.  Please note that voicemails left after 4:00 p.m. may not be returned until the following business day.  We are closed weekends and major holidays.  You do have access to a nurse 24-7, just call the main number to the clinic [phone] and do not press any options, hold on the line and a nurse will answer the phone.    For prescription refill requests, have your pharmacy contact  our office and allow 72 hours.    Due to Covid, you will need to wear a mask upon entering the hospital. If you do not have a mask, a mask will be given to you at the Main Entrance upon arrival. For doctor visits, patients may have 1 support person age 21 or older with them. For treatment visits, patients can not have anyone with them due to social distancing guidelines and our immunocompromised population.

## 2024-03-16 ENCOUNTER — Inpatient Hospital Stay: Admitting: Dietician

## 2024-03-16 ENCOUNTER — Other Ambulatory Visit: Payer: Self-pay

## 2024-03-16 NOTE — Progress Notes (Signed)
 24 hour call back.  No answer, left a message.

## 2024-03-16 NOTE — Progress Notes (Signed)
 Nutrition Follow-up:  Pt with left IDC, triple negative. S/p left mastectomy with SNLB (1/8) under the care of Dr. Collene Dawson. Patient completed adjuvant chemotherapy with AC x 4 cycles. Currently receiving weekly paclitaxel x 12 weeks (start 4/17).   Spoke with pt via telephone. She reports feeling great today. Patient says appetite is good now. She has had 2 hotdogs with a tea from [employer]. Recalls few slices of pizza for supper. Patient does not like the taste of water, which makes it hard to drink. She does better if she adds lemonade to it. Patient reports diarrhea has resolved.    Medications: reviewed   Labs: 4/16 - K 3.2, glucose 178, Cr 1.39  Anthropometrics: Wt 181 lb on 4/16 decreased 3% in 2 weeks - this is severe  4/2 - 187 lb 6.3 oz 3/19 - 184 lb 11.9 oz  2/19 - 186 lb 9.6 oz    NUTRITION DIAGNOSIS: Food and nutrition related knowledge deficit continues   INTERVENTION:  Reviewed importance of adequate calorie/protein energy intake to preserve lean body mass Continue including good sources of protein at every meal Pt is working to increase intake of water to recommended 64 oz Recommend supplementing with Ensure Plus/equivalent when appetite is decreased Will leave Ensure samples + coupons for p/u at 4/23 [facility] appt (pt aware)   MONITORING, EVALUATION, GOAL: wt trends, intake   NEXT VISIT: Thursday May 29 via telephone

## 2024-03-17 ENCOUNTER — Other Ambulatory Visit: Payer: Self-pay

## 2024-03-22 ENCOUNTER — Inpatient Hospital Stay

## 2024-03-22 ENCOUNTER — Inpatient Hospital Stay: Admitting: Hematology

## 2024-03-22 VITALS — BP 123/71 | HR 102 | Temp 97.6°F | Resp 19

## 2024-03-22 VITALS — BP 114/76 | HR 93 | Resp 19 | Wt 183.0 lb

## 2024-03-22 DIAGNOSIS — Z171 Estrogen receptor negative status [ER-]: Secondary | ICD-10-CM | POA: Diagnosis not present

## 2024-03-22 DIAGNOSIS — R7989 Other specified abnormal findings of blood chemistry: Secondary | ICD-10-CM | POA: Diagnosis not present

## 2024-03-22 DIAGNOSIS — G629 Polyneuropathy, unspecified: Secondary | ICD-10-CM | POA: Diagnosis not present

## 2024-03-22 DIAGNOSIS — C50812 Malignant neoplasm of overlapping sites of left female breast: Secondary | ICD-10-CM | POA: Diagnosis not present

## 2024-03-22 DIAGNOSIS — D649 Anemia, unspecified: Secondary | ICD-10-CM | POA: Diagnosis not present

## 2024-03-22 DIAGNOSIS — Z5189 Encounter for other specified aftercare: Secondary | ICD-10-CM | POA: Diagnosis not present

## 2024-03-22 DIAGNOSIS — D72829 Elevated white blood cell count, unspecified: Secondary | ICD-10-CM | POA: Diagnosis not present

## 2024-03-22 DIAGNOSIS — Z5111 Encounter for antineoplastic chemotherapy: Secondary | ICD-10-CM | POA: Diagnosis not present

## 2024-03-22 DIAGNOSIS — Z95828 Presence of other vascular implants and grafts: Secondary | ICD-10-CM

## 2024-03-22 LAB — CBC WITH DIFFERENTIAL/PLATELET
Abs Immature Granulocytes: 0.06 10*3/uL (ref 0.00–0.07)
Basophils Absolute: 0 10*3/uL (ref 0.0–0.1)
Basophils Relative: 1 %
Eosinophils Absolute: 0.1 10*3/uL (ref 0.0–0.5)
Eosinophils Relative: 1 %
HCT: 25.9 % — ABNORMAL LOW (ref 36.0–46.0)
Hemoglobin: 8.6 g/dL — ABNORMAL LOW (ref 12.0–15.0)
Immature Granulocytes: 1 %
Lymphocytes Relative: 7 %
Lymphs Abs: 0.5 10*3/uL — ABNORMAL LOW (ref 0.7–4.0)
MCH: 32.7 pg (ref 26.0–34.0)
MCHC: 33.2 g/dL (ref 30.0–36.0)
MCV: 98.5 fL (ref 80.0–100.0)
Monocytes Absolute: 0.7 10*3/uL (ref 0.1–1.0)
Monocytes Relative: 10 %
Neutro Abs: 5.7 10*3/uL (ref 1.7–7.7)
Neutrophils Relative %: 80 %
Platelets: 320 10*3/uL (ref 150–400)
RBC: 2.63 MIL/uL — ABNORMAL LOW (ref 3.87–5.11)
RDW: 18.7 % — ABNORMAL HIGH (ref 11.5–15.5)
WBC: 7.1 10*3/uL (ref 4.0–10.5)
nRBC: 0.3 % — ABNORMAL HIGH (ref 0.0–0.2)

## 2024-03-22 LAB — COMPREHENSIVE METABOLIC PANEL WITH GFR
ALT: 27 U/L (ref 0–44)
AST: 27 U/L (ref 15–41)
Albumin: 4 g/dL (ref 3.5–5.0)
Alkaline Phosphatase: 52 U/L (ref 38–126)
Anion gap: 13 (ref 5–15)
BUN: 26 mg/dL — ABNORMAL HIGH (ref 6–20)
CO2: 20 mmol/L — ABNORMAL LOW (ref 22–32)
Calcium: 9.5 mg/dL (ref 8.9–10.3)
Chloride: 104 mmol/L (ref 98–111)
Creatinine, Ser: 1.18 mg/dL — ABNORMAL HIGH (ref 0.44–1.00)
GFR, Estimated: 54 mL/min — ABNORMAL LOW (ref >60)
Glucose, Bld: 164 mg/dL — ABNORMAL HIGH (ref 70–99)
Potassium: 3.3 mmol/L — ABNORMAL LOW (ref 3.5–5.1)
Sodium: 137 mmol/L (ref 135–145)
Total Bilirubin: 0.5 mg/dL (ref 0.0–1.2)
Total Protein: 6.8 g/dL (ref 6.5–8.1)

## 2024-03-22 LAB — MAGNESIUM: Magnesium: 1.5 mg/dL — ABNORMAL LOW (ref 1.7–2.4)

## 2024-03-22 MED ORDER — SODIUM CHLORIDE 0.9 % IV SOLN: INTRAVENOUS | Status: DC

## 2024-03-22 MED ORDER — DIPHENHYDRAMINE HCL 50 MG/ML IJ SOLN: 50.0000 mg | Freq: Once | INTRAMUSCULAR | Status: DC

## 2024-03-22 MED ORDER — SODIUM CHLORIDE 0.9% FLUSH
10.0000 mL | INTRAVENOUS | Status: DC | PRN
Start: 2024-03-22 — End: 2024-03-22

## 2024-03-22 MED ORDER — SODIUM CHLORIDE FLUSH 0.9 % IV SOLN
10.0000 mL | Freq: Once | INTRAVENOUS | Status: AC
  Administered 2024-03-22: 10 mL via INTRAVENOUS
  Filled 2024-03-22: qty 10

## 2024-03-22 MED ORDER — POTASSIUM CHLORIDE CRYS ER 20 MEQ PO TBCR
40.0000 meq | EXTENDED_RELEASE_TABLET | Freq: Once | ORAL | Status: AC
  Administered 2024-03-22: 40 meq via ORAL
  Filled 2024-03-22: qty 2

## 2024-03-22 MED ORDER — SODIUM CHLORIDE 0.9 % IV SOLN
80.0000 mg/m2 | Freq: Once | INTRAVENOUS | Status: AC
  Administered 2024-03-22: 156 mg via INTRAVENOUS
  Filled 2024-03-22: qty 26

## 2024-03-22 MED ORDER — FAMOTIDINE IN NACL 20-0.9 MG/50ML-% IV SOLN
20.0000 mg | Freq: Once | INTRAVENOUS | Status: AC
  Administered 2024-03-22: 20 mg via INTRAVENOUS
  Filled 2024-03-22: qty 50

## 2024-03-22 MED ORDER — DEXAMETHASONE SODIUM PHOSPHATE 10 MG/ML IJ SOLN
10.0000 mg | Freq: Once | INTRAMUSCULAR | Status: AC
  Administered 2024-03-22: 10 mg via INTRAVENOUS
  Filled 2024-03-22: qty 1

## 2024-03-22 MED ORDER — ONDANSETRON HCL 4 MG/2ML IJ SOLN
8.0000 mg | Freq: Once | INTRAMUSCULAR | Status: AC
  Administered 2024-03-22: 8 mg via INTRAVENOUS
  Filled 2024-03-22: qty 4

## 2024-03-22 MED ORDER — MAGNESIUM SULFATE 2 GM/50ML IV SOLN
2.0000 g | Freq: Once | INTRAVENOUS | Status: AC
  Administered 2024-03-22: 2 g via INTRAVENOUS
  Filled 2024-03-22: qty 50

## 2024-03-22 MED ORDER — HEPARIN SOD (PORK) LOCK FLUSH 100 UNIT/ML IV SOLN
500.0000 [IU] | Freq: Once | INTRAVENOUS | Status: AC | PRN
  Administered 2024-03-22: 500 [IU]

## 2024-03-22 NOTE — Patient Instructions (Signed)
 [facility] [place] - A DEPT OF [place]  Discharge Instructions: Thank you for choosing [facility] to provide your oncology and hematology care.  If you have a lab appointment with the Cancer Center - please note that after April 8th, 2024, all labs will be drawn in the cancer center.  You do not have to check in or register with the main entrance as you have in the past but will complete your check-in in the cancer center.  Wear comfortable clothing and clothing appropriate for easy access to any Portacath or PICC line.   We strive to give you quality time with your provider. You may need to reschedule your appointment if you arrive late (15 or more minutes).  Arriving late affects you and other patients whose appointments are after yours.  Also, if you miss three or more appointments without notifying the office, you may be dismissed from the clinic at the provider's discretion.      For prescription refill requests, have your pharmacy contact our office and allow 72 hours for refills to be completed.    Today you received the following chemotherapy and/or immunotherapy agents Paclitaxel , 40 mEq potasium chloride p.o x 1 dose, and 2g IV magnesium  sulfate   To help prevent nausea and vomiting after your treatment, we encourage you to take your nausea medication as directed.  Paclitaxel  Injection What is this medication? PACLITAXEL  (PAK li TAX el) treats some types of cancer. It works by slowing down the growth of cancer cells. This medicine may be used for other purposes; ask your health care provider or pharmacist if you have questions. COMMON BRAND NAME(S): Onxol, Taxol  What should I tell my care team before I take this medication? They need to know if you have any of these conditions: Heart disease Liver disease Low white blood cell levels An unusual or allergic reaction to paclitaxel , other medications, foods, dyes, or preservatives If you or  your partner are pregnant or trying to get pregnant Breast-feeding How should I use this medication? This medication is injected into a vein. It is given by your care team in a hospital or clinic setting. Talk to your care team about the use of this medication in children. While it may be given to children for selected conditions, precautions do apply. Overdosage: If you think you have taken too much of this medicine contact a poison control center or emergency room at once. NOTE: This medicine is only for you. Do not share this medicine with others. What if I miss a dose? Keep appointments for follow-up doses. It is important not to miss your dose. Call your care team if you are unable to keep an appointment. What may interact with this medication? Do not take this medication with any of the following: Live virus vaccines Other medications may affect the way this medication works. Talk with your care team about all of the medications you take. They may suggest changes to your treatment plan to lower the risk of side effects and to make sure your medications work as intended. This list may not describe all possible interactions. Give your health care provider a list of all the medicines, herbs, non-prescription drugs, or dietary supplements you use. Also tell them if you smoke, drink alcohol, or use illegal drugs. Some items may interact with your medicine. What should I watch for while using this medication? Your condition will be monitored carefully while you are receiving this medication.  You may need blood work while taking this medication. This medication may make you feel generally unwell. This is not uncommon as chemotherapy can affect healthy cells as well as cancer cells. Report any side effects. Continue your course of treatment even though you feel ill unless your care team tells you to stop. This medication can cause serious allergic reactions. To reduce the risk, your care team may give  you other medications to take before receiving this one. Be sure to follow the directions from your care team. This medication may increase your risk of getting an infection. Call your care team for advice if you get a fever, chills, sore throat, or other symptoms of a cold or flu. Do not treat yourself. Try to avoid being around people who are sick. This medication may increase your risk to bruise or bleed. Call your care team if you notice any unusual bleeding. Be careful brushing or flossing your teeth or using a toothpick because you may get an infection or bleed more easily. If you have any dental work done, tell your dentist you are receiving this medication. Talk to your care team if you may be pregnant. Serious birth defects can occur if you take this medication during pregnancy. Talk to your care team before breastfeeding. Changes to your treatment plan may be needed. What side effects may I notice from receiving this medication? Side effects that you should report to your care team as soon as possible: Allergic reactions--skin rash, itching, hives, swelling of the face, lips, tongue, or throat Heart rhythm changes--fast or irregular heartbeat, dizziness, feeling faint or lightheaded, chest pain, trouble breathing Increase in blood pressure Infection--fever, chills, cough, sore throat, wounds that don't heal, pain or trouble when passing urine, general feeling of discomfort or being unwell Low blood pressure--dizziness, feeling faint or lightheaded, blurry vision Low red blood cell level--unusual weakness or fatigue, dizziness, headache, trouble breathing Painful swelling, warmth, or redness of the skin, blisters or sores at the infusion site Pain, tingling, or numbness in the hands or feet Slow heartbeat--dizziness, feeling faint or lightheaded, confusion, trouble breathing, unusual weakness or fatigue Unusual bruising or bleeding Side effects that usually do not require medical attention  (report to your care team if they continue or are bothersome): Diarrhea Hair loss Joint pain Loss of appetite Muscle pain Nausea Vomiting This list may not describe all possible side effects. Call your doctor for medical advice about side effects. You may report side effects to FDA at 1-800-FDA-1088. Where should I keep my medication? This medication is given in a hospital or clinic. It will not be stored at home. NOTE: This sheet is a summary. It may not cover all possible information. If you have questions about this medicine, talk to your doctor, pharmacist, or health care provider.  2024 Elsevier/Gold Standard (2022-04-07 00:00:00)  BELOW ARE SYMPTOMS THAT SHOULD BE REPORTED IMMEDIATELY: *FEVER GREATER THAN 100.4 F (38 C) OR HIGHER *CHILLS OR SWEATING *NAUSEA AND VOMITING THAT IS NOT CONTROLLED WITH YOUR NAUSEA MEDICATION *UNUSUAL SHORTNESS OF BREATH *UNUSUAL BRUISING OR BLEEDING *URINARY PROBLEMS (pain or burning when urinating, or frequent urination) *BOWEL PROBLEMS (unusual diarrhea, constipation, pain near the anus) TENDERNESS IN MOUTH AND THROAT WITH OR WITHOUT PRESENCE OF ULCERS (sore throat, sores in mouth, or a toothache) UNUSUAL RASH, SWELLING OR PAIN  UNUSUAL VAGINAL DISCHARGE OR ITCHING   Items with * indicate a potential emergency and should be followed up as soon as possible or go to the Emergency Department if any  problems should occur.  Please show the CHEMOTHERAPY ALERT CARD or IMMUNOTHERAPY ALERT CARD at check-in to the Emergency Department and triage nurse.  Should you have questions after your visit or need to cancel or reschedule your appointment, please contact [facility] [place] - A DEPT OF [facility] [phone]  and follow the prompts.  Office hours are 8:00 a.m. to 4:30 p.m. Monday - Friday. Please note that voicemails left after 4:00 p.m. may not be returned until the following business day.  We are closed weekends and major  holidays. You have access to a nurse at all times for urgent questions. Please call the main number to the clinic [phone] and follow the prompts.  For any non-urgent questions, you may also contact your provider using MyChart. We now offer e-Visits for anyone 13 and older to request care online for non-urgent symptoms. For details visit [email or web address].   Also download the MyChart app! Go to the app store, search "MyChart", open the app, select [place], and log in with your MyChart username and password.

## 2024-03-22 NOTE — Progress Notes (Signed)
 Discontinue diphenhydramine  from oncology treatment plan --> Add Quzyttir (cetirizine) 10 mg IVPush x 1 as premedication for oncology treatment plan.  Hold dose today patient took at home.  T.O. Dr Davina Ester, PharmD

## 2024-03-22 NOTE — Progress Notes (Signed)
 Patient presents today for Paclitaxel  infusion.  Patient is in satisfactory condition with no new complaints voiced.  Vital signs are stable.  Labs reviewed and all labs are within treatment parameters.Patient will receive 40 mEq potassium chloride  p.o x 1 dose and 2g IV magnesium  sulfate per Dr.Katragada's standing orders.   Patient took Zyrtec P.O. at home prior to arrival and does not want Benadryl  here at the clinic. Patient stated she was driving home and it'll make her too sleepy to drive. Patient advised that Benadryl  will help prevent any reaction from the Paclitaxel  and call a ride if needed, but patient still refused and did not take the Benadryl . Pharmacy made aware. We will proceed with treatment per MD orders.    Treatment given today per MD orders. Tolerated infusion without adverse affects. Vital signs stable. No complaints at this time. Discharged from clinic ambulatory in stable condition. Alert and oriented x 3. F/U with [facility] as scheduled.

## 2024-03-29 ENCOUNTER — Inpatient Hospital Stay

## 2024-03-29 ENCOUNTER — Inpatient Hospital Stay (HOSPITAL_BASED_OUTPATIENT_CLINIC_OR_DEPARTMENT_OTHER): Admitting: Hematology

## 2024-03-29 VITALS — BP 106/68 | HR 89 | Temp 97.8°F | Resp 18

## 2024-03-29 VITALS — HR 51

## 2024-03-29 VITALS — BP 130/70 | HR 110 | Temp 97.8°F | Resp 18 | Wt 185.2 lb

## 2024-03-29 DIAGNOSIS — D649 Anemia, unspecified: Secondary | ICD-10-CM

## 2024-03-29 DIAGNOSIS — R7989 Other specified abnormal findings of blood chemistry: Secondary | ICD-10-CM | POA: Diagnosis not present

## 2024-03-29 DIAGNOSIS — G629 Polyneuropathy, unspecified: Secondary | ICD-10-CM | POA: Diagnosis not present

## 2024-03-29 DIAGNOSIS — D72829 Elevated white blood cell count, unspecified: Secondary | ICD-10-CM | POA: Diagnosis not present

## 2024-03-29 DIAGNOSIS — C50812 Malignant neoplasm of overlapping sites of left female breast: Secondary | ICD-10-CM | POA: Diagnosis not present

## 2024-03-29 DIAGNOSIS — Z95828 Presence of other vascular implants and grafts: Secondary | ICD-10-CM

## 2024-03-29 DIAGNOSIS — Z171 Estrogen receptor negative status [ER-]: Secondary | ICD-10-CM | POA: Diagnosis not present

## 2024-03-29 DIAGNOSIS — Z5111 Encounter for antineoplastic chemotherapy: Secondary | ICD-10-CM | POA: Diagnosis not present

## 2024-03-29 DIAGNOSIS — Z5189 Encounter for other specified aftercare: Secondary | ICD-10-CM | POA: Diagnosis not present

## 2024-03-29 LAB — COMPREHENSIVE METABOLIC PANEL WITH GFR
ALT: 40 U/L (ref 0–44)
AST: 32 U/L (ref 15–41)
Albumin: 4 g/dL (ref 3.5–5.0)
Alkaline Phosphatase: 50 U/L (ref 38–126)
Anion gap: 12 (ref 5–15)
BUN: 28 mg/dL — ABNORMAL HIGH (ref 6–20)
CO2: 20 mmol/L — ABNORMAL LOW (ref 22–32)
Calcium: 9.2 mg/dL (ref 8.9–10.3)
Chloride: 106 mmol/L (ref 98–111)
Creatinine, Ser: 1.56 mg/dL — ABNORMAL HIGH (ref 0.44–1.00)
GFR, Estimated: 38 mL/min — ABNORMAL LOW (ref >60)
Glucose, Bld: 163 mg/dL — ABNORMAL HIGH (ref 70–99)
Potassium: 3.4 mmol/L — ABNORMAL LOW (ref 3.5–5.1)
Sodium: 138 mmol/L (ref 135–145)
Total Bilirubin: 0.7 mg/dL (ref 0.0–1.2)
Total Protein: 6.7 g/dL (ref 6.5–8.1)

## 2024-03-29 LAB — CBC WITH DIFFERENTIAL/PLATELET
Abs Immature Granulocytes: 0.02 10*3/uL (ref 0.00–0.07)
Basophils Absolute: 0 10*3/uL (ref 0.0–0.1)
Basophils Relative: 1 %
Eosinophils Absolute: 0.1 10*3/uL (ref 0.0–0.5)
Eosinophils Relative: 2 %
HCT: 26.6 % — ABNORMAL LOW (ref 36.0–46.0)
Hemoglobin: 8.7 g/dL — ABNORMAL LOW (ref 12.0–15.0)
Immature Granulocytes: 0 %
Lymphocytes Relative: 11 %
Lymphs Abs: 0.5 10*3/uL — ABNORMAL LOW (ref 0.7–4.0)
MCH: 33.2 pg (ref 26.0–34.0)
MCHC: 32.7 g/dL (ref 30.0–36.0)
MCV: 101.5 fL — ABNORMAL HIGH (ref 80.0–100.0)
Monocytes Absolute: 0.5 10*3/uL (ref 0.1–1.0)
Monocytes Relative: 11 %
Neutro Abs: 3.6 10*3/uL (ref 1.7–7.7)
Neutrophils Relative %: 75 %
Platelets: 317 10*3/uL (ref 150–400)
RBC: 2.62 MIL/uL — ABNORMAL LOW (ref 3.87–5.11)
RDW: 20.4 % — ABNORMAL HIGH (ref 11.5–15.5)
WBC: 4.7 10*3/uL (ref 4.0–10.5)
nRBC: 0 % (ref 0.0–0.2)

## 2024-03-29 LAB — IRON AND TIBC
Iron: 59 ug/dL (ref 28–170)
Saturation Ratios: 17 % (ref 10.4–31.8)
TIBC: 341 ug/dL (ref 250–450)
UIBC: 282 ug/dL

## 2024-03-29 LAB — MAGNESIUM: Magnesium: 1.6 mg/dL — ABNORMAL LOW (ref 1.7–2.4)

## 2024-03-29 LAB — FERRITIN: Ferritin: 331 ng/mL — ABNORMAL HIGH (ref 11–307)

## 2024-03-29 MED ORDER — MAGNESIUM SULFATE 2 GM/50ML IV SOLN
2.0000 g | Freq: Once | INTRAVENOUS | Status: AC
  Administered 2024-03-29: 2 g via INTRAVENOUS
  Filled 2024-03-29: qty 50

## 2024-03-29 MED ORDER — SODIUM CHLORIDE 0.9 % IV SOLN
80.0000 mg/m2 | Freq: Once | INTRAVENOUS | Status: AC
  Administered 2024-03-29: 156 mg via INTRAVENOUS
  Filled 2024-03-29: qty 26

## 2024-03-29 MED ORDER — FAMOTIDINE IN NACL 20-0.9 MG/50ML-% IV SOLN
20.0000 mg | Freq: Once | INTRAVENOUS | Status: AC
  Administered 2024-03-29: 20 mg via INTRAVENOUS
  Filled 2024-03-29: qty 50

## 2024-03-29 MED ORDER — SODIUM CHLORIDE 0.9 % IV SOLN: INTRAVENOUS | Status: DC

## 2024-03-29 MED ORDER — POTASSIUM CHLORIDE IN NACL 20-0.9 MEQ/L-% IV SOLN
Freq: Once | INTRAVENOUS | Status: AC
  Filled 2024-03-29: qty 1000

## 2024-03-29 MED ORDER — DEXAMETHASONE SODIUM PHOSPHATE 10 MG/ML IJ SOLN
10.0000 mg | Freq: Once | INTRAMUSCULAR | Status: AC
  Administered 2024-03-29: 10 mg via INTRAVENOUS
  Filled 2024-03-29: qty 1

## 2024-03-29 MED ORDER — SODIUM CHLORIDE 0.9% FLUSH
10.0000 mL | INTRAVENOUS | Status: DC | PRN
  Administered 2024-03-29: 10 mL

## 2024-03-29 MED ORDER — POTASSIUM CHLORIDE CRYS ER 20 MEQ PO TBCR: 40.0000 meq | EXTENDED_RELEASE_TABLET | Freq: Once | ORAL | Status: DC

## 2024-03-29 MED ORDER — SODIUM CHLORIDE FLUSH 0.9 % IV SOLN
10.0000 mL | Freq: Once | INTRAVENOUS | Status: AC
  Administered 2024-03-29: 10 mL via INTRAVENOUS
  Filled 2024-03-29: qty 10

## 2024-03-29 MED ORDER — CETIRIZINE HCL 10 MG/ML IV SOLN
10.0000 mg | Freq: Once | INTRAVENOUS | Status: AC
Start: 2024-03-29 — End: 2024-03-29
  Administered 2024-03-29: 10 mg via INTRAVENOUS
  Filled 2024-03-29: qty 1

## 2024-03-29 MED ORDER — HEPARIN SOD (PORK) LOCK FLUSH 100 UNIT/ML IV SOLN
500.0000 [IU] | Freq: Once | INTRAVENOUS | Status: AC | PRN
Start: 2024-03-29 — End: 2024-03-29
  Administered 2024-03-29: 500 [IU]

## 2024-03-29 MED ORDER — ONDANSETRON HCL 4 MG/2ML IJ SOLN
8.0000 mg | Freq: Once | INTRAMUSCULAR | Status: AC
  Administered 2024-03-29: 8 mg via INTRAVENOUS
  Filled 2024-03-29: qty 4

## 2024-03-29 NOTE — Progress Notes (Signed)
 [facility] [address], [place] [address]    Clinic Day:  03/29/2024  Referring physician: Towanda Fret, MD  Patient Care Team: Towanda Fret, MD as PCP - General   ASSESSMENT & PLAN:   Assessment: 1.  T1 cN0 G3 ER/PR/HER2-left breast IDC: - Screening mammogram on 10/20/2022: Abnormal - Left breast diagnostic mammogram/ultrasound (10/26/2023): Linear oriented pleomorphic calcifications in the lower inner left breast span 5.9 cm.  There has been interval development of irregular mass associated with these calcifications in the lower inner left breast.  Ultrasound of the left breast shows irregular hypoechoic mass in the left breast at 9 o'clock position measuring 2 x 1.3 x 1.6 cm.  No abnormal lymph nodes in the left axilla. - Left breast 9:00 biopsy (11/02/2023): Poorly differentiated IDC, grade 3, ER/PR negative, HER2 (1+) by IHC.  Ki-67: 20%. - Left breast biopsy LIQ posterior extent and anterior extent (11/17/2023): DCIS, comedo, high nuclear grade. - PET scan (11/25/2023): No evidence of metastatic disease. - Left mastectomy and SLNB on 12/08/2023 by Dr. Collene Dawson - Pathology: 3.9 x 2.2 x 1.5 cm invasive poorly differentiated ductal adenocarcinoma, grade 3 with extensive necrosis, high-grade DCIS with necrosis, margins free, negative angiolymphatic invasion.  0/6 lymph nodes involved.  pT2 pN0. - Adjuvant chemotherapy with dose dense AC x 4 followed by weekly paclitaxel  x 12 was recommended. - 4 cycles of dose dense AC from 01/19/2024 through 03/01/2024 - Weekly paclitaxel  started on 03/15/2024   2. Social/Family History: -Lives at home with her daughter. Works full-time with mild physical activity at work. No tobacco use.  -Brother died of lung cancer in his early 65's with a history of tobacco use. No other family history of cancer.   3. BRIP1 heterozygosity: - We reviewed genetic testing results which was positive for BRIP1 mutation, putting her at  high risk for ovarian cancer.  Also at high risk for Fanconi anemia. - She had unilateral salpingo-oophorectomy more than 10 years ago.  I have recommended salpingo-oophorectomy of the remaining ovary.   also recommended that her daughters be tested for the same mutation.    Plan: 1.  T1 cN0 G3 triple negative left breast IDC: -She has received 2 weekly doses of paclitaxel  at 80 mg/m. - She reported nausea symptoms are better.  Energy is improving.  Does not report any worsening of neuropathy. - Labs today: Creatinine 1.56, higher than her baseline.  Mild low magnesium  and potassium levels.  She will receive 1 L of fluids with lites.  LFTs are normal.  CBC grossly normal with anemia likely from myelosuppression with hemoglobin 8.7.  Will check ferritin and iron panel today. - She will continue weekly paclitaxel  and return to clinic in 4 weeks.   2.  Peripheral neuropathy: -She has burning sensation in the toes.  At last visit we have switched her to Lyrica  75 mg twice daily.  She has started taking it and reported improvement in the burning sensation.  Will closely monitor.  Creatinine has mildly increased to 1.56 today.   3.  High risk drug monitoring: -Last 2D echo on 11/23/2023 with LVEF 55%, grade 1 diastolic dysfunction.  No signs of PND or orthopnea.   4.  Hypomagnesemia: -She cannot tolerate oral magnesium  due to nausea.  Magnesium  is 1.6 today.  She will receive IV magnesium .  She was told to try taking magnesium  1 tablet at bedtime and see if she tolerates.    Orders Placed This Encounter  Procedures   Iron and TIBC ([facility] ONLY)   Ferritin      I,Helena R Teague,acting as a scribe for [name], MD.,have documented all relevant documentation on the behalf of [name], MD,as directed by  [name], MD while in the presence of [name], MD.  I, [name] MD, have reviewed the above documentation for  accuracy and completeness, and I agree with the above.    [name], MD   4/30/202510:36 AM  CHIEF COMPLAINT:   Diagnosis: triple negative left breast cancer   Cancer Staging  Breast cancer of upper-inner quadrant of left female breast [facility]) Staging form: Breast, AJCC 8th Edition - Clinical stage from 11/15/2023: Stage IIB (cT2, cN0(sn), cM0, G3, ER-, PR-, HER2-) - Signed by [name], MD on 01/05/2024    Prior Therapy: 1. Left mastectomy and SLNB, 12/08/23 2. Dose dense AC, 01/1924 - 03/01/24  Current Therapy:  weekly paclitaxel    HISTORY OF PRESENT ILLNESS:   Oncology History  Breast cancer of upper-inner quadrant of left female breast (HCC)  11/15/2023 Initial Diagnosis   Breast cancer of upper-inner quadrant of left female breast (HCC)    Genetic Testing   Likely pathogenic variant in BRIP1 called c.3196del (p.Ser1066Hisfs*12) identified on the [facility] Common Hereditary Cancers+RNA panel. VUS in CTNNA1 called c.1070G>A (p.Arg357His) identified. The report date is 11/29/2023.  The Common Hereditary Cancers Panel + RNA offered by [facility] includes sequencing and/or deletion duplication testing of the following 48 genes: APC*, ATM*, AXIN2, BAP1, BARD1, BMPR1A, BRCA1, BRCA2, BRIP1, CDH1, CDK4, CDKN2A (p14ARF), CDKN2A (p16INK4a), CHEK2, CTNNA1, DICER1*, EPCAM*, FH*, GREM1*, HOXB13, KIT, MBD4, MEN1*, MLH1*, MSH2*, MSH3*, MSH6*, MUTYH, NF1*, NTHL1, PALB2, PDGFRA, PMS2*, POLD1*, POLE, PTEN*, RAD51C, RAD51D, SDHA*, SDHB, SDHC*, SDHD, SMAD4, SMARCA4, STK11, TP53, TSC1*, TSC2, VHL.    11/15/2023 Cancer Staging   Staging form: Breast, AJCC 8th Edition - Clinical stage from 11/15/2023: Stage IIB (cT2, cN0(sn), cM0, G3, ER-, PR-, HER2-) - Signed by [name], MD on 01/05/2024 Histopathologic type: Infiltrating duct carcinoma, NOS Stage prefix: Initial diagnosis Method of lymph node assessment: Sentinel lymph node biopsy Nuclear grade: G3 Histologic grading  system: 3 grade system   01/19/2024 -  Chemotherapy   Patient is on Treatment Plan : BREAST DOSE DENSE AC q14d / PACLitaxel  q7d        INTERVAL HISTORY:   Meredith Sanchez is a 58 y.o. female presenting to clinic today for follow up of triple negative left breast cancer. She was last seen by me on 03/15/24.  Today, she states that she is doing well overall. Her appetite level is at 50%. Her energy level is at 50%.  PAST MEDICAL HISTORY:   Past Medical History: Past Medical History:  Diagnosis Date   Allergic rhinitis    Family history of breast cancer    Hyperlipidemia    Hypertension    Myocardial infarction [facility])    Obesity     Surgical History: Past Surgical History:  Procedure Laterality Date   ABDOMINAL HYSTERECTOMY     APPENDECTOMY  11/30/2001   BREAST BIOPSY Left 11/02/2023   path pending   BREAST BIOPSY Left 11/02/2023   US  LT BREAST BX W LOC DEV 1ST LESION IMG BX SPEC US  GUIDE 11/02/2023 [facility]   BREAST BIOPSY Left 11/17/2023   MM LT BREAST BX W LOC DEV 1ST LESION IMAGE BX SPEC STEREO GUIDE 11/17/2023 [facility]   BREAST BIOPSY Left 11/17/2023   MM LT BREAST BX W LOC DEV EA AD LESION IMG  BX SPEC STEREO GUIDE 11/17/2023 [facility]   COLONOSCOPY N/A 08/20/2014   Procedure: COLONOSCOPY;  Surgeon: Alyce Jubilee, MD;  Location: [facility]ITE;  Service: Endoscopy;  Laterality: N/A;  2:15pm   LEFT OOPHORECTOMY  11/30/2001   PORTACATH PLACEMENT Right 12/08/2023   Procedure: INSERTION SUBCLAVIAN PORT-A-CATH;  Surgeon: Awilda Bogus, MD;  Location: [facility] ORS;  Service: General;  Laterality: Right;   SIMPLE MASTECTOMY WITH AXILLARY SENTINEL NODE BIOPSY Left 12/08/2023   Procedure: SIMPLE MASTECTOMY WITH AXILLARY SENTINEL NODE BIOPSY;  Surgeon: Awilda Bogus, MD;  Location: [facility] ORS;  Service: General;  Laterality: Left;   TUBAL LIGATION     VESICOVAGINAL FISTULA CLOSURE W/ TAH  11/30/2001    Social History: Social History   Socioeconomic History    Marital status: Married    Spouse name: Not on file   Number of children: 3   Years of education: Not on file   Highest education level: Not on file  Occupational History   Occupation: employed     Comment: [employer]  Tobacco Use   Smoking status: Never   Smokeless tobacco: Never  Vaping Use   Vaping status: Never Used  Substance and Sexual Activity   Alcohol use: No   Drug use: No   Sexual activity: Not on file  Other Topics Concern   Not on file  Social History Narrative   Not on file   Social Drivers of Health   Financial Resource Strain: High Risk (01/19/2024)   Overall Financial Resource Strain (CARDIA)    Difficulty of Paying Living Expenses: Very hard  Food Insecurity: Patient Declined (12/08/2023)   Hunger Vital Sign    Worried About Running Out of Food in the Last Year: Patient declined    Ran Out of Food in the Last Year: Patient declined  Transportation Needs: Patient Declined (12/08/2023)   PRAPARE - Administrator, Civil Service (Medical): Patient declined    Lack of Transportation (Non-Medical): Patient declined  Physical Activity: Inactive (12/08/2018)   Received [facility]   Exercise Vital Sign    Days of Exercise per Week: 0 days    Minutes of Exercise per Session: 0 min  Stress: Not on file  Social Connections: Not on file  Intimate Partner Violence: Patient Declined (12/08/2023)   Humiliation, Afraid, Rape, and Kick questionnaire    Fear of Current or Ex-Partner: Patient declined    Emotionally Abused: Patient declined    Physically Abused: Patient declined    Sexually Abused: Patient declined    Family History: Family History  Problem Relation Age of Onset   Diabetes Mother    Hypertension Mother    Hypertension Father    Lung cancer Brother 64       metastatic lung cancer to brain   Breast cancer Cousin 54       pat first cousin   Colon cancer Neg Hx     Current Medications:  Current Outpatient  Medications:    amlodipine -olmesartan  (AZOR ) 10-20 MG tablet, TAKE 1 TABLET BY MOUTH DAILY, Disp: 90 tablet, Rfl: 3   APPLE CIDER VINEGAR PO, Take 1 capsule by mouth daily., Disp: , Rfl:    Ascorbic Acid (VITAMIN C PO), Take 1 capsule by mouth daily., Disp: , Rfl:    ASHWAGANDHA PO, Take 2 tablets by mouth daily., Disp: , Rfl:    ASPIRIN  81 PO, Take 1 tablet by mouth daily., Disp: , Rfl:    atorvastatin  (  LIPITOR) 40 MG tablet, TAKE 1 TABLET(40 MG) BY MOUTH DAILY, Disp: 90 tablet, Rfl: 3   azelastine (ASTELIN) 0.1 % nasal spray, Place 2 sprays into both nostrils 2 (two) times daily., Disp: , Rfl:    docusate sodium  (COLACE) 100 MG capsule, Take 1 capsule (100 mg total) by mouth 2 (two) times daily as needed for mild constipation., Disp: 30 capsule, Rfl: 0   fluticasone (FLONASE) 50 MCG/ACT nasal spray, Place 2 sprays into both nostrils daily., Disp: , Rfl:    lidocaine -prilocaine  (EMLA ) cream, Apply to affected area once, Disp: 30 g, Rfl: 3   magnesium  oxide (MAG-OX) 400 (240 Mg) MG tablet, Take 1 tablet (400 mg total) by mouth daily., Disp: 30 tablet, Rfl: 3   Multiple Vitamin (MULTIVITAMIN) tablet, Take 1 tablet by mouth daily., Disp: , Rfl:    ondansetron  (ZOFRAN -ODT) 4 MG disintegrating tablet, Take 1 tablet (4 mg total) by mouth every 6 (six) hours as needed for nausea., Disp: 20 tablet, Rfl: 0   oxyCODONE  (OXY IR/ROXICODONE ) 5 MG immediate release tablet, Take 1 tablet (5 mg total) by mouth every 12 (twelve) hours as needed for severe pain (pain score 7-10) or breakthrough pain., Disp: 8 tablet, Rfl: 0   PACLitaxel  (TAXOL  IV), Inject into the vein., Disp: , Rfl:    Pegfilgrastim  (NEULASTA  [place]), Inject into the skin., Disp: , Rfl:    pregabalin  (LYRICA ) 75 MG capsule, Take 1 capsule (75 mg total) by mouth 3 (three) times daily., Disp: 90 capsule, Rfl: 3   prochlorperazine  (COMPAZINE ) 10 MG tablet, Take 1 tablet (10 mg total) by mouth every 6 (six) hours as needed for nausea or vomiting., Disp:  60 tablet, Rfl: 3   triamterene -hydrochlorothiazide  (MAXZIDE -25) 37.5-25 MG tablet, TAKE 1 TABLET BY MOUTH DAILY, Disp: 90 tablet, Rfl: 3   Allergies: Allergies  Allergen Reactions   Ace Inhibitors Shortness Of Breath    Roof of mouth  Tingling, throat closing   Penicillins     Yeast infections    Poison Ivy Extract Rash    REVIEW OF SYSTEMS:   Review of Systems  Constitutional:  Negative for chills, fatigue and fever.  HENT:   Negative for lump/mass, mouth sores, nosebleeds, sore throat and trouble swallowing.   Eyes:  Negative for eye problems.  Respiratory:  Negative for cough and shortness of breath.   Cardiovascular:  Negative for chest pain, leg swelling and palpitations.  Gastrointestinal:  Positive for nausea. Negative for abdominal pain, constipation, diarrhea and vomiting.  Genitourinary:  Negative for bladder incontinence, difficulty urinating, dysuria, frequency, hematuria and nocturia.   Musculoskeletal:  Negative for arthralgias, back pain, flank pain, myalgias and neck pain.  Skin:  Negative for itching and rash.  Neurological:  Positive for headaches and numbness. Negative for dizziness.  Hematological:  Does not bruise/bleed easily.  Psychiatric/Behavioral:  Negative for depression, sleep disturbance and suicidal ideas. The patient is not nervous/anxious.   All other systems reviewed and are negative.    VITALS:   Pulse (!) 51.  Wt Readings from Last 3 Encounters:  03/29/24 185 lb 3 oz (84 kg)  03/22/24 183 lb (83 kg)  03/15/24 181 lb (82.1 kg)    There is no height or weight on file to calculate BMI.  Performance status (ECOG): 1 - Symptomatic but completely ambulatory  PHYSICAL EXAM:   Physical Exam Vitals and nursing note reviewed. Exam conducted with a chaperone present.  Constitutional:      Appearance: Normal appearance.  Cardiovascular:  Rate and Rhythm: Normal rate and regular rhythm.     Pulses: Normal pulses.     Heart sounds: Normal  heart sounds.  Pulmonary:     Effort: Pulmonary effort is normal.     Breath sounds: Normal breath sounds.  Abdominal:     Palpations: Abdomen is soft. There is no hepatomegaly, splenomegaly or mass.     Tenderness: There is no abdominal tenderness.  Musculoskeletal:     Right lower leg: No edema.     Left lower leg: No edema.  Lymphadenopathy:     Cervical: No cervical adenopathy.     Right cervical: No superficial, deep or posterior cervical adenopathy.    Left cervical: No superficial, deep or posterior cervical adenopathy.     Upper Body:     Right upper body: No supraclavicular or axillary adenopathy.     Left upper body: No supraclavicular or axillary adenopathy.  Neurological:     General: No focal deficit present.     Mental Status: She is alert and oriented to person, place, and time.  Psychiatric:        Mood and Affect: Mood normal.        Behavior: Behavior normal.     LABS:   CBC     Component Value Date/Time   WBC 4.7 03/29/2024 0859   RBC 2.62 (L) 03/29/2024 0859   HGB 8.7 (L) 03/29/2024 0859   HGB 12.7 04/09/2023 1417   HCT 26.6 (L) 03/29/2024 0859   HCT 36.4 04/09/2023 1417   PLT 317 03/29/2024 0859   PLT 359 04/09/2023 1417   MCV 101.5 (H) 03/29/2024 0859   MCV 91 04/09/2023 1417   MCH 33.2 03/29/2024 0859   MCHC 32.7 03/29/2024 0859   RDW 20.4 (H) 03/29/2024 0859   RDW 13.7 04/09/2023 1417   LYMPHSABS 0.5 (L) 03/29/2024 0859   MONOABS 0.5 03/29/2024 0859   EOSABS 0.1 03/29/2024 0859   BASOSABS 0.0 03/29/2024 0859    CMP      Component Value Date/Time   NA 138 03/29/2024 0859   NA 140 09/10/2023 1343   K 3.4 (L) 03/29/2024 0859   CL 106 03/29/2024 0859   CO2 20 (L) 03/29/2024 0859   GLUCOSE 163 (H) 03/29/2024 0859   BUN 28 (H) 03/29/2024 0859   BUN 27 (H) 09/10/2023 1343   CREATININE 1.56 (H) 03/29/2024 0859   CREATININE 0.81 01/10/2020 0908   CALCIUM  9.2 03/29/2024 0859   PROT 6.7 03/29/2024 0859   PROT 7.2 09/10/2023 1343    ALBUMIN 4.0 03/29/2024 0859   ALBUMIN 4.5 09/10/2023 1343   AST 32 03/29/2024 0859   ALT 40 03/29/2024 0859   ALKPHOS 50 03/29/2024 0859   BILITOT 0.7 03/29/2024 0859   BILITOT 0.5 09/10/2023 1343   GFRNONAA 38 (L) 03/29/2024 0859   GFRNONAA 82 01/10/2020 0908   GFRAA 95 01/10/2020 0908     No results found for: "CEA1", "CEA" / No results found for: "CEA1", "CEA" No results found for: "PSA1" No results found for: "DXI338" No results found for: "CAN125"  No results found for: "TOTALPROTELP", "ALBUMINELP", "A1GS", "A2GS", "BETS", "BETA2SER", "GAMS", "MSPIKE", "SPEI" Lab Results  Component Value Date   TIBC 344 08/17/2014   FERRITIN 348 (H) 08/17/2014   FERRITIN 134 07/09/2014   IRONPCTSAT 28 08/17/2014   No results found for: "LDH"   STUDIES:   No results found.

## 2024-03-29 NOTE — Patient Instructions (Signed)
 [facility] [place] - A DEPT OF [facility]  Discharge Instructions: Thank you for choosing [facility] to provide your oncology and hematology care.  If you have a lab appointment with the Cancer Center - please note that after April 8th, 2024, all labs will be drawn in the cancer center.  You do not have to check in or register with the main entrance as you have in the past but will complete your check-in in the cancer center.  Wear comfortable clothing and clothing appropriate for easy access to any Portacath or PICC line.   We strive to give you quality time with your provider. You may need to reschedule your appointment if you arrive late (15 or more minutes).  Arriving late affects you and other patients whose appointments are after yours.  Also, if you miss three or more appointments without notifying the office, you may be dismissed from the clinic at the provider's discretion.      For prescription refill requests, have your pharmacy contact our office and allow 72 hours for refills to be completed.    Today you received the following chemotherapy and/or immunotherapy agents Taxol.  Paclitaxel Injection What is this medication? PACLITAXEL (PAK li TAX el) treats some types of cancer. It works by slowing down the growth of cancer cells. This medicine may be used for other purposes; ask your health care provider or pharmacist if you have questions. COMMON BRAND NAME(S): Onxol, Taxol What should I tell my care team before I take this medication? They need to know if you have any of these conditions: Heart disease Liver disease Low white blood cell levels An unusual or allergic reaction to paclitaxel, other medications, foods, dyes, or preservatives If you or your partner are pregnant or trying to get pregnant Breast-feeding How should I use this medication? This medication is injected into a vein. It is given by your care team in a hospital or  clinic setting. Talk to your care team about the use of this medication in children. While it may be given to children for selected conditions, precautions do apply. Overdosage: If you think you have taken too much of this medicine contact a poison control center or emergency room at once. NOTE: This medicine is only for you. Do not share this medicine with others. What if I miss a dose? Keep appointments for follow-up doses. It is important not to miss your dose. Call your care team if you are unable to keep an appointment. What may interact with this medication? Do not take this medication with any of the following: Live virus vaccines Other medications may affect the way this medication works. Talk with your care team about all of the medications you take. They may suggest changes to your treatment plan to lower the risk of side effects and to make sure your medications work as intended. This list may not describe all possible interactions. Give your health care provider a list of all the medicines, herbs, non-prescription drugs, or dietary supplements you use. Also tell them if you smoke, drink alcohol, or use illegal drugs. Some items may interact with your medicine. What should I watch for while using this medication? Your condition will be monitored carefully while you are receiving this medication. You may need blood work while taking this medication. This medication may make you feel generally unwell. This is not uncommon as chemotherapy can affect healthy cells as well as cancer cells. Report any  side effects. Continue your course of treatment even though you feel ill unless your care team tells you to stop. This medication can cause serious allergic reactions. To reduce the risk, your care team may give you other medications to take before receiving this one. Be sure to follow the directions from your care team. This medication may increase your risk of getting an infection. Call your care  team for advice if you get a fever, chills, sore throat, or other symptoms of a cold or flu. Do not treat yourself. Try to avoid being around people who are sick. This medication may increase your risk to bruise or bleed. Call your care team if you notice any unusual bleeding. Be careful brushing or flossing your teeth or using a toothpick because you may get an infection or bleed more easily. If you have any dental work done, tell your dentist you are receiving this medication. Talk to your care team if you may be pregnant. Serious birth defects can occur if you take this medication during pregnancy. Talk to your care team before breastfeeding. Changes to your treatment plan may be needed. What side effects may I notice from receiving this medication? Side effects that you should report to your care team as soon as possible: Allergic reactions--skin rash, itching, hives, swelling of the face, lips, tongue, or throat Heart rhythm changes--fast or irregular heartbeat, dizziness, feeling faint or lightheaded, chest pain, trouble breathing Increase in blood pressure Infection--fever, chills, cough, sore throat, wounds that don't heal, pain or trouble when passing urine, general feeling of discomfort or being unwell Low blood pressure--dizziness, feeling faint or lightheaded, blurry vision Low red blood cell level--unusual weakness or fatigue, dizziness, headache, trouble breathing Painful swelling, warmth, or redness of the skin, blisters or sores at the infusion site Pain, tingling, or numbness in the hands or feet Slow heartbeat--dizziness, feeling faint or lightheaded, confusion, trouble breathing, unusual weakness or fatigue Unusual bruising or bleeding Side effects that usually do not require medical attention (report to your care team if they continue or are bothersome): Diarrhea Hair loss Joint pain Loss of appetite Muscle pain Nausea Vomiting This list may not describe all possible side  effects. Call your doctor for medical advice about side effects. You may report side effects to FDA at 1-800-FDA-1088. Where should I keep my medication? This medication is given in a hospital or clinic. It will not be stored at home. NOTE: This sheet is a summary. It may not cover all possible information. If you have questions about this medicine, talk to your doctor, pharmacist, or health care provider.  2024 Elsevier/Gold Standard (2022-04-07 00:00:00)       To help prevent nausea and vomiting after your treatment, we encourage you to take your nausea medication as directed.  BELOW ARE SYMPTOMS THAT SHOULD BE REPORTED IMMEDIATELY: *FEVER GREATER THAN 100.4 F (38 C) OR HIGHER *CHILLS OR SWEATING *NAUSEA AND VOMITING THAT IS NOT CONTROLLED WITH YOUR NAUSEA MEDICATION *UNUSUAL SHORTNESS OF BREATH *UNUSUAL BRUISING OR BLEEDING *URINARY PROBLEMS (pain or burning when urinating, or frequent urination) *BOWEL PROBLEMS (unusual diarrhea, constipation, pain near the anus) TENDERNESS IN MOUTH AND THROAT WITH OR WITHOUT PRESENCE OF ULCERS (sore throat, sores in mouth, or a toothache) UNUSUAL RASH, SWELLING OR PAIN  UNUSUAL VAGINAL DISCHARGE OR ITCHING   Items with * indicate a potential emergency and should be followed up as soon as possible or go to the Emergency Department if any problems should occur.  Please show the CHEMOTHERAPY ALERT  CARD or IMMUNOTHERAPY ALERT CARD at check-in to the Emergency Department and triage nurse.  Should you have questions after your visit or need to cancel or reschedule your appointment, please contact [facility] [place] - A DEPT OF [facility] [phone]  and follow the prompts.  Office hours are 8:00 a.m. to 4:30 p.m. Monday - Friday. Please note that voicemails left after 4:00 p.m. may not be returned until the following business day.  We are closed weekends and major holidays. You have access to a nurse at all times for urgent  questions. Please call the main number to the clinic [phone] and follow the prompts.  For any non-urgent questions, you may also contact your provider using MyChart. We now offer e-Visits for anyone 19 and older to request care online for non-urgent symptoms. For details visit [email or web address].   Also download the MyChart app! Go to the app store, search "MyChart", open the app, select [place], and log in with your MyChart username and password.

## 2024-03-29 NOTE — Progress Notes (Signed)
 Patient has been examined by Dr. Cheree Cords. Vital signs and labs have been reviewed by MD - ANC, Creatinine (1.56), LFTs, hemoglobin, and platelets are within treatment parameters per M.D. - pt may proceed with treatment.  Primary RN and pharmacy notified.

## 2024-03-29 NOTE — Progress Notes (Signed)
 Patient presents today for follow up appointment with Dr. Katragadda and Taxol  infusion. Heart rate 110 on arrival. Labs pending.   Message received from  A. Alva Jewels RN / Dr. Katragadda to proceed with treatment. Labs and vital signs reviewed by MD.   Taxol  given today per MD orders. Tolerated infusion without adverse affects. Vital signs stable. No complaints at this time. Discharged from clinic ambulatory in stable condition. Alert and oriented x 3. F/U with [facility] as scheduled.

## 2024-03-29 NOTE — Patient Instructions (Signed)

## 2024-03-30 ENCOUNTER — Other Ambulatory Visit: Payer: Self-pay

## 2024-04-05 ENCOUNTER — Inpatient Hospital Stay: Admitting: Hematology

## 2024-04-05 ENCOUNTER — Inpatient Hospital Stay: Attending: Hematology

## 2024-04-05 ENCOUNTER — Other Ambulatory Visit: Payer: Self-pay

## 2024-04-05 ENCOUNTER — Inpatient Hospital Stay

## 2024-04-05 VITALS — BP 124/69 | HR 113 | Resp 20 | Wt 185.1 lb

## 2024-04-05 VITALS — BP 125/71 | HR 87 | Temp 97.4°F | Resp 18

## 2024-04-05 DIAGNOSIS — Z452 Encounter for adjustment and management of vascular access device: Secondary | ICD-10-CM | POA: Insufficient documentation

## 2024-04-05 DIAGNOSIS — Z5111 Encounter for antineoplastic chemotherapy: Secondary | ICD-10-CM | POA: Diagnosis present

## 2024-04-05 DIAGNOSIS — C50812 Malignant neoplasm of overlapping sites of left female breast: Secondary | ICD-10-CM | POA: Insufficient documentation

## 2024-04-05 DIAGNOSIS — C50212 Malignant neoplasm of upper-inner quadrant of left female breast: Secondary | ICD-10-CM

## 2024-04-05 DIAGNOSIS — G629 Polyneuropathy, unspecified: Secondary | ICD-10-CM | POA: Diagnosis not present

## 2024-04-05 DIAGNOSIS — Z95828 Presence of other vascular implants and grafts: Secondary | ICD-10-CM

## 2024-04-05 DIAGNOSIS — Z171 Estrogen receptor negative status [ER-]: Secondary | ICD-10-CM | POA: Diagnosis not present

## 2024-04-05 DIAGNOSIS — Z1722 Progesterone receptor negative status: Secondary | ICD-10-CM | POA: Diagnosis not present

## 2024-04-05 LAB — CBC WITH DIFFERENTIAL/PLATELET
Abs Immature Granulocytes: 0.01 10*3/uL (ref 0.00–0.07)
Basophils Absolute: 0 10*3/uL (ref 0.0–0.1)
Basophils Relative: 1 %
Eosinophils Absolute: 0.1 10*3/uL (ref 0.0–0.5)
Eosinophils Relative: 2 %
HCT: 28.4 % — ABNORMAL LOW (ref 36.0–46.0)
Hemoglobin: 9.2 g/dL — ABNORMAL LOW (ref 12.0–15.0)
Immature Granulocytes: 0 %
Lymphocytes Relative: 13 %
Lymphs Abs: 0.4 10*3/uL — ABNORMAL LOW (ref 0.7–4.0)
MCH: 33.1 pg (ref 26.0–34.0)
MCHC: 32.4 g/dL (ref 30.0–36.0)
MCV: 102.2 fL — ABNORMAL HIGH (ref 80.0–100.0)
Monocytes Absolute: 0.3 10*3/uL (ref 0.1–1.0)
Monocytes Relative: 10 %
Neutro Abs: 2.5 10*3/uL (ref 1.7–7.7)
Neutrophils Relative %: 74 %
Platelets: 297 10*3/uL (ref 150–400)
RBC: 2.78 MIL/uL — ABNORMAL LOW (ref 3.87–5.11)
RDW: 19.2 % — ABNORMAL HIGH (ref 11.5–15.5)
WBC: 3.4 10*3/uL — ABNORMAL LOW (ref 4.0–10.5)
nRBC: 0 % (ref 0.0–0.2)

## 2024-04-05 LAB — MAGNESIUM: Magnesium: 1.4 mg/dL — ABNORMAL LOW (ref 1.7–2.4)

## 2024-04-05 LAB — COMPREHENSIVE METABOLIC PANEL WITH GFR
ALT: 35 U/L (ref 0–44)
AST: 30 U/L (ref 15–41)
Albumin: 4.2 g/dL (ref 3.5–5.0)
Alkaline Phosphatase: 53 U/L (ref 38–126)
Anion gap: 13 (ref 5–15)
BUN: 30 mg/dL — ABNORMAL HIGH (ref 6–20)
CO2: 20 mmol/L — ABNORMAL LOW (ref 22–32)
Calcium: 9.3 mg/dL (ref 8.9–10.3)
Chloride: 102 mmol/L (ref 98–111)
Creatinine, Ser: 1.32 mg/dL — ABNORMAL HIGH (ref 0.44–1.00)
GFR, Estimated: 47 mL/min — ABNORMAL LOW (ref >60)
Glucose, Bld: 207 mg/dL — ABNORMAL HIGH (ref 70–99)
Potassium: 3.5 mmol/L (ref 3.5–5.1)
Sodium: 135 mmol/L (ref 135–145)
Total Bilirubin: 0.7 mg/dL (ref 0.0–1.2)
Total Protein: 6.9 g/dL (ref 6.5–8.1)

## 2024-04-05 MED ORDER — HEPARIN SOD (PORK) LOCK FLUSH 100 UNIT/ML IV SOLN
500.0000 [IU] | Freq: Once | INTRAVENOUS | Status: AC | PRN
  Administered 2024-04-05: 500 [IU]

## 2024-04-05 MED ORDER — SODIUM CHLORIDE 0.9 % IV SOLN
80.0000 mg/m2 | Freq: Once | INTRAVENOUS | Status: AC
  Administered 2024-04-05: 156 mg via INTRAVENOUS
  Filled 2024-04-05: qty 26

## 2024-04-05 MED ORDER — ONDANSETRON HCL 4 MG/2ML IJ SOLN
8.0000 mg | Freq: Once | INTRAMUSCULAR | Status: AC
  Administered 2024-04-05: 8 mg via INTRAVENOUS
  Filled 2024-04-05: qty 4

## 2024-04-05 MED ORDER — SODIUM CHLORIDE 0.9 % IV SOLN: INTRAVENOUS | Status: DC

## 2024-04-05 MED ORDER — SODIUM CHLORIDE 0.9% FLUSH
10.0000 mL | INTRAVENOUS | Status: DC | PRN
  Administered 2024-04-05: 10 mL

## 2024-04-05 MED ORDER — CETIRIZINE HCL 10 MG/ML IV SOLN
10.0000 mg | Freq: Once | INTRAVENOUS | Status: AC
  Administered 2024-04-05: 10 mg via INTRAVENOUS
  Filled 2024-04-05: qty 1

## 2024-04-05 MED ORDER — FAMOTIDINE IN NACL 20-0.9 MG/50ML-% IV SOLN
20.0000 mg | Freq: Once | INTRAVENOUS | Status: AC
  Administered 2024-04-05: 20 mg via INTRAVENOUS
  Filled 2024-04-05: qty 50

## 2024-04-05 MED ORDER — DEXAMETHASONE SODIUM PHOSPHATE 10 MG/ML IJ SOLN
10.0000 mg | Freq: Once | INTRAMUSCULAR | Status: AC
  Administered 2024-04-05: 10 mg via INTRAVENOUS
  Filled 2024-04-05: qty 1

## 2024-04-05 MED ORDER — SODIUM CHLORIDE FLUSH 0.9 % IV SOLN
10.0000 mL | Freq: Once | INTRAVENOUS | Status: AC
  Administered 2024-04-05: 10 mL via INTRAVENOUS
  Filled 2024-04-05: qty 10

## 2024-04-05 MED ORDER — MAGNESIUM SULFATE 4 GM/100ML IV SOLN
4.0000 g | Freq: Once | INTRAVENOUS | Status: AC
  Administered 2024-04-05: 4 g via INTRAVENOUS
  Filled 2024-04-05: qty 100

## 2024-04-05 NOTE — Patient Instructions (Signed)
 [facility] [place] - A DEPT OF [facility]  Discharge Instructions: Thank you for choosing  Cancer Center to provide your oncology and hematology care.  If you have a lab appointment with the Cancer Center - please note that after April 8th, 2024, all labs will be drawn in the cancer center.  You do not have to check in or register with the main entrance as you have in the past but will complete your check-in in the cancer center.  Wear comfortable clothing and clothing appropriate for easy access to any Portacath or PICC line.   We strive to give you quality time with your provider. You may need to reschedule your appointment if you arrive late (15 or more minutes).  Arriving late affects you and other patients whose appointments are after yours.  Also, if you miss three or more appointments without notifying the office, you may be dismissed from the clinic at the provider's discretion.      For prescription refill requests, have your pharmacy contact our office and allow 72 hours for refills to be completed.    Today you received the following chemotherapy and/or immunotherapy agents Paclitaxel  and 4g IV magnesium  sulfate   To help prevent nausea and vomiting after your treatment, we encourage you to take your nausea medication as directed.  Paclitaxel  Injection What is this medication? PACLITAXEL  (PAK li TAX el) treats some types of cancer. It works by slowing down the growth of cancer cells. This medicine may be used for other purposes; ask your health care provider or pharmacist if you have questions. COMMON BRAND NAME(S): Onxol, Taxol  What should I tell my care team before I take this medication? They need to know if you have any of these conditions: Heart disease Liver disease Low white blood cell levels An unusual or allergic reaction to paclitaxel , other medications, foods, dyes, or preservatives If you or your partner are pregnant or trying to  get pregnant Breast-feeding How should I use this medication? This medication is injected into a vein. It is given by your care team in a hospital or clinic setting. Talk to your care team about the use of this medication in children. While it may be given to children for selected conditions, precautions do apply. Overdosage: If you think you have taken too much of this medicine contact a poison control center or emergency room at once. NOTE: This medicine is only for you. Do not share this medicine with others. What if I miss a dose? Keep appointments for follow-up doses. It is important not to miss your dose. Call your care team if you are unable to keep an appointment. What may interact with this medication? Do not take this medication with any of the following: Live virus vaccines Other medications may affect the way this medication works. Talk with your care team about all of the medications you take. They may suggest changes to your treatment plan to lower the risk of side effects and to make sure your medications work as intended. This list may not describe all possible interactions. Give your health care provider a list of all the medicines, herbs, non-prescription drugs, or dietary supplements you use. Also tell them if you smoke, drink alcohol, or use illegal drugs. Some items may interact with your medicine. What should I watch for while using this medication? Your condition will be monitored carefully while you are receiving this medication. You may need blood work while taking this  medication. This medication may make you feel generally unwell. This is not uncommon as chemotherapy can affect healthy cells as well as cancer cells. Report any side effects. Continue your course of treatment even though you feel ill unless your care team tells you to stop. This medication can cause serious allergic reactions. To reduce the risk, your care team may give you other medications to take before  receiving this one. Be sure to follow the directions from your care team. This medication may increase your risk of getting an infection. Call your care team for advice if you get a fever, chills, sore throat, or other symptoms of a cold or flu. Do not treat yourself. Try to avoid being around people who are sick. This medication may increase your risk to bruise or bleed. Call your care team if you notice any unusual bleeding. Be careful brushing or flossing your teeth or using a toothpick because you may get an infection or bleed more easily. If you have any dental work done, tell your dentist you are receiving this medication. Talk to your care team if you may be pregnant. Serious birth defects can occur if you take this medication during pregnancy. Talk to your care team before breastfeeding. Changes to your treatment plan may be needed. What side effects may I notice from receiving this medication? Side effects that you should report to your care team as soon as possible: Allergic reactions--skin rash, itching, hives, swelling of the face, lips, tongue, or throat Heart rhythm changes--fast or irregular heartbeat, dizziness, feeling faint or lightheaded, chest pain, trouble breathing Increase in blood pressure Infection--fever, chills, cough, sore throat, wounds that don't heal, pain or trouble when passing urine, general feeling of discomfort or being unwell Low blood pressure--dizziness, feeling faint or lightheaded, blurry vision Low red blood cell level--unusual weakness or fatigue, dizziness, headache, trouble breathing Painful swelling, warmth, or redness of the skin, blisters or sores at the infusion site Pain, tingling, or numbness in the hands or feet Slow heartbeat--dizziness, feeling faint or lightheaded, confusion, trouble breathing, unusual weakness or fatigue Unusual bruising or bleeding Side effects that usually do not require medical attention (report to your care team if they  continue or are bothersome): Diarrhea Hair loss Joint pain Loss of appetite Muscle pain Nausea Vomiting This list may not describe all possible side effects. Call your doctor for medical advice about side effects. You may report side effects to FDA at 1-800-FDA-1088. Where should I keep my medication? This medication is given in a hospital or clinic. It will not be stored at home. NOTE: This sheet is a summary. It may not cover all possible information. If you have questions about this medicine, talk to your doctor, pharmacist, or health care provider.  2024 Elsevier/Gold Standard (2022-04-07 00:00:00)  BELOW ARE SYMPTOMS THAT SHOULD BE REPORTED IMMEDIATELY: *FEVER GREATER THAN 100.4 F (38 C) OR HIGHER *CHILLS OR SWEATING *NAUSEA AND VOMITING THAT IS NOT CONTROLLED WITH YOUR NAUSEA MEDICATION *UNUSUAL SHORTNESS OF BREATH *UNUSUAL BRUISING OR BLEEDING *URINARY PROBLEMS (pain or burning when urinating, or frequent urination) *BOWEL PROBLEMS (unusual diarrhea, constipation, pain near the anus) TENDERNESS IN MOUTH AND THROAT WITH OR WITHOUT PRESENCE OF ULCERS (sore throat, sores in mouth, or a toothache) UNUSUAL RASH, SWELLING OR PAIN  UNUSUAL VAGINAL DISCHARGE OR ITCHING   Items with * indicate a potential emergency and should be followed up as soon as possible or go to the Emergency Department if any problems should occur.  Please show the CHEMOTHERAPY  ALERT CARD or IMMUNOTHERAPY ALERT CARD at check-in to the Emergency Department and triage nurse.  Should you have questions after your visit or need to cancel or reschedule your appointment, please contact [facility]  - A DEPT OF [facility] [phone]  and follow the prompts.  Office hours are 8:00 a.m. to 4:30 p.m. Monday - Friday. Please note that voicemails left after 4:00 p.m. may not be returned until the following business day.  We are closed weekends and major holidays. You have access to a nurse  at all times for urgent questions. Please call the main number to the clinic [phone] and follow the prompts.  For any non-urgent questions, you may also contact your provider using MyChart. We now offer e-Visits for anyone 87 and older to request care online for non-urgent symptoms. For details visit [email or web address].   Also download the MyChart app! Go to the app store, search "MyChart", open the app, select [place], and log in with your MyChart username and password.

## 2024-04-05 NOTE — Progress Notes (Signed)
 Patient presents today for Paclitaxel  infusion.  Patient is in satisfactory condition with no new complaints voiced.  Vital signs are stable.  Labs reviewed and all labs are within treatment parameters. Patient will receive 4g IV magnesium  sulfate per Dr.Katragadda standing orders.  We will proceed with treatment per MD orders.    Treatment given today per MD orders. Tolerated infusion without adverse affects. Vital signs stable. No complaints at this time. Discharged from clinic ambulatory in stable condition. Alert and oriented x 3. F/U with [facility] as scheduled.

## 2024-04-12 ENCOUNTER — Inpatient Hospital Stay

## 2024-04-12 ENCOUNTER — Inpatient Hospital Stay: Admitting: Hematology

## 2024-04-12 VITALS — BP 110/62 | HR 86 | Temp 97.8°F | Resp 18 | Wt 182.8 lb

## 2024-04-12 DIAGNOSIS — C50212 Malignant neoplasm of upper-inner quadrant of left female breast: Secondary | ICD-10-CM

## 2024-04-12 DIAGNOSIS — Z5111 Encounter for antineoplastic chemotherapy: Secondary | ICD-10-CM | POA: Diagnosis not present

## 2024-04-12 DIAGNOSIS — Z171 Estrogen receptor negative status [ER-]: Secondary | ICD-10-CM

## 2024-04-12 LAB — COMPREHENSIVE METABOLIC PANEL WITH GFR
ALT: 33 U/L (ref 0–44)
AST: 23 U/L (ref 15–41)
Albumin: 4.5 g/dL (ref 3.5–5.0)
Alkaline Phosphatase: 55 U/L (ref 38–126)
Anion gap: 13 (ref 5–15)
BUN: 32 mg/dL — ABNORMAL HIGH (ref 6–20)
CO2: 21 mmol/L — ABNORMAL LOW (ref 22–32)
Calcium: 9.3 mg/dL (ref 8.9–10.3)
Chloride: 105 mmol/L (ref 98–111)
Creatinine, Ser: 1.46 mg/dL — ABNORMAL HIGH (ref 0.44–1.00)
GFR, Estimated: 41 mL/min — ABNORMAL LOW (ref >60)
Glucose, Bld: 188 mg/dL — ABNORMAL HIGH (ref 70–99)
Potassium: 3.5 mmol/L (ref 3.5–5.1)
Sodium: 139 mmol/L (ref 135–145)
Total Bilirubin: 0.6 mg/dL (ref 0.0–1.2)
Total Protein: 7.1 g/dL (ref 6.5–8.1)

## 2024-04-12 LAB — CBC WITH DIFFERENTIAL/PLATELET
Abs Immature Granulocytes: 0.02 10*3/uL (ref 0.00–0.07)
Basophils Absolute: 0 10*3/uL (ref 0.0–0.1)
Basophils Relative: 1 %
Eosinophils Absolute: 0.1 10*3/uL (ref 0.0–0.5)
Eosinophils Relative: 4 %
HCT: 28.8 % — ABNORMAL LOW (ref 36.0–46.0)
Hemoglobin: 10.2 g/dL — ABNORMAL LOW (ref 12.0–15.0)
Immature Granulocytes: 1 %
Lymphocytes Relative: 16 %
Lymphs Abs: 0.5 10*3/uL — ABNORMAL LOW (ref 0.7–4.0)
MCH: 35.5 pg — ABNORMAL HIGH (ref 26.0–34.0)
MCHC: 35.4 g/dL (ref 30.0–36.0)
MCV: 100.3 fL — ABNORMAL HIGH (ref 80.0–100.0)
Monocytes Absolute: 0.2 10*3/uL (ref 0.1–1.0)
Monocytes Relative: 8 %
Neutro Abs: 2.2 10*3/uL (ref 1.7–7.7)
Neutrophils Relative %: 70 %
Platelets: 274 10*3/uL (ref 150–400)
RBC: 2.87 MIL/uL — ABNORMAL LOW (ref 3.87–5.11)
RDW: 18.6 % — ABNORMAL HIGH (ref 11.5–15.5)
WBC: 3.1 10*3/uL — ABNORMAL LOW (ref 4.0–10.5)
nRBC: 0 % (ref 0.0–0.2)

## 2024-04-12 LAB — MAGNESIUM: Magnesium: 1.5 mg/dL — ABNORMAL LOW (ref 1.7–2.4)

## 2024-04-12 MED ORDER — SODIUM CHLORIDE 0.9% FLUSH
10.0000 mL | INTRAVENOUS | Status: DC | PRN
Start: 2024-04-12 — End: 2024-04-12
  Administered 2024-04-12: 10 mL

## 2024-04-12 MED ORDER — ALTEPLASE 2 MG IJ SOLR
2.0000 mg | Freq: Once | INTRAMUSCULAR | Status: AC
  Administered 2024-04-12: 2 mg
  Filled 2024-04-12: qty 2

## 2024-04-12 MED ORDER — ONDANSETRON HCL 4 MG/2ML IJ SOLN
8.0000 mg | Freq: Once | INTRAMUSCULAR | Status: AC
  Administered 2024-04-12: 8 mg via INTRAVENOUS
  Filled 2024-04-12: qty 4

## 2024-04-12 MED ORDER — SODIUM CHLORIDE 0.9 % IV SOLN
80.0000 mg/m2 | Freq: Once | INTRAVENOUS | Status: AC
  Administered 2024-04-12: 156 mg via INTRAVENOUS
  Filled 2024-04-12: qty 26

## 2024-04-12 MED ORDER — SODIUM CHLORIDE 0.9 % IV SOLN
INTRAVENOUS | Status: DC
Start: 2024-04-12 — End: 2024-04-12

## 2024-04-12 MED ORDER — CETIRIZINE HCL 10 MG/ML IV SOLN
10.0000 mg | Freq: Once | INTRAVENOUS | Status: AC
  Administered 2024-04-12: 10 mg via INTRAVENOUS
  Filled 2024-04-12: qty 1

## 2024-04-12 MED ORDER — MAGNESIUM SULFATE 2 GM/50ML IV SOLN
2.0000 g | Freq: Once | INTRAVENOUS | Status: AC
Start: 2024-04-12 — End: 2024-04-12
  Administered 2024-04-12: 2 g via INTRAVENOUS
  Filled 2024-04-12: qty 50

## 2024-04-12 MED ORDER — DEXAMETHASONE SODIUM PHOSPHATE 10 MG/ML IJ SOLN
10.0000 mg | Freq: Once | INTRAMUSCULAR | Status: AC
  Administered 2024-04-12: 10 mg via INTRAVENOUS
  Filled 2024-04-12: qty 1

## 2024-04-12 MED ORDER — FAMOTIDINE IN NACL 20-0.9 MG/50ML-% IV SOLN
20.0000 mg | Freq: Once | INTRAVENOUS | Status: AC
  Administered 2024-04-12: 20 mg via INTRAVENOUS
  Filled 2024-04-12: qty 50

## 2024-04-12 MED ORDER — HEPARIN SOD (PORK) LOCK FLUSH 100 UNIT/ML IV SOLN
500.0000 [IU] | Freq: Once | INTRAVENOUS | Status: AC | PRN
  Administered 2024-04-12: 500 [IU]

## 2024-04-12 NOTE — Progress Notes (Signed)
 Magnesium  1.5, 2g of magnesium  given with treatment per standing orders. Patient tolerated chemotherapy with no complaints voiced. Side effects with management reviewed understanding verbalized. Port site clean and dry with no bruising or swelling noted at site. Good blood return noted before and after administration of chemotherapy. Band aid applied. Patient left in satisfactory condition with VSS and no s/s of distress noted.

## 2024-04-12 NOTE — Patient Instructions (Signed)
 [facility] [place] - A DEPT OF [facility]  Discharge Instructions: Thank you for choosing [facility] to provide your oncology and hematology care.  If you have a lab appointment with the Cancer Center - please note that after April 8th, 2024, all labs will be drawn in the cancer center.  You do not have to check in or register with the main entrance as you have in the past but will complete your check-in in the cancer center.  Wear comfortable clothing and clothing appropriate for easy access to any Portacath or PICC line.   We strive to give you quality time with your provider. You may need to reschedule your appointment if you arrive late (15 or more minutes).  Arriving late affects you and other patients whose appointments are after yours.  Also, if you miss three or more appointments without notifying the office, you may be dismissed from the clinic at the provider's discretion.      For prescription refill requests, have your pharmacy contact our office and allow 72 hours for refills to be completed.    Today you received the following chemotherapy and/or immunotherapy agents Taxol  andn Magnesium , return as scheduled.   To help prevent nausea and vomiting after your treatment, we encourage you to take your nausea medication as directed.  BELOW ARE SYMPTOMS THAT SHOULD BE REPORTED IMMEDIATELY: *FEVER GREATER THAN 100.4 F (38 C) OR HIGHER *CHILLS OR SWEATING *NAUSEA AND VOMITING THAT IS NOT CONTROLLED WITH YOUR NAUSEA MEDICATION *UNUSUAL SHORTNESS OF BREATH *UNUSUAL BRUISING OR BLEEDING *URINARY PROBLEMS (pain or burning when urinating, or frequent urination) *BOWEL PROBLEMS (unusual diarrhea, constipation, pain near the anus) TENDERNESS IN MOUTH AND THROAT WITH OR WITHOUT PRESENCE OF ULCERS (sore throat, sores in mouth, or a toothache) UNUSUAL RASH, SWELLING OR PAIN  UNUSUAL VAGINAL DISCHARGE OR ITCHING   Items with * indicate a potential  emergency and should be followed up as soon as possible or go to the Emergency Department if any problems should occur.  Please show the CHEMOTHERAPY ALERT CARD or IMMUNOTHERAPY ALERT CARD at check-in to the Emergency Department and triage nurse.  Should you have questions after your visit or need to cancel or reschedule your appointment, please contact [facility] [place] - A DEPT OF [facility] [phone]  and follow the prompts.  Office hours are 8:00 a.m. to 4:30 p.m. Monday - Friday. Please note that voicemails left after 4:00 p.m. may not be returned until the following business day.  We are closed weekends and major holidays. You have access to a nurse at all times for urgent questions. Please call the main number to the clinic [phone] and follow the prompts.  For any non-urgent questions, you may also contact your provider using MyChart. We now offer e-Visits for anyone 39 and older to request care online for non-urgent symptoms. For details visit [email or web address].   Also download the MyChart app! Go to the app store, search "MyChart", open the app, select [place], and log in with your MyChart username and password.

## 2024-04-14 ENCOUNTER — Encounter: Payer: Self-pay | Admitting: Hematology

## 2024-04-14 ENCOUNTER — Ambulatory Visit (INDEPENDENT_AMBULATORY_CARE_PROVIDER_SITE_OTHER): Payer: Medicaid Other | Admitting: Family Medicine

## 2024-04-14 ENCOUNTER — Encounter: Payer: Self-pay | Admitting: Family Medicine

## 2024-04-14 VITALS — BP 123/73 | HR 102 | Resp 18 | Ht 66.0 in | Wt 184.0 lb

## 2024-04-14 DIAGNOSIS — I10 Essential (primary) hypertension: Secondary | ICD-10-CM

## 2024-04-14 DIAGNOSIS — Z0000 Encounter for general adult medical examination without abnormal findings: Secondary | ICD-10-CM | POA: Diagnosis not present

## 2024-04-14 DIAGNOSIS — Z90721 Acquired absence of ovaries, unilateral: Secondary | ICD-10-CM

## 2024-04-14 DIAGNOSIS — E559 Vitamin D deficiency, unspecified: Secondary | ICD-10-CM

## 2024-04-14 DIAGNOSIS — E785 Hyperlipidemia, unspecified: Secondary | ICD-10-CM

## 2024-04-14 DIAGNOSIS — Z1502 Genetic susceptibility to malignant neoplasm of ovary: Secondary | ICD-10-CM | POA: Diagnosis not present

## 2024-04-14 DIAGNOSIS — Z1211 Encounter for screening for malignant neoplasm of colon: Secondary | ICD-10-CM

## 2024-04-14 DIAGNOSIS — R7303 Prediabetes: Secondary | ICD-10-CM

## 2024-04-14 MED ORDER — LORATADINE 10 MG PO TABS: 10.0000 mg | ORAL_TABLET | Freq: Every day | ORAL | 5 refills | Status: DC

## 2024-04-14 MED ORDER — FAMOTIDINE 40 MG PO TABS: 40.0000 mg | ORAL_TABLET | Freq: Every day | ORAL | 5 refills | Status: AC

## 2024-04-14 NOTE — Patient Instructions (Addendum)
 F/U in 6 months, call if you need me sooner   Fasting lipid, TSH and vit D and HBA1C  to be drawn with next draw at hospital 04/19/2024  You  will be referred to local Gyne for consulation once I get a response from dr K as we discussed the recommendation for removal of your ovary  Your colonscopy  is due later this year, but we will take it one step at a time  It is important that you exercise regularly at least 30 minutes 5 times a week. If you develop chest pain, have severe difficulty breathing, or feel very tired, stop exercising immediately and seek medical attention  .  Thanks for choosing [facility], we consider it a privelige to serve you.

## 2024-04-15 ENCOUNTER — Other Ambulatory Visit: Payer: Self-pay

## 2024-04-16 ENCOUNTER — Encounter: Payer: Self-pay | Admitting: Family Medicine

## 2024-04-16 DIAGNOSIS — Z90721 Acquired absence of ovaries, unilateral: Secondary | ICD-10-CM | POA: Insufficient documentation

## 2024-04-16 DIAGNOSIS — Z1502 Genetic susceptibility to malignant neoplasm of ovary: Secondary | ICD-10-CM | POA: Insufficient documentation

## 2024-04-16 NOTE — Assessment & Plan Note (Signed)
 Annual exam as documented. Counseling done  re healthy lifestyle involving commitment to 150 minutes exercise per week, heart healthy diet, and attaining healthy weight.The importance of adequate sleep also discussed.  Immunization and cancer screening needs are specifically addressed at this visit.

## 2024-04-16 NOTE — Assessment & Plan Note (Signed)
 Based on genetic testing removal of remaining ovary and fallopian tube ois  recommended, she is referred toGyne for consultation  and planning, she wil need to complete her chemo for surgery can be done per Oncology

## 2024-04-16 NOTE — Progress Notes (Signed)
    Meredith Sanchez     MRN: [id number]      DOB: 12/04/1965  Chief Complaint  Patient presents with   Annual Exam    cpe    HPI: Patient is in for annual physical exam. New dx of riht breast cancer 10/2023, currently receiving chemo with 7 more weeks of  treatment and te need for oophorectomy based on gene testing are discussed and addressed. Recent labs,  will be  reviewed.when available next week Immunization is reviewed , and  updated if needed.   PE: BP 123/73   Pulse (!) 102   Resp 18   Ht 5\' 6"  (1.676 m)   Wt 184 lb (83.5 kg)   SpO2 98%   BMI 29.70 kg/m   Pleasant  female, alert and oriented x 3, in no cardio-pulmonary distress. Afebrile. HEENT No facial trauma or asymetry. Sinuses non tender.  Extra occullar muscles intact.. External ears normal, . Neck: supple, no adenopathy,JVD or thyromegaly.No bruits.  Chest: Clear to ascultation bilaterally.No crackles or wheezes. Non tender to palpation  Cardiovascular system; Heart sounds normal,  S1 and  S2 ,no S3.  No murmur, or thrill. Apical beat not displaced Peripheral pulses normal.  Abdomen: Soft, non tender, Musculoskeletal exam: Full ROM of spine, hips , shoulders and knees. No deformity ,swelling or crepitus noted. No muscle wasting or atrophy.   Neurologic: Cranial nerves 2 to 12 intact. Power, tone ,sensation and reflexes normal throughout. No disturbance in gait. No tremor.  Skin: Intact, no ulceration, erythema , scaling or rash noted. Pigmentation normal throughout  Psych; Normal mood and affect. Judgement and concentration normal   Assessment & Plan:  Encounter for annual physical exam Annual exam as documented. Counseling done  re healthy lifestyle involving commitment to 150 minutes exercise per week, heart healthy diet, and attaining healthy weight.The importance of adequate sleep also discussed.  Immunization and cancer screening needs are specifically addressed at this  visit.   History of oophorectomy, unilateral Based on genetic testing removal of remaining ovary and fallopian tube ois  recommended, she is referred toGyne for consultation  and planning, she wil need to complete her chemo for surgery can be done per Oncology

## 2024-04-19 ENCOUNTER — Inpatient Hospital Stay: Admitting: Hematology

## 2024-04-19 ENCOUNTER — Inpatient Hospital Stay

## 2024-04-19 ENCOUNTER — Encounter: Payer: Self-pay | Admitting: *Deleted

## 2024-04-19 VITALS — BP 116/70 | HR 85 | Temp 97.3°F | Resp 18

## 2024-04-19 DIAGNOSIS — C50212 Malignant neoplasm of upper-inner quadrant of left female breast: Secondary | ICD-10-CM

## 2024-04-19 DIAGNOSIS — Z5111 Encounter for antineoplastic chemotherapy: Secondary | ICD-10-CM | POA: Diagnosis not present

## 2024-04-19 LAB — COMPREHENSIVE METABOLIC PANEL WITH GFR
ALT: 35 U/L (ref 0–44)
AST: 29 U/L (ref 15–41)
Albumin: 4.1 g/dL (ref 3.5–5.0)
Alkaline Phosphatase: 45 U/L (ref 38–126)
Anion gap: 9 (ref 5–15)
BUN: 25 mg/dL — ABNORMAL HIGH (ref 6–20)
CO2: 21 mmol/L — ABNORMAL LOW (ref 22–32)
Calcium: 9.3 mg/dL (ref 8.9–10.3)
Chloride: 105 mmol/L (ref 98–111)
Creatinine, Ser: 0.98 mg/dL (ref 0.44–1.00)
GFR, Estimated: >60 mL/min (ref >60)
Glucose, Bld: 136 mg/dL — ABNORMAL HIGH (ref 70–99)
Potassium: 3.6 mmol/L (ref 3.5–5.1)
Sodium: 135 mmol/L (ref 135–145)
Total Bilirubin: 0.8 mg/dL (ref 0.0–1.2)
Total Protein: 6.9 g/dL (ref 6.5–8.1)

## 2024-04-19 LAB — CBC WITH DIFFERENTIAL/PLATELET
Abs Immature Granulocytes: 0 10*3/uL (ref 0.00–0.07)
Basophils Absolute: 0 10*3/uL (ref 0.0–0.1)
Basophils Relative: 1 %
Eosinophils Absolute: 0.1 10*3/uL (ref 0.0–0.5)
Eosinophils Relative: 4 %
HCT: 28.6 % — ABNORMAL LOW (ref 36.0–46.0)
Hemoglobin: 9.8 g/dL — ABNORMAL LOW (ref 12.0–15.0)
Immature Granulocytes: 0 %
Lymphocytes Relative: 18 %
Lymphs Abs: 0.4 10*3/uL — ABNORMAL LOW (ref 0.7–4.0)
MCH: 35.9 pg — ABNORMAL HIGH (ref 26.0–34.0)
MCHC: 34.3 g/dL (ref 30.0–36.0)
MCV: 104.8 fL — ABNORMAL HIGH (ref 80.0–100.0)
Monocytes Absolute: 0.3 10*3/uL (ref 0.1–1.0)
Monocytes Relative: 12 %
Neutro Abs: 1.6 10*3/uL — ABNORMAL LOW (ref 1.7–7.7)
Neutrophils Relative %: 65 %
Platelets: 265 10*3/uL (ref 150–400)
RBC: 2.73 MIL/uL — ABNORMAL LOW (ref 3.87–5.11)
RDW: 17.6 % — ABNORMAL HIGH (ref 11.5–15.5)
WBC: 2.5 10*3/uL — ABNORMAL LOW (ref 4.0–10.5)
nRBC: 0 % (ref 0.0–0.2)

## 2024-04-19 LAB — MAGNESIUM: Magnesium: 1.6 mg/dL — ABNORMAL LOW (ref 1.7–2.4)

## 2024-04-19 MED ORDER — SODIUM CHLORIDE 0.9 % IV SOLN: INTRAVENOUS | Status: DC

## 2024-04-19 MED ORDER — CETIRIZINE HCL 10 MG/ML IV SOLN
10.0000 mg | Freq: Once | INTRAVENOUS | Status: AC
  Administered 2024-04-19: 10 mg via INTRAVENOUS
  Filled 2024-04-19: qty 1

## 2024-04-19 MED ORDER — SODIUM CHLORIDE 0.9% FLUSH
10.0000 mL | Freq: Once | INTRAVENOUS | Status: AC
  Administered 2024-04-19: 10 mL via INTRAVENOUS

## 2024-04-19 MED ORDER — ONDANSETRON HCL 4 MG/2ML IJ SOLN
8.0000 mg | Freq: Once | INTRAMUSCULAR | Status: AC
  Administered 2024-04-19: 8 mg via INTRAVENOUS
  Filled 2024-04-19: qty 4

## 2024-04-19 MED ORDER — FUROSEMIDE 20 MG PO TABS: 20.0000 mg | ORAL_TABLET | Freq: Every day | ORAL | 2 refills | Status: AC | PRN

## 2024-04-19 MED ORDER — HEPARIN SOD (PORK) LOCK FLUSH 100 UNIT/ML IV SOLN
500.0000 [IU] | Freq: Once | INTRAVENOUS | Status: AC | PRN
  Administered 2024-04-19: 500 [IU]

## 2024-04-19 MED ORDER — SODIUM CHLORIDE 0.9% FLUSH
10.0000 mL | INTRAVENOUS | Status: DC | PRN
  Administered 2024-04-19: 10 mL

## 2024-04-19 MED ORDER — DEXAMETHASONE SODIUM PHOSPHATE 10 MG/ML IJ SOLN
10.0000 mg | Freq: Once | INTRAMUSCULAR | Status: AC
  Administered 2024-04-19: 10 mg via INTRAVENOUS
  Filled 2024-04-19: qty 1

## 2024-04-19 MED ORDER — FAMOTIDINE IN NACL 20-0.9 MG/50ML-% IV SOLN
20.0000 mg | Freq: Once | INTRAVENOUS | Status: AC
  Administered 2024-04-19: 20 mg via INTRAVENOUS
  Filled 2024-04-19: qty 50

## 2024-04-19 MED ORDER — MAGNESIUM SULFATE 2 GM/50ML IV SOLN
2.0000 g | Freq: Once | INTRAVENOUS | Status: AC
  Administered 2024-04-19: 2 g via INTRAVENOUS
  Filled 2024-04-19: qty 50

## 2024-04-19 MED ORDER — SODIUM CHLORIDE 0.9 % IV SOLN
64.0000 mg/m2 | Freq: Once | INTRAVENOUS | Status: AC
  Administered 2024-04-19: 126 mg via INTRAVENOUS
  Filled 2024-04-19: qty 21

## 2024-04-19 NOTE — Progress Notes (Signed)
 Patient presents today for Taxol . Patient reports burning of feet, swelling and peeling of feet. ANC 1.6 and Magnesium  1.6 patient okay for treatment with dose reduction per Dr. Katragadda. Pharmacy aware. Patient tolerated chemotherapy with no complaints voiced. Side effects with management reviewed understanding verbalized. Port site clean and dry with no bruising or swelling noted at site. Good blood return noted before and after administration of chemotherapy. Band aid applied. Patient left in satisfactory condition with VSS and no s/s of distress noted.

## 2024-04-19 NOTE — Patient Instructions (Signed)
 [facility] - A DEPT OF [facility]  Discharge Instructions: Thank you for choosing [facility] to provide your oncology and hematology care.  If you have a lab appointment with the Cancer Center - please note that after April 8th, 2024, all labs will be drawn in the cancer center.  You do not have to check in or register with the main entrance as you have in the past but will complete your check-in in the cancer center.  Wear comfortable clothing and clothing appropriate for easy access to any Portacath or PICC line.   We strive to give you quality time with your provider. You may need to reschedule your appointment if you arrive late (15 or more minutes).  Arriving late affects you and other patients whose appointments are after yours.  Also, if you miss three or more appointments without notifying the office, you may be dismissed from the clinic at the provider's discretion.      For prescription refill requests, have your pharmacy contact our office and allow 72 hours for refills to be completed.    Today you received the following chemotherapy and/or immunotherapy agents Taxol , and Magnesium , return as scheduled.   To help prevent nausea and vomiting after your treatment, we encourage you to take your nausea medication as directed.  BELOW ARE SYMPTOMS THAT SHOULD BE REPORTED IMMEDIATELY: *FEVER GREATER THAN 100.4 F (38 C) OR HIGHER *CHILLS OR SWEATING *NAUSEA AND VOMITING THAT IS NOT CONTROLLED WITH YOUR NAUSEA MEDICATION *UNUSUAL SHORTNESS OF BREATH *UNUSUAL BRUISING OR BLEEDING *URINARY PROBLEMS (pain or burning when urinating, or frequent urination) *BOWEL PROBLEMS (unusual diarrhea, constipation, pain near the anus) TENDERNESS IN MOUTH AND THROAT WITH OR WITHOUT PRESENCE OF ULCERS (sore throat, sores in mouth, or a toothache) UNUSUAL RASH, SWELLING OR PAIN  UNUSUAL VAGINAL DISCHARGE OR ITCHING   Items with * indicate a potential  emergency and should be followed up as soon as possible or go to the Emergency Department if any problems should occur.  Please show the CHEMOTHERAPY ALERT CARD or IMMUNOTHERAPY ALERT CARD at check-in to the Emergency Department and triage nurse.  Should you have questions after your visit or need to cancel or reschedule your appointment, please contact [facility] [place] - A DEPT OF [facility] [phone]  and follow the prompts.  Office hours are 8:00 a.m. to 4:30 p.m. Monday - Friday. Please note that voicemails left after 4:00 p.m. may not be returned until the following business day.  We are closed weekends and major holidays. You have access to a nurse at all times for urgent questions. Please call the main number to the clinic [phone] and follow the prompts.  For any non-urgent questions, you may also contact your provider using MyChart. We now offer e-Visits for anyone 16 and older to request care online for non-urgent symptoms. For details visit [email or web address].   Also download the MyChart app! Go to the app store, search "MyChart", open the app, select [facility], and log in with your MyChart username and password.

## 2024-04-19 NOTE — Progress Notes (Signed)
 Orders received to dose reduce paclitaxel  to 64 mg/m2 for today only.  MD will see patient next dose and evaluate future doses.  V.O. Dr Davina Ester, PharmD

## 2024-04-20 ENCOUNTER — Telehealth: Payer: Self-pay | Admitting: Family Medicine

## 2024-04-20 NOTE — Telephone Encounter (Unsigned)
 Copied from CRM [id number]. Topic: Clinical - Medication Question >> Apr 20, 2024  9:57 AM Oddis Bench wrote: Reason for CRM: famotidine  (PEPCID ) 40 MG tablet and loratadine  (CLARITIN ) 10 MG tablet patient is wanting to have the script written for 90 day supply instead of 30

## 2024-04-20 NOTE — Telephone Encounter (Signed)
 Pt informed that enough refill were sent in that [facility] will be able to change to 90 days. If any problems I problems I will clarify with pharmacy.

## 2024-04-25 NOTE — Progress Notes (Signed)
 Meredith Sanchez [address], [place] [address]    Clinic Day:  04/26/2024  Referring physician: Towanda Fret, MD  Patient Care Team: Meredith Fret, MD as PCP - General   ASSESSMENT & PLAN:   Assessment: 1.  T1 cN0 G3 ER/PR/HER2-left breast IDC: - Screening mammogram on 10/20/2022: Abnormal - Left breast diagnostic mammogram/ultrasound (10/26/2023): Linear oriented pleomorphic calcifications in the lower inner left breast span 5.9 cm.  There has been interval development of irregular mass associated with these calcifications in the lower inner left breast.  Ultrasound of the left breast shows irregular hypoechoic mass in the left breast at 9 o'clock position measuring 2 x 1.3 x 1.6 cm.  No abnormal lymph nodes in the left axilla. - Left breast 9:00 biopsy (11/02/2023): Poorly differentiated IDC, grade 3, ER/PR negative, HER2 (1+) by IHC.  Ki-67: 20%. - Left breast biopsy LIQ posterior extent and anterior extent (11/17/2023): DCIS, comedo, high nuclear grade. - PET scan (11/25/2023): No evidence of metastatic disease. - Left mastectomy and SLNB on 12/08/2023 by Dr. Collene Sanchez - Pathology: 3.9 x 2.2 x 1.5 cm invasive poorly differentiated ductal adenocarcinoma, grade 3 with extensive necrosis, high-grade DCIS with necrosis, margins free, negative angiolymphatic invasion.  0/6 lymph nodes involved.  pT2 pN0. - Adjuvant chemotherapy with dose dense AC x 4 followed by weekly paclitaxel  x 12 was recommended. - 4 cycles of dose dense AC from 01/19/2024 through 03/01/2024 - Weekly paclitaxel  started on 03/15/2024   2. Social/Family History: -Lives at home with her daughter. Works full-time with mild physical activity at work. No tobacco use.  -Brother died of lung cancer in his early 24's with a history of tobacco use. No other family history of cancer.   3. BRIP1 heterozygosity: - We reviewed genetic testing results which was positive for BRIP1 mutation, putting her at  high risk for ovarian cancer.  Also at high risk for Fanconi anemia. - She had unilateral salpingo-oophorectomy more than 10 years ago.  I have recommended salpingo-oophorectomy of the remaining ovary.   also recommended that her daughters be tested for the same mutation.    Plan: 1.  T1 cN0 G3 triple negative left breast IDC: - She has completed 6 weekly doses of paclitaxel . - During last week's treatment, she had ankle swelling and worsening neuropathy and skin peeling off bottom of the feet.  We have dose reduced paclitaxel  by 20%. - Skin peeling has improved.  Ankle swelling improved with Lasix as needed. - Labs today: Creatinine mildly elevated at 1.07, likely from Lasix.  Potassium is normal.  LFTs normal.  CBC with mild leukopenia stable. - She will continue paclitaxel  at 80% dose reduction.  RTC 3 weeks for follow-up.   2.  Peripheral neuropathy: - She has burning sensation in the toes, particularly worse at nighttime. - Continue Lyrica  75 mg twice daily.  I have cut back on paclitaxel  dose by 20% on week 6. - Will add amitriptyline 25 mg at bedtime as she is unable to sleep well from neuropathic pains.   3.  High risk drug monitoring: - Last 2D echo on 11/23/2023 with LVEF 55%, grade 1 diastolic dysfunction.  No signs of PND/orthopnea.   4.  Hypomagnesemia: - She cannot tolerate magnesium  due to nausea.  Magnesium  is 1.5 today.  She will receive IV magnesium .    No orders of the defined types were placed in this encounter.     Meredith Sanchez,acting as a Neurosurgeon for  Meredith Boros, MD.,have documented all relevant documentation on the behalf of Meredith Boros, MD,as directed by  Meredith Boros, MD while in the presence of Meredith Boros, MD.  I, Meredith Boros MD, have reviewed the above documentation for accuracy and completeness, and I agree with the above.     Meredith Boros, MD   5/28/20254:12 PM  CHIEF COMPLAINT:   Diagnosis:  triple negative left breast cancer   Cancer Staging  Breast cancer of upper-inner quadrant of left female breast [facility]) Staging form: Breast, AJCC 8th Edition - Clinical stage from 11/15/2023: Stage IIB (cT2, cN0(sn), cM0, G3, ER-, PR-, HER2-) - Signed by Meredith Boros, MD on 01/05/2024    Prior Therapy: 1. Left mastectomy and SLNB, 12/08/23 2. Dose dense AC, 01/1924 - 03/01/24  Current Therapy:  weekly paclitaxel    HISTORY OF PRESENT ILLNESS:   Oncology History  Breast cancer of upper-inner quadrant of left female breast (HCC)  11/15/2023 Initial Diagnosis   Breast cancer of upper-inner quadrant of left female breast (HCC)    Genetic Testing   Likely pathogenic variant in BRIP1 called c.3196del (p.Ser1066Hisfs*12) identified on the [facility] Common Hereditary Cancers+RNA panel. VUS in CTNNA1 called c.1070G>A (p.Arg357His) identified. The report date is 11/29/2023.  The Common Hereditary Cancers Panel + RNA offered by [facility] includes sequencing and/or deletion duplication testing of the following 48 genes: APC*, ATM*, AXIN2, BAP1, BARD1, BMPR1A, BRCA1, BRCA2, BRIP1, CDH1, CDK4, CDKN2A (p14ARF), CDKN2A (p16INK4a), CHEK2, CTNNA1, DICER1*, EPCAM*, FH*, GREM1*, HOXB13, KIT, MBD4, MEN1*, MLH1*, MSH2*, MSH3*, MSH6*, MUTYH, NF1*, NTHL1, PALB2, PDGFRA, PMS2*, POLD1*, POLE, PTEN*, RAD51C, RAD51D, SDHA*, SDHB, SDHC*, SDHD, SMAD4, SMARCA4, STK11, TP53, TSC1*, TSC2, VHL.    11/15/2023 Cancer Staging   Staging form: Breast, AJCC 8th Edition - Clinical stage from 11/15/2023: Stage IIB (cT2, cN0(sn), cM0, G3, ER-, PR-, HER2-) - Signed by Meredith Boros, MD on 01/05/2024 Histopathologic type: Infiltrating duct carcinoma, NOS Stage prefix: Initial diagnosis Method of lymph node assessment: Sentinel lymph node biopsy Nuclear grade: G3 Histologic grading system: 3 grade system   01/19/2024 -  Chemotherapy   Patient is on Treatment Plan : BREAST DOSE DENSE AC q14d / PACLitaxel  q7d         INTERVAL HISTORY:   Meredith Sanchez is a 58 y.o. female presenting to clinic today for follow up of triple negative left breast cancer. She was last seen by me on 03/29/24.  Today, she states that she is doing well overall. Her appetite level is at 100%. Her energy level is at 75%.  PAST MEDICAL HISTORY:   Past Medical History: Past Medical History:  Diagnosis Date   Allergic rhinitis    Family history of breast cancer    Hyperlipidemia    Hypertension    Myocardial infarction [facility])    Obesity     Surgical History: Past Surgical History:  Procedure Laterality Date   ABDOMINAL HYSTERECTOMY     APPENDECTOMY  11/30/2001   BREAST BIOPSY Left 11/02/2023   path pending   BREAST BIOPSY Left 11/02/2023   US  LT BREAST BX W LOC DEV 1ST LESION IMG BX SPEC US  GUIDE 11/02/2023 [facility]   BREAST BIOPSY Left 11/17/2023   MM LT BREAST BX W LOC DEV 1ST LESION IMAGE BX SPEC STEREO GUIDE 11/17/2023 [facility]MOGRAP[facility]   BREAST BIOPSY Left 11/17/2023   MM LT BREAST BX W LOC DEV EA AD LESION IMG BX SPEC STEREO GUIDE 11/17/2023 [facility]   COLONOSCOPY N/A 08/20/2014   Procedure: COLONOSCOPY;  Surgeon: Alyce Jubilee, MD;  Location: [facility] SUITE;  Service: Endoscopy;  Laterality: N/A;  2:15pm   LEFT OOPHORECTOMY  11/30/2001   PORTACATH PLACEMENT Right 12/08/2023   Procedure: INSERTION SUBCLAVIAN PORT-A-CATH;  Surgeon: Awilda Bogus, MD;  Location: [facility] ORS;  Service: General;  Laterality: Right;   SIMPLE MASTECTOMY WITH AXILLARY SENTINEL NODE BIOPSY Left 12/08/2023   Procedure: SIMPLE MASTECTOMY WITH AXILLARY SENTINEL NODE BIOPSY;  Surgeon: Awilda Bogus, MD;  Location: [facility] ORS;  Service: General;  Laterality: Left;   TUBAL LIGATION     VESICOVAGINAL FISTULA CLOSURE W/ TAH  11/30/2001    Social History: Social History   Socioeconomic History   Marital status: Married    Spouse name: Not on file   Number of children: 3   Years of education: Not on file   Highest education  level: Not on file  Occupational History   Occupation: employed     Comment: [employer]  Tobacco Use   Smoking status: Never   Smokeless tobacco: Never  Vaping Use   Vaping status: Never Used  Substance and Sexual Activity   Alcohol use: No   Drug use: No   Sexual activity: Not on file  Other Topics Concern   Not on file  Social History Narrative   Not on file   Social Drivers of Health   Financial Resource Strain: High Risk (01/19/2024)   Overall Financial Resource Strain (CARDIA)    Difficulty of Paying Living Expenses: Very hard  Food Insecurity: Patient Declined (12/08/2023)   Hunger Vital Sign    Worried About Running Out of Food in the Last Year: Patient declined    Ran Out of Food in the Last Year: Patient declined  Transportation Needs: Patient Declined (12/08/2023)   PRAPARE - Administrator, Civil Service (Medical): Patient declined    Lack of Transportation (Non-Medical): Patient declined  Physical Activity: Inactive (12/08/2018)   Received from [facility]   Exercise Vital Sign    Days of Exercise per Week: 0 days    Minutes of Exercise per Session: 0 min  Stress: Not on file  Social Connections: Not on file  Intimate Partner Violence: Patient Declined (12/08/2023)   Humiliation, Afraid, Rape, and Kick questionnaire    Fear of Current or Ex-Partner: Patient declined    Emotionally Abused: Patient declined    Physically Abused: Patient declined    Sexually Abused: Patient declined    Family History: Family History  Problem Relation Age of Onset   Diabetes Mother    Hypertension Mother    Hypertension Father    Lung cancer Brother 84       metastatic lung cancer to brain   Breast cancer Cousin 83       pat first cousin   Colon cancer Neg Hx     Current Medications:  Current Outpatient Medications:    amitriptyline (ELAVIL) 25 MG tablet, Take 1 tablet (25 mg total) by mouth at bedtime., Disp: 30 tablet, Rfl: 1    amlodipine -olmesartan  (AZOR ) 10-20 MG tablet, TAKE 1 TABLET BY MOUTH DAILY, Disp: 90 tablet, Rfl: 3   APPLE CIDER VINEGAR PO, Take 1 capsule by mouth daily., Disp: , Rfl:    Ascorbic Acid (VITAMIN C PO), Take 1 capsule by mouth daily., Disp: , Rfl:    ASHWAGANDHA PO, Take 2 tablets by mouth daily., Disp: , Rfl:    ASPIRIN  81 PO, Take 1 tablet by mouth daily., Disp: , Rfl:    atorvastatin  (LIPITOR)  40 MG tablet, TAKE 1 TABLET(40 MG) BY MOUTH DAILY, Disp: 90 tablet, Rfl: 3   docusate sodium  (COLACE) 100 MG capsule, Take 1 capsule (100 mg total) by mouth 2 (two) times daily as needed for mild constipation., Disp: 30 capsule, Rfl: 0   famotidine  (PEPCID ) 40 MG tablet, Take 1 tablet (40 mg total) by mouth daily., Disp: 30 tablet, Rfl: 5   furosemide (LASIX) 20 MG tablet, Take 1 tablet (20 mg total) by mouth daily as needed for edema., Disp: 30 tablet, Rfl: 2   lidocaine -prilocaine  (EMLA ) cream, Apply to affected area once, Disp: 30 g, Rfl: 3   loratadine  (CLARITIN ) 10 MG tablet, Take 1 tablet (10 mg total) by mouth daily., Disp: 30 tablet, Rfl: 5   magnesium  oxide (MAG-OX) 400 (240 Mg) MG tablet, Take 1 tablet (400 mg total) by mouth daily., Disp: 30 tablet, Rfl: 3   Multiple Vitamin (MULTIVITAMIN) tablet, Take 1 tablet by mouth daily., Disp: , Rfl:    ondansetron  (ZOFRAN -ODT) 4 MG disintegrating tablet, Take 1 tablet (4 mg total) by mouth every 6 (six) hours as needed for nausea., Disp: 20 tablet, Rfl: 0   PACLitaxel  (TAXOL  IV), Inject into the vein., Disp: , Rfl:    Pegfilgrastim  (NEULASTA  [place]), Inject into the skin., Disp: , Rfl:    pregabalin  (LYRICA ) 75 MG capsule, Take 1 capsule (75 mg total) by mouth 3 (three) times daily., Disp: 90 capsule, Rfl: 3   prochlorperazine  (COMPAZINE ) 10 MG tablet, Take 1 tablet (10 mg total) by mouth every 6 (six) hours as needed for nausea or vomiting., Disp: 60 tablet, Rfl: 3   triamterene -hydrochlorothiazide  (MAXZIDE -25) 37.5-25 MG tablet, TAKE 1 TABLET BY MOUTH  DAILY, Disp: 90 tablet, Rfl: 3 No current facility-administered medications for this visit.  Facility-Administered Medications Ordered in Other Visits:    0.9 %  sodium chloride  infusion, , Intravenous, Continuous, Meredith Boros, MD, Stopped at 04/26/24 1402   sodium chloride  flush (NS) 0.9 % injection 10 mL, 10 mL, Intracatheter, PRN, Yesennia Hirota, MD, 10 mL at 04/26/24 1402   Allergies: Allergies  Allergen Reactions   Ace Inhibitors Shortness Of Breath    Roof of mouth  Tingling, throat closing   Penicillins     Yeast infections    Poison Ivy Extract Rash    REVIEW OF SYSTEMS:   Review of Systems  Constitutional:  Negative for chills, fatigue and fever.  HENT:   Negative for lump/mass, mouth sores, nosebleeds, sore throat and trouble swallowing.   Eyes:  Negative for eye problems.  Respiratory:  Negative for cough and shortness of breath.   Cardiovascular:  Negative for chest pain, leg swelling and palpitations.  Gastrointestinal:  Negative for abdominal pain, constipation, diarrhea, nausea and vomiting.  Genitourinary:  Negative for bladder incontinence, difficulty urinating, dysuria, frequency, hematuria and nocturia.   Musculoskeletal:  Negative for arthralgias, back pain, flank pain, myalgias and neck pain.  Skin:  Negative for itching and rash.  Neurological:  Positive for numbness. Negative for dizziness and headaches.  Hematological:  Does not bruise/bleed easily.  Psychiatric/Behavioral:  Negative for depression, sleep disturbance and suicidal ideas. The patient is not nervous/anxious.   All other systems reviewed and are negative.    VITALS:   There were no vitals taken for this visit.  Wt Readings from Last 3 Encounters:  04/26/24 184 lb 8.4 oz (83.7 kg)  04/19/24 186 lb 4.6 oz (84.5 kg)  04/14/24 184 lb (83.5 kg)    There is no height or weight  on file to calculate BMI.  Performance status (ECOG): 1 - Symptomatic but completely  ambulatory  PHYSICAL EXAM:   Physical Exam Vitals and nursing note reviewed. Exam conducted with a chaperone present.  Constitutional:      Appearance: Normal appearance.  Cardiovascular:     Rate and Rhythm: Normal rate and regular rhythm.     Pulses: Normal pulses.     Heart sounds: Normal heart sounds.  Pulmonary:     Effort: Pulmonary effort is normal.     Breath sounds: Normal breath sounds.  Abdominal:     Palpations: Abdomen is soft. There is no hepatomegaly, splenomegaly or mass.     Tenderness: There is no abdominal tenderness.  Musculoskeletal:     Right lower leg: No edema.     Left lower leg: No edema.  Lymphadenopathy:     Cervical: No cervical adenopathy.     Right cervical: No superficial, deep or posterior cervical adenopathy.    Left cervical: No superficial, deep or posterior cervical adenopathy.     Upper Body:     Right upper body: No supraclavicular or axillary adenopathy.     Left upper body: No supraclavicular or axillary adenopathy.  Neurological:     General: No focal deficit present.     Mental Status: She is alert and oriented to person, place, and time.  Psychiatric:        Mood and Affect: Mood normal.        Behavior: Behavior normal.     LABS:   CBC     Component Value Date/Time   WBC 3.3 (L) 04/26/2024 0952   RBC 3.19 (L) 04/26/2024 0952   HGB 10.9 (L) 04/26/2024 0952   HGB 12.7 04/09/2023 1417   HCT 33.2 (L) 04/26/2024 0952   HCT 36.4 04/09/2023 1417   PLT 313 04/26/2024 0952   PLT 359 04/09/2023 1417   MCV 104.1 (H) 04/26/2024 0952   MCV 91 04/09/2023 1417   MCH 34.2 (H) 04/26/2024 0952   MCHC 32.8 04/26/2024 0952   RDW 16.5 (H) 04/26/2024 0952   RDW 13.7 04/09/2023 1417   LYMPHSABS 0.5 (L) 04/26/2024 0952   MONOABS 0.3 04/26/2024 0952   EOSABS 0.1 04/26/2024 0952   BASOSABS 0.0 04/26/2024 0952    CMP      Component Value Date/Time   NA 137 04/26/2024 0942   NA 140 09/10/2023 1343   K 3.5 04/26/2024 0942   CL 102  04/26/2024 0942   CO2 21 (L) 04/26/2024 0942   GLUCOSE 203 (H) 04/26/2024 0942   BUN 22 (H) 04/26/2024 0942   BUN 27 (H) 09/10/2023 1343   CREATININE 1.07 (H) 04/26/2024 0942   CREATININE 0.81 01/10/2020 0908   CALCIUM  10.0 04/26/2024 0942   PROT 7.2 04/26/2024 0942   PROT 7.2 09/10/2023 1343   ALBUMIN 4.3 04/26/2024 0942   ALBUMIN 4.5 09/10/2023 1343   AST 34 04/26/2024 0942   ALT 41 04/26/2024 0942   ALKPHOS 49 04/26/2024 0942   BILITOT 1.0 04/26/2024 0942   BILITOT 0.5 09/10/2023 1343   GFRNONAA 60 (L) 04/26/2024 0942   GFRNONAA 82 01/10/2020 0908   GFRAA 95 01/10/2020 0908     No results found for: "CEA1", "CEA" / No results found for: "CEA1", "CEA" No results found for: "PSA1" No results found for: "ZOX096" No results found for: "CAN125"  No results found for: "TOTALPROTELP", "ALBUMINELP", "A1GS", "A2GS", "BETS", "BETA2SER", "GAMS", "MSPIKE", "SPEI" Lab Results  Component Value Date   TIBC  341 03/29/2024   TIBC 344 08/17/2014   FERRITIN 331 (H) 03/29/2024   FERRITIN 348 (H) 08/17/2014   FERRITIN 134 07/09/2014   IRONPCTSAT 17 03/29/2024   IRONPCTSAT 28 08/17/2014   No results found for: "LDH"   STUDIES:   No results found.

## 2024-04-26 ENCOUNTER — Inpatient Hospital Stay (HOSPITAL_BASED_OUTPATIENT_CLINIC_OR_DEPARTMENT_OTHER): Admitting: Hematology

## 2024-04-26 ENCOUNTER — Inpatient Hospital Stay

## 2024-04-26 DIAGNOSIS — C50212 Malignant neoplasm of upper-inner quadrant of left female breast: Secondary | ICD-10-CM | POA: Diagnosis not present

## 2024-04-26 DIAGNOSIS — Z5111 Encounter for antineoplastic chemotherapy: Secondary | ICD-10-CM | POA: Diagnosis not present

## 2024-04-26 DIAGNOSIS — Z171 Estrogen receptor negative status [ER-]: Secondary | ICD-10-CM

## 2024-04-26 LAB — CBC WITH DIFFERENTIAL/PLATELET
Abs Immature Granulocytes: 0 10*3/uL (ref 0.00–0.07)
Basophils Absolute: 0 10*3/uL (ref 0.0–0.1)
Basophils Relative: 0 %
Eosinophils Absolute: 0.1 10*3/uL (ref 0.0–0.5)
Eosinophils Relative: 2 %
HCT: 33.2 % — ABNORMAL LOW (ref 36.0–46.0)
Hemoglobin: 10.9 g/dL — ABNORMAL LOW (ref 12.0–15.0)
Immature Granulocytes: 0 %
Lymphocytes Relative: 16 %
Lymphs Abs: 0.5 10*3/uL — ABNORMAL LOW (ref 0.7–4.0)
MCH: 34.2 pg — ABNORMAL HIGH (ref 26.0–34.0)
MCHC: 32.8 g/dL (ref 30.0–36.0)
MCV: 104.1 fL — ABNORMAL HIGH (ref 80.0–100.0)
Monocytes Absolute: 0.3 10*3/uL (ref 0.1–1.0)
Monocytes Relative: 10 %
Neutro Abs: 2.4 10*3/uL (ref 1.7–7.7)
Neutrophils Relative %: 72 %
Platelets: 313 10*3/uL (ref 150–400)
RBC: 3.19 MIL/uL — ABNORMAL LOW (ref 3.87–5.11)
RDW: 16.5 % — ABNORMAL HIGH (ref 11.5–15.5)
WBC: 3.3 10*3/uL — ABNORMAL LOW (ref 4.0–10.5)
nRBC: 0 % (ref 0.0–0.2)

## 2024-04-26 LAB — COMPREHENSIVE METABOLIC PANEL WITH GFR
ALT: 41 U/L (ref 0–44)
AST: 34 U/L (ref 15–41)
Albumin: 4.3 g/dL (ref 3.5–5.0)
Alkaline Phosphatase: 49 U/L (ref 38–126)
Anion gap: 14 (ref 5–15)
BUN: 22 mg/dL — ABNORMAL HIGH (ref 6–20)
CO2: 21 mmol/L — ABNORMAL LOW (ref 22–32)
Calcium: 10 mg/dL (ref 8.9–10.3)
Chloride: 102 mmol/L (ref 98–111)
Creatinine, Ser: 1.07 mg/dL — ABNORMAL HIGH (ref 0.44–1.00)
GFR, Estimated: 60 mL/min — ABNORMAL LOW (ref >60)
Glucose, Bld: 203 mg/dL — ABNORMAL HIGH (ref 70–99)
Potassium: 3.5 mmol/L (ref 3.5–5.1)
Sodium: 137 mmol/L (ref 135–145)
Total Bilirubin: 1 mg/dL (ref 0.0–1.2)
Total Protein: 7.2 g/dL (ref 6.5–8.1)

## 2024-04-26 LAB — MAGNESIUM: Magnesium: 1.5 mg/dL — ABNORMAL LOW (ref 1.7–2.4)

## 2024-04-26 MED ORDER — SODIUM CHLORIDE 0.9% FLUSH
10.0000 mL | INTRAVENOUS | Status: DC | PRN
  Administered 2024-04-26: 10 mL via INTRAVENOUS

## 2024-04-26 MED ORDER — SODIUM CHLORIDE 0.9% FLUSH
10.0000 mL | INTRAVENOUS | Status: DC | PRN
  Administered 2024-04-26: 10 mL

## 2024-04-26 MED ORDER — FAMOTIDINE IN NACL 20-0.9 MG/50ML-% IV SOLN
20.0000 mg | Freq: Once | INTRAVENOUS | Status: AC
  Administered 2024-04-26: 20 mg via INTRAVENOUS
  Filled 2024-04-26: qty 50

## 2024-04-26 MED ORDER — AMITRIPTYLINE HCL 25 MG PO TABS: 25.0000 mg | ORAL_TABLET | Freq: Every day | ORAL | 1 refills | Status: AC

## 2024-04-26 MED ORDER — MAGNESIUM SULFATE 2 GM/50ML IV SOLN
2.0000 g | Freq: Once | INTRAVENOUS | Status: AC
  Administered 2024-04-26: 2 g via INTRAVENOUS
  Filled 2024-04-26: qty 50

## 2024-04-26 MED ORDER — SODIUM CHLORIDE 0.9 % IV SOLN: INTRAVENOUS | Status: DC

## 2024-04-26 MED ORDER — CETIRIZINE HCL 10 MG/ML IV SOLN
10.0000 mg | Freq: Once | INTRAVENOUS | Status: AC
  Administered 2024-04-26: 10 mg via INTRAVENOUS
  Filled 2024-04-26: qty 1

## 2024-04-26 MED ORDER — DEXAMETHASONE SODIUM PHOSPHATE 10 MG/ML IJ SOLN
10.0000 mg | Freq: Once | INTRAMUSCULAR | Status: AC
  Administered 2024-04-26: 10 mg via INTRAVENOUS
  Filled 2024-04-26: qty 1

## 2024-04-26 MED ORDER — HEPARIN SOD (PORK) LOCK FLUSH 100 UNIT/ML IV SOLN
500.0000 [IU] | Freq: Once | INTRAVENOUS | Status: AC | PRN
  Administered 2024-04-26: 500 [IU]

## 2024-04-26 MED ORDER — SODIUM CHLORIDE 0.9 % IV SOLN
64.0000 mg/m2 | Freq: Once | INTRAVENOUS | Status: AC
  Administered 2024-04-26: 126 mg via INTRAVENOUS
  Filled 2024-04-26: qty 21

## 2024-04-26 MED ORDER — ONDANSETRON HCL 4 MG/2ML IJ SOLN
8.0000 mg | Freq: Once | INTRAMUSCULAR | Status: AC
  Administered 2024-04-26: 8 mg via INTRAVENOUS
  Filled 2024-04-26: qty 4

## 2024-04-26 NOTE — Patient Instructions (Signed)

## 2024-04-26 NOTE — Progress Notes (Signed)
 Patient okay for treatment with 20% dose reduction. Mag is 1.5, 2 grams of mag given per standing orders. Patient tolerated chemotherapy with no complaints voiced. Side effects with management reviewed understanding verbalized. Port site clean and dry with no bruising or swelling noted at site. Good blood return noted before and after administration of chemotherapy. Band aid applied. Patient left in satisfactory condition with VSS and no s/s of distress noted.

## 2024-04-26 NOTE — Patient Instructions (Signed)
 [facility] [place] - A DEPT OF [facility]  Discharge Instructions: Thank you for choosing [facility] to provide your oncology and hematology care.  If you have a lab appointment with the Cancer Center - please note that after April 8th, 2024, all labs will be drawn in the cancer center.  You do not have to check in or register with the main entrance as you have in the past but will complete your check-in in the cancer center.  Wear comfortable clothing and clothing appropriate for easy access to any Portacath or PICC line.   We strive to give you quality time with your provider. You may need to reschedule your appointment if you arrive late (15 or more minutes).  Arriving late affects you and other patients whose appointments are after yours.  Also, if you miss three or more appointments without notifying the office, you may be dismissed from the clinic at the provider's discretion.      For prescription refill requests, have your pharmacy contact our office and allow 72 hours for refills to be completed.    Today you received the following chemotherapy and/or immunotherapy agents Taxol,return as scheduled.   To help prevent nausea and vomiting after your treatment, we encourage you to take your nausea medication as directed.  BELOW ARE SYMPTOMS THAT SHOULD BE REPORTED IMMEDIATELY: *FEVER GREATER THAN 100.4 F (38 C) OR HIGHER *CHILLS OR SWEATING *NAUSEA AND VOMITING THAT IS NOT CONTROLLED WITH YOUR NAUSEA MEDICATION *UNUSUAL SHORTNESS OF BREATH *UNUSUAL BRUISING OR BLEEDING *URINARY PROBLEMS (pain or burning when urinating, or frequent urination) *BOWEL PROBLEMS (unusual diarrhea, constipation, pain near the anus) TENDERNESS IN MOUTH AND THROAT WITH OR WITHOUT PRESENCE OF ULCERS (sore throat, sores in mouth, or a toothache) UNUSUAL RASH, SWELLING OR PAIN  UNUSUAL VAGINAL DISCHARGE OR ITCHING   Items with * indicate a potential emergency and should  be followed up as soon as possible or go to the Emergency Department if any problems should occur.  Please show the CHEMOTHERAPY ALERT CARD or IMMUNOTHERAPY ALERT CARD at check-in to the Emergency Department and triage nurse.  Should you have questions after your visit or need to cancel or reschedule your appointment, please contact [facility] [place] - A DEPT OF Eligha Bridegroom [facility] [phone]  and follow the prompts.  Office hours are 8:00 a.m. to 4:30 p.m. Monday - Friday. Please note that voicemails left after 4:00 p.m. may not be returned until the following business day.  We are closed weekends and major holidays. You have access to a nurse at all times for urgent questions. Please call the main number to the clinic [phone] and follow the prompts.  For any non-urgent questions, you may also contact your provider using MyChart. We now offer e-Visits for anyone 40 and older to request care online for non-urgent symptoms. For details visit [email or web address].   Also download the MyChart app! Go to the app store, search "MyChart", open the app, select [place], and log in with your MyChart username and password.

## 2024-04-27 ENCOUNTER — Inpatient Hospital Stay: Admitting: Dietician

## 2024-04-27 ENCOUNTER — Telehealth: Payer: Self-pay | Admitting: Dietician

## 2024-04-27 NOTE — Telephone Encounter (Signed)
 Nutrition Follow-up:  Pt with left IDC, triple negative. S/p left mastectomy with SNLB (1/8) under the care of Dr. Collene Dawson. Patient completed adjuvant chemotherapy with AC x 4 cycles. Currently receiving weekly paclitaxel  x 12 weeks (start 4/17).   Spoke with patient via telephone. She reports tolerating treatment well overall. Patient had "little bit" of diarrhea last night. This has resolved. Her appetite is good. She is supplementing with an Ensure most every day. Patient says she is drinking ~96 ounces of water .    Medications: reviewed   Labs: glucose 203, BUN 22, Cr 1.07  Anthropometrics: Wt 184 lb 8.4 oz   5/14 - 182 lb 12.8 oz 4/30 - 185 lb 3 oz    NUTRITION DIAGNOSIS: Food and nutrition related knowledge deficit improving   INTERVENTION:  Continue strategies for increasing calories and protein Continue daily Ensure Complete/equivalent - will leave samples + coupons for pt to pick up at next [facility] visit 6/4    MONITORING, EVALUATION, GOAL: wt trends, intake   NEXT VISIT: To be scheduled as needed

## 2024-05-03 ENCOUNTER — Inpatient Hospital Stay: Attending: Hematology

## 2024-05-03 ENCOUNTER — Inpatient Hospital Stay

## 2024-05-03 VITALS — BP 106/69 | HR 87 | Temp 98.2°F | Resp 20

## 2024-05-03 DIAGNOSIS — Z5111 Encounter for antineoplastic chemotherapy: Secondary | ICD-10-CM | POA: Insufficient documentation

## 2024-05-03 DIAGNOSIS — Z171 Estrogen receptor negative status [ER-]: Secondary | ICD-10-CM | POA: Diagnosis not present

## 2024-05-03 DIAGNOSIS — R110 Nausea: Secondary | ICD-10-CM | POA: Insufficient documentation

## 2024-05-03 DIAGNOSIS — G629 Polyneuropathy, unspecified: Secondary | ICD-10-CM | POA: Insufficient documentation

## 2024-05-03 DIAGNOSIS — C50812 Malignant neoplasm of overlapping sites of left female breast: Secondary | ICD-10-CM | POA: Insufficient documentation

## 2024-05-03 LAB — CBC WITH DIFFERENTIAL/PLATELET
Abs Immature Granulocytes: 0.01 10*3/uL (ref 0.00–0.07)
Basophils Absolute: 0 10*3/uL (ref 0.0–0.1)
Basophils Relative: 1 %
Eosinophils Absolute: 0.1 10*3/uL (ref 0.0–0.5)
Eosinophils Relative: 2 %
HCT: 30.9 % — ABNORMAL LOW (ref 36.0–46.0)
Hemoglobin: 10.3 g/dL — ABNORMAL LOW (ref 12.0–15.0)
Immature Granulocytes: 0 %
Lymphocytes Relative: 21 %
Lymphs Abs: 0.6 10*3/uL — ABNORMAL LOW (ref 0.7–4.0)
MCH: 34.6 pg — ABNORMAL HIGH (ref 26.0–34.0)
MCHC: 33.3 g/dL (ref 30.0–36.0)
MCV: 103.7 fL — ABNORMAL HIGH (ref 80.0–100.0)
Monocytes Absolute: 0.4 10*3/uL (ref 0.1–1.0)
Monocytes Relative: 13 %
Neutro Abs: 1.9 10*3/uL (ref 1.7–7.7)
Neutrophils Relative %: 63 %
Platelets: 291 10*3/uL (ref 150–400)
RBC: 2.98 MIL/uL — ABNORMAL LOW (ref 3.87–5.11)
RDW: 15.3 % (ref 11.5–15.5)
WBC: 3 10*3/uL — ABNORMAL LOW (ref 4.0–10.5)
nRBC: 0 % (ref 0.0–0.2)

## 2024-05-03 LAB — COMPREHENSIVE METABOLIC PANEL WITH GFR
ALT: 30 U/L (ref 0–44)
AST: 29 U/L (ref 15–41)
Albumin: 4.2 g/dL (ref 3.5–5.0)
Alkaline Phosphatase: 45 U/L (ref 38–126)
Anion gap: 10 (ref 5–15)
BUN: 27 mg/dL — ABNORMAL HIGH (ref 6–20)
CO2: 22 mmol/L (ref 22–32)
Calcium: 9.4 mg/dL (ref 8.9–10.3)
Chloride: 104 mmol/L (ref 98–111)
Creatinine, Ser: 1.3 mg/dL — ABNORMAL HIGH (ref 0.44–1.00)
GFR, Estimated: 47 mL/min — ABNORMAL LOW (ref >60)
Glucose, Bld: 149 mg/dL — ABNORMAL HIGH (ref 70–99)
Potassium: 3.4 mmol/L — ABNORMAL LOW (ref 3.5–5.1)
Sodium: 136 mmol/L (ref 135–145)
Total Bilirubin: 0.9 mg/dL (ref 0.0–1.2)
Total Protein: 7.2 g/dL (ref 6.5–8.1)

## 2024-05-03 LAB — MAGNESIUM: Magnesium: 1.7 mg/dL (ref 1.7–2.4)

## 2024-05-03 MED ORDER — SODIUM CHLORIDE 0.9% FLUSH
10.0000 mL | Freq: Once | INTRAVENOUS | Status: AC
  Administered 2024-05-03: 10 mL via INTRAVENOUS

## 2024-05-03 MED ORDER — CETIRIZINE HCL 10 MG/ML IV SOLN
10.0000 mg | Freq: Once | INTRAVENOUS | Status: AC
  Administered 2024-05-03: 10 mg via INTRAVENOUS
  Filled 2024-05-03: qty 1

## 2024-05-03 MED ORDER — DEXAMETHASONE SODIUM PHOSPHATE 10 MG/ML IJ SOLN
10.0000 mg | Freq: Once | INTRAMUSCULAR | Status: AC
  Administered 2024-05-03: 10 mg via INTRAVENOUS
  Filled 2024-05-03: qty 1

## 2024-05-03 MED ORDER — SODIUM CHLORIDE 0.9% FLUSH
10.0000 mL | INTRAVENOUS | Status: DC | PRN
  Administered 2024-05-03: 10 mL

## 2024-05-03 MED ORDER — SODIUM CHLORIDE 0.9 % IV SOLN
64.0000 mg/m2 | Freq: Once | INTRAVENOUS | Status: AC
  Administered 2024-05-03: 126 mg via INTRAVENOUS
  Filled 2024-05-03: qty 21

## 2024-05-03 MED ORDER — FAMOTIDINE IN NACL 20-0.9 MG/50ML-% IV SOLN
20.0000 mg | Freq: Once | INTRAVENOUS | Status: AC
  Administered 2024-05-03: 20 mg via INTRAVENOUS
  Filled 2024-05-03: qty 50

## 2024-05-03 MED ORDER — ONDANSETRON HCL 4 MG/2ML IJ SOLN
8.0000 mg | Freq: Once | INTRAMUSCULAR | Status: AC
  Administered 2024-05-03: 8 mg via INTRAVENOUS
  Filled 2024-05-03: qty 4

## 2024-05-03 MED ORDER — SODIUM CHLORIDE 0.9 % IV SOLN: INTRAVENOUS | Status: DC

## 2024-05-03 MED ORDER — HEPARIN SOD (PORK) LOCK FLUSH 100 UNIT/ML IV SOLN
500.0000 [IU] | Freq: Once | INTRAVENOUS | Status: AC | PRN
  Administered 2024-05-03: 500 [IU]

## 2024-05-03 MED ORDER — POTASSIUM CHLORIDE CRYS ER 20 MEQ PO TBCR
40.0000 meq | EXTENDED_RELEASE_TABLET | Freq: Once | ORAL | Status: AC
  Administered 2024-05-03: 40 meq via ORAL
  Filled 2024-05-03: qty 2

## 2024-05-03 NOTE — Progress Notes (Signed)
 Patient presents today for Taxol  per providers order.  Vital signs and labs within parameters for treatment.  Potassium noted to be 3.4, per standing order patient will receive 40 mEq of PO Potassium with treatment.  Treatment given today per MD orders.  Stable during infusion without adverse affects.  Vital signs stable.  No complaints at this time.  Discharge from clinic ambulatory in stable condition.  Alert and oriented X 3.  Follow up with [facility] as scheduled.

## 2024-05-03 NOTE — Patient Instructions (Signed)
 [facility] [place] - A DEPT OF [facility]  Discharge Instructions: Thank you for choosing [facility] to provide your oncology and hematology care.  If you have a lab appointment with the Cancer Center - please note that after April 8th, 2024, all labs will be drawn in the cancer center.  You do not have to check in or register with the main entrance as you have in the past but will complete your check-in in the cancer center.  Wear comfortable clothing and clothing appropriate for easy access to any Portacath or PICC line.   We strive to give you quality time with your provider. You may need to reschedule your appointment if you arrive late (15 or more minutes).  Arriving late affects you and other patients whose appointments are after yours.  Also, if you miss three or more appointments without notifying the office, you may be dismissed from the clinic at the provider's discretion.      For prescription refill requests, have your pharmacy contact our office and allow 72 hours for refills to be completed.    Today you received the following chemotherapy and/or immunotherapy agents Taxol   To help prevent nausea and vomiting after your treatment, we encourage you to take your nausea medication as directed.  BELOW ARE SYMPTOMS THAT SHOULD BE REPORTED IMMEDIATELY: *FEVER GREATER THAN 100.4 F (38 C) OR HIGHER *CHILLS OR SWEATING *NAUSEA AND VOMITING THAT IS NOT CONTROLLED WITH YOUR NAUSEA MEDICATION *UNUSUAL SHORTNESS OF BREATH *UNUSUAL BRUISING OR BLEEDING *URINARY PROBLEMS (pain or burning when urinating, or frequent urination) *BOWEL PROBLEMS (unusual diarrhea, constipation, pain near the anus) TENDERNESS IN MOUTH AND THROAT WITH OR WITHOUT PRESENCE OF ULCERS (sore throat, sores in mouth, or a toothache) UNUSUAL RASH, SWELLING OR PAIN  UNUSUAL VAGINAL DISCHARGE OR ITCHING   Items with * indicate a potential emergency and should be followed up as  soon as possible or go to the Emergency Department if any problems should occur.  Please show the CHEMOTHERAPY ALERT CARD or IMMUNOTHERAPY ALERT CARD at check-in to the Emergency Department and triage nurse.  Should you have questions after your visit or need to cancel or reschedule your appointment, please contact [facility] CANCER CTR [place] - A DEPT OF [facility]igh[facility] Bridegroom [facility] [phone]  and follow the prompts.  Office hours are 8:00 a.m. to 4:30 p.m. Monday - Friday. Please note that voicemails left after 4:00 p.m. may not be returned until the following business day.  We are closed weekends and major holidays. You have access to a nurse at all times for urgent questions. Please call the main number to the clinic [phone] and follow the prompts.  For any non-urgent questions, you may also contact your provider using MyChart. We now offer e-Visits for anyone 40 and older to request care online for non-urgent symptoms. For details visit [email or web address].   Also download the MyChart app! Go to the app store, search "MyChart", open the app, select [place], and log in with your MyChart username and password.

## 2024-05-10 ENCOUNTER — Inpatient Hospital Stay

## 2024-05-10 VITALS — BP 119/75 | HR 91 | Temp 97.9°F | Resp 19 | Wt 184.3 lb

## 2024-05-10 DIAGNOSIS — C50212 Malignant neoplasm of upper-inner quadrant of left female breast: Secondary | ICD-10-CM

## 2024-05-10 DIAGNOSIS — Z5111 Encounter for antineoplastic chemotherapy: Secondary | ICD-10-CM | POA: Diagnosis not present

## 2024-05-10 LAB — COMPREHENSIVE METABOLIC PANEL WITH GFR
ALT: 34 U/L (ref 0–44)
AST: 31 U/L (ref 15–41)
Albumin: 3.9 g/dL (ref 3.5–5.0)
Alkaline Phosphatase: 45 U/L (ref 38–126)
Anion gap: 11 (ref 5–15)
BUN: 20 mg/dL (ref 6–20)
CO2: 21 mmol/L — ABNORMAL LOW (ref 22–32)
Calcium: 9.1 mg/dL (ref 8.9–10.3)
Chloride: 106 mmol/L (ref 98–111)
Creatinine, Ser: 1.03 mg/dL — ABNORMAL HIGH (ref 0.44–1.00)
GFR, Estimated: >60 mL/min (ref >60)
Glucose, Bld: 198 mg/dL — ABNORMAL HIGH (ref 70–99)
Potassium: 2.9 mmol/L — ABNORMAL LOW (ref 3.5–5.1)
Sodium: 138 mmol/L (ref 135–145)
Total Bilirubin: 0.8 mg/dL (ref 0.0–1.2)
Total Protein: 6.8 g/dL (ref 6.5–8.1)

## 2024-05-10 LAB — CBC WITH DIFFERENTIAL/PLATELET
Abs Immature Granulocytes: 0.01 10*3/uL (ref 0.00–0.07)
Basophils Absolute: 0 10*3/uL (ref 0.0–0.1)
Basophils Relative: 1 %
Eosinophils Absolute: 0.1 10*3/uL (ref 0.0–0.5)
Eosinophils Relative: 4 %
HCT: 31.6 % — ABNORMAL LOW (ref 36.0–46.0)
Hemoglobin: 10.4 g/dL — ABNORMAL LOW (ref 12.0–15.0)
Immature Granulocytes: 0 %
Lymphocytes Relative: 19 %
Lymphs Abs: 0.5 10*3/uL — ABNORMAL LOW (ref 0.7–4.0)
MCH: 34.3 pg — ABNORMAL HIGH (ref 26.0–34.0)
MCHC: 32.9 g/dL (ref 30.0–36.0)
MCV: 104.3 fL — ABNORMAL HIGH (ref 80.0–100.0)
Monocytes Absolute: 0.3 10*3/uL (ref 0.1–1.0)
Monocytes Relative: 10 %
Neutro Abs: 1.9 10*3/uL (ref 1.7–7.7)
Neutrophils Relative %: 66 %
Platelets: 331 10*3/uL (ref 150–400)
RBC: 3.03 MIL/uL — ABNORMAL LOW (ref 3.87–5.11)
RDW: 14.4 % (ref 11.5–15.5)
WBC: 2.8 10*3/uL — ABNORMAL LOW (ref 4.0–10.5)
nRBC: 0 % (ref 0.0–0.2)

## 2024-05-10 LAB — MAGNESIUM: Magnesium: 1.6 mg/dL — ABNORMAL LOW (ref 1.7–2.4)

## 2024-05-10 MED ORDER — MAGNESIUM SULFATE 2 GM/50ML IV SOLN
2.0000 g | Freq: Once | INTRAVENOUS | Status: AC
  Administered 2024-05-10: 2 g via INTRAVENOUS
  Filled 2024-05-10: qty 50

## 2024-05-10 MED ORDER — SODIUM CHLORIDE 0.9% FLUSH
10.0000 mL | Freq: Once | INTRAVENOUS | Status: AC
  Administered 2024-05-10: 10 mL via INTRAVENOUS

## 2024-05-10 MED ORDER — SODIUM CHLORIDE 0.9 % IV SOLN: INTRAVENOUS | Status: DC

## 2024-05-10 MED ORDER — SODIUM CHLORIDE 0.9% FLUSH
10.0000 mL | INTRAVENOUS | Status: DC | PRN
  Administered 2024-05-10: 10 mL

## 2024-05-10 MED ORDER — POTASSIUM CHLORIDE CRYS ER 20 MEQ PO TBCR
40.0000 meq | EXTENDED_RELEASE_TABLET | Freq: Once | ORAL | Status: AC
  Administered 2024-05-10: 40 meq via ORAL
  Filled 2024-05-10: qty 2

## 2024-05-10 MED ORDER — DEXAMETHASONE SODIUM PHOSPHATE 10 MG/ML IJ SOLN
10.0000 mg | Freq: Once | INTRAMUSCULAR | Status: AC
  Administered 2024-05-10: 10 mg via INTRAVENOUS
  Filled 2024-05-10: qty 1

## 2024-05-10 MED ORDER — HEPARIN SOD (PORK) LOCK FLUSH 100 UNIT/ML IV SOLN
500.0000 [IU] | Freq: Once | INTRAVENOUS | Status: AC | PRN
  Administered 2024-05-10: 500 [IU]

## 2024-05-10 MED ORDER — CETIRIZINE HCL 10 MG/ML IV SOLN
10.0000 mg | Freq: Once | INTRAVENOUS | Status: AC
  Administered 2024-05-10: 10 mg via INTRAVENOUS
  Filled 2024-05-10: qty 1

## 2024-05-10 MED ORDER — SODIUM CHLORIDE 0.9 % IV SOLN
64.0000 mg/m2 | Freq: Once | INTRAVENOUS | Status: AC
  Administered 2024-05-10: 126 mg via INTRAVENOUS
  Filled 2024-05-10: qty 21

## 2024-05-10 MED ORDER — POTASSIUM CHLORIDE CRYS ER 20 MEQ PO TBCR: 40.0000 meq | EXTENDED_RELEASE_TABLET | Freq: Once | ORAL | Status: DC

## 2024-05-10 MED ORDER — FAMOTIDINE IN NACL 20-0.9 MG/50ML-% IV SOLN
20.0000 mg | Freq: Once | INTRAVENOUS | Status: AC
  Administered 2024-05-10: 20 mg via INTRAVENOUS
  Filled 2024-05-10: qty 50

## 2024-05-10 MED ORDER — ONDANSETRON HCL 4 MG/2ML IJ SOLN
8.0000 mg | Freq: Once | INTRAMUSCULAR | Status: AC
  Administered 2024-05-10: 8 mg via INTRAVENOUS
  Filled 2024-05-10: qty 4

## 2024-05-10 NOTE — Patient Instructions (Signed)
 [facility] [place] - A DEPT OF [place]  Discharge Instructions: Thank you for choosing [facility] to provide your oncology and hematology care.  If you have a lab appointment with the Cancer Center - please note that after April 8th, 2024, all labs will be drawn in the cancer center.  You do not have to check in or register with the main entrance as you have in the past but will complete your check-in in the cancer center.  Wear comfortable clothing and clothing appropriate for easy access to any Portacath or PICC line.   We strive to give you quality time with your provider. You may need to reschedule your appointment if you arrive late (15 or more minutes).  Arriving late affects you and other patients whose appointments are after yours.  Also, if you miss three or more appointments without notifying the office, you may be dismissed from the clinic at the provider's discretion.      For prescription refill requests, have your pharmacy contact our office and allow 72 hours for refills to be completed.    Today you received the following chemotherapy and/or immunotherapy agents Paclitaxel , 40mEq potassium chloride  p.o x 1 dose and 2g IV magnesium  sulfate   To help prevent nausea and vomiting after your treatment, we encourage you to take your nausea medication as directed.  BELOW ARE SYMPTOMS THAT SHOULD BE REPORTED IMMEDIATELY: *FEVER GREATER THAN 100.4 F (38 C) OR HIGHER *CHILLS OR SWEATING *NAUSEA AND VOMITING THAT IS NOT CONTROLLED WITH YOUR NAUSEA MEDICATION *UNUSUAL SHORTNESS OF BREATH *UNUSUAL BRUISING OR BLEEDING *URINARY PROBLEMS (pain or burning when urinating, or frequent urination) *BOWEL PROBLEMS (unusual diarrhea, constipation, pain near the anus) TENDERNESS IN MOUTH AND THROAT WITH OR WITHOUT PRESENCE OF ULCERS (sore throat, sores in mouth, or a toothache) UNUSUAL RASH, SWELLING OR PAIN  UNUSUAL VAGINAL DISCHARGE OR ITCHING    Items with * indicate a potential emergency and should be followed up as soon as possible or go to the Emergency Department if any problems should occur.  Please show the CHEMOTHERAPY ALERT CARD or IMMUNOTHERAPY ALERT CARD at check-in to the Emergency Department and triage nurse.  Should you have questions after your visit or need to cancel or reschedule your appointment, please contact [facility] CTR  - A DEPT OF [facility] [phone]  and follow the prompts.  Office hours are 8:00 a.m. to 4:30 p.m. Monday - Friday. Please note that voicemails left after 4:00 p.m. may not be returned until the following business day.  We are closed weekends and major holidays. You have access to a nurse at all times for urgent questions. Please call the main number to the clinic [phone] and follow the prompts.  For any non-urgent questions, you may also contact your provider using MyChart. We now offer e-Visits for anyone 73 and older to request care online for non-urgent symptoms. For details visit [email or web address].   Also download the MyChart app! Go to the app store, search MyChart, open the app, select [place], and log in with your MyChart username and password.

## 2024-05-10 NOTE — Progress Notes (Signed)
 Patient presents today for Paclitaxel  infusion.  Patient is in satisfactory condition with no new complaints voiced.  Vital signs are stable.  Labs reviewed and all labs are within treatment parameters. Patient will receive 40 mEq potassium chloride  p.o x 1 and 2g IV magnesium  sulfate per Dr.Katragadda's standing orders. We will proceed with treatment per MD orders.    Treatment given today per MD orders. Tolerated infusion without adverse affects. Vital signs stable. No complaints at this time. Discharged from clinic ambulatory in stable condition. Alert and oriented x 3. F/U with [facility] as scheduled.

## 2024-05-16 NOTE — Progress Notes (Signed)
 [facility] [address], [place] [address]    Clinic Day:  05/17/2024  Referring physician: Towanda Fret, MD  Patient Care Team: Towanda Fret, MD as PCP - General   ASSESSMENT & PLAN:   Assessment: 1.  T1 cN0 G3 ER/PR/HER2-left breast IDC: - Screening mammogram on 10/20/2022: Abnormal - Left breast diagnostic mammogram/ultrasound (10/26/2023): Linear oriented pleomorphic calcifications in the lower inner left breast span 5.9 cm.  There has been interval development of irregular mass associated with these calcifications in the lower inner left breast.  Ultrasound of the left breast shows irregular hypoechoic mass in the left breast at 9 o'clock position measuring 2 x 1.3 x 1.6 cm.  No abnormal lymph nodes in the left axilla. - Left breast 9:00 biopsy (11/02/2023): Poorly differentiated IDC, grade 3, ER/PR negative, HER2 (1+) by IHC.  Ki-67: 20%. - Left breast biopsy LIQ posterior extent and anterior extent (11/17/2023): DCIS, comedo, high nuclear grade. - PET scan (11/25/2023): No evidence of metastatic disease. - Left mastectomy and SLNB on 12/08/2023 by Dr. Collene Dawson - Pathology: 3.9 x 2.2 x 1.5 cm invasive poorly differentiated ductal adenocarcinoma, grade 3 with extensive necrosis, high-grade DCIS with necrosis, margins free, negative angiolymphatic invasion.  0/6 lymph nodes involved.  pT2 pN0. - Adjuvant chemotherapy with dose dense AC x 4 followed by weekly paclitaxel  x 12 was recommended. - 4 cycles of dose dense AC from 01/19/2024 through 03/01/2024 - Weekly paclitaxel  started on 03/15/2024   2. Social/Family History: -Lives at home with her daughter. Works full-time with mild physical activity at work. No tobacco use.  -Brother died of lung cancer in his early 57's with a history of tobacco use. No other family history of cancer.   3. BRIP1 heterozygosity: - We reviewed genetic testing results which was positive for BRIP1 mutation, putting her at  high risk for ovarian cancer.  Also at high risk for Fanconi anemia. - She had unilateral salpingo-oophorectomy more than 10 years ago.  I have recommended salpingo-oophorectomy of the remaining ovary.   also recommended that her daughters be tested for the same mutation.    Plan: 1.  T1 cN0 G3 triple negative left breast IDC: - She has completed 9 weekly doses of paclitaxel . - She denies any significant GI symptoms. - Labs today: Normal LFTs.  Creatinine stable at 1.01.  White count is low at 2.9, ANC normal.  Platelet count is normal. - She may proceed with week 10 of paclitaxel  at 20% dose reduction.  She will continue 12 weeks of paclitaxel . - I will refer her to radiation oncology. - RTC 4 weeks for follow-up.   2.  Peripheral neuropathy: - She is taking Lyrica  75 mg twice daily.  She has burning sensation in the toes particularly worse at nighttime.  She has developed occasional numbness in the fingertips. - At last visit I started her on amitriptyline  25 mg at bedtime.  She felt sleepy for the next 2 days after taking the first pill.  I have asked her to cut it down to half a pill or one fourth of a pill at bedtime.   3.  High risk drug monitoring: - Last 2D echo on 11/23/2023: LVEF 55%, grade 1 diastolic dysfunction.  No signs of PND or orthopnea.   4.  Hypomagnesemia: - She cannot take magnesium  due to nausea.  Magnesium  is 1.6 today.  She will receive IV magnesium .    No orders of the defined types were  placed in this encounter.     Nadeen Augusta Teague,acting as a Neurosurgeon for [name], MD.,have documented all relevant documentation on the behalf of [name], MD,as directed by  [name], MD while in the presence of [name], MD.  I, [name] MD, have reviewed the above documentation for accuracy and completeness, and I agree with the above.      [name], MD   6/18/20251:01 PM  CHIEF COMPLAINT:    Diagnosis: triple negative left breast cancer   Cancer Staging  Breast cancer of upper-inner quadrant of left female breast [facility]) Staging form: Breast, AJCC 8th Edition - Clinical stage from 11/15/2023: Stage IIB (cT2, cN0(sn), cM0, G3, ER-, PR-, HER2-) - Signed by [name], MD on 01/05/2024    Prior Therapy: 1. Left mastectomy and SLNB, 12/08/23 2. Dose dense AC, 01/1924 - 03/01/24  Current Therapy:  weekly paclitaxel    HISTORY OF PRESENT ILLNESS:   Oncology History  Breast cancer of upper-inner quadrant of left female breast (HCC)  11/15/2023 Initial Diagnosis   Breast cancer of upper-inner quadrant of left female breast (HCC)    Genetic Testing   Likely pathogenic variant in BRIP1 called c.3196del (p.Ser1066Hisfs*12) identified on the [facility] Common Hereditary Cancers+RNA panel. VUS in CTNNA1 called c.1070G>A (p.Arg357His) identified. The report date is 11/29/2023.  The Common Hereditary Cancers Panel + RNA offered by [facility] includes sequencing and/or deletion duplication testing of the following 48 genes: APC*, ATM*, AXIN2, BAP1, BARD1, BMPR1A, BRCA1, BRCA2, BRIP1, CDH1, CDK4, CDKN2A (p14ARF), CDKN2A (p16INK4a), CHEK2, CTNNA1, DICER1*, EPCAM*, FH*, GREM1*, HOXB13, KIT, MBD4, MEN1*, MLH1*, MSH2*, MSH3*, MSH6*, MUTYH, NF1*, NTHL1, PALB2, PDGFRA, PMS2*, POLD1*, POLE, PTEN*, RAD51C, RAD51D, SDHA*, SDHB, SDHC*, SDHD, SMAD4, SMARCA4, STK11, TP53, TSC1*, TSC2, VHL.    11/15/2023 Cancer Staging   Staging form: Breast, AJCC 8th Edition - Clinical stage from 11/15/2023: Stage IIB (cT2, cN0(sn), cM0, G3, ER-, PR-, HER2-) - Signed by [name], MD on 01/05/2024 Histopathologic type: Infiltrating duct carcinoma, NOS Stage prefix: Initial diagnosis Method of lymph node assessment: Sentinel lymph node biopsy Nuclear grade: G3 Histologic grading system: 3 grade system   01/19/2024 -  Chemotherapy   Patient is on Treatment Plan : BREAST DOSE DENSE AC q14d / PACLitaxel  q7d         INTERVAL HISTORY:   Meredith Sanchez is a 58 y.o. female presenting to clinic today for follow up of triple negative left breast cancer. She was last seen by me on 04/26/24.  Today, she states that she is doing well overall. Her appetite level is at 75%. Her energy level is at 50%.  PAST MEDICAL HISTORY:   Past Medical History: Past Medical History:  Diagnosis Date   Allergic rhinitis    Family history of breast cancer    Hyperlipidemia    Hypertension    Myocardial infarction [facility])    Obesity     Surgical History: Past Surgical History:  Procedure Laterality Date   ABDOMINAL HYSTERECTOMY     APPENDECTOMY  11/30/2001   BREAST BIOPSY Left 11/02/2023   path pending   BREAST BIOPSY Left 11/02/2023   US  LT BREAST BX W LOC DEV 1ST LESION IMG BX SPEC US  GUIDE 11/02/2023 [facility]ULTRASOUND   BREAST BIOPSY Left 11/17/2023   MM LT BREAST BX W LOC DEV 1ST LESION IMAGE BX SPEC STEREO GUIDE 11/17/2023 [facility] MAMMOGRAPHY   BREAST BIOPSY Left 11/17/2023   MM LT BREAST BX W LOC DEV EA AD LESION IMG BX SPEC STEREO GUIDE 11/17/2023 [facility] MAMMOGRAPHY  COLONOSCOPY N/A 08/20/2014   Procedure: COLONOSCOPY;  Surgeon: Alyce Jubilee, MD;  Location: [facility] ENDO SUITE;  Service: Endoscopy;  Laterality: N/A;  2:15pm   LEFT OOPHORECTOMY  11/30/2001   PORTACATH PLACEMENT Right 12/08/2023   Procedure: INSERTION SUBCLAVIAN PORT-A-CATH;  Surgeon: Awilda Bogus, MD;  Location: [facility] ORS;  Service: General;  Laterality: Right;   SIMPLE MASTECTOMY WITH AXILLARY SENTINEL NODE BIOPSY Left 12/08/2023   Procedure: SIMPLE MASTECTOMY WITH AXILLARY SENTINEL NODE BIOPSY;  Surgeon: Awilda Bogus, MD;  Location: [facility] ORS;  Service: General;  Laterality: Left;   TUBAL LIGATION     VESICOVAGINAL FISTULA CLOSURE W/ TAH  11/30/2001    Social History: Social History   Socioeconomic History   Marital status: Married    Spouse name: Not on file   Number of children: 3   Years of education: Not on file   Highest education  level: Not on file  Occupational History   Occupation: employed     Comment: [employer]  Tobacco Use   Smoking status: Never   Smokeless tobacco: Never  Vaping Use   Vaping status: Never Used  Substance and Sexual Activity   Alcohol use: No   Drug use: No   Sexual activity: Not on file  Other Topics Concern   Not on file  Social History Narrative   Not on file   Social Drivers of Health   Financial Resource Strain: High Risk (01/19/2024)   Overall Financial Resource Strain (CARDIA)    Difficulty of Paying Living Expenses: Very hard  Food Insecurity: Patient Declined (12/08/2023)   Hunger Vital Sign    Worried About Running Out of Food in the Last Year: Patient declined    Ran Out of Food in the Last Year: Patient declined  Transportation Needs: Patient Declined (12/08/2023)   PRAPARE - Administrator, Civil Service (Medical): Patient declined    Lack of Transportation (Non-Medical): Patient declined  Physical Activity: Inactive (12/08/2018)   Received from [facility]   Exercise Vital Sign    Days of Exercise per Week: 0 days    Minutes of Exercise per Session: 0 min  Stress: Not on file  Social Connections: Not on file  Intimate Partner Violence: Patient Declined (12/08/2023)   Humiliation, Afraid, Rape, and Kick questionnaire    Fear of Current or Ex-Partner: Patient declined    Emotionally Abused: Patient declined    Physically Abused: Patient declined    Sexually Abused: Patient declined    Family History: Family History  Problem Relation Age of Onset   Diabetes Mother    Hypertension Mother    Hypertension Father    Lung cancer Brother 19       metastatic lung cancer to brain   Breast cancer Cousin 57       pat first cousin   Colon cancer Neg Hx     Current Medications:  Current Outpatient Medications:    amitriptyline  (ELAVIL ) 25 MG tablet, Take 1 tablet (25 mg total) by mouth at bedtime., Disp: 30 tablet, Rfl: 1   amlodipine -olmesartan  (AZOR )  10-20 MG tablet, TAKE 1 TABLET BY MOUTH DAILY, Disp: 90 tablet, Rfl: 3   APPLE CIDER VINEGAR PO, Take 1 capsule by mouth daily., Disp: , Rfl:    Ascorbic Acid (VITAMIN C PO), Take 1 capsule by mouth daily., Disp: , Rfl:    ASHWAGANDHA PO, Take 2 tablets by mouth daily., Disp: , Rfl:    ASPIRIN  81 PO, Take 1 tablet  by mouth daily., Disp: , Rfl:    atorvastatin  (LIPITOR) 40 MG tablet, TAKE 1 TABLET(40 MG) BY MOUTH DAILY, Disp: 90 tablet, Rfl: 3   docusate sodium  (COLACE) 100 MG capsule, Take 1 capsule (100 mg total) by mouth 2 (two) times daily as needed for mild constipation., Disp: 30 capsule, Rfl: 0   famotidine  (PEPCID ) 40 MG tablet, Take 1 tablet (40 mg total) by mouth daily., Disp: 30 tablet, Rfl: 5   furosemide  (LASIX ) 20 MG tablet, Take 1 tablet (20 mg total) by mouth daily as needed for edema., Disp: 30 tablet, Rfl: 2   lidocaine -prilocaine  (EMLA ) cream, Apply to affected area once, Disp: 30 g, Rfl: 3   loratadine  (CLARITIN ) 10 MG tablet, Take 1 tablet (10 mg total) by mouth daily., Disp: 30 tablet, Rfl: 5   magnesium  oxide (MAG-OX) 400 (240 Mg) MG tablet, Take 1 tablet (400 mg total) by mouth daily., Disp: 30 tablet, Rfl: 3   Multiple Vitamin (MULTIVITAMIN) tablet, Take 1 tablet by mouth daily., Disp: , Rfl:    ondansetron  (ZOFRAN -ODT) 4 MG disintegrating tablet, Take 1 tablet (4 mg total) by mouth every 6 (six) hours as needed for nausea., Disp: 20 tablet, Rfl: 0   PACLitaxel  (TAXOL  IV), Inject into the vein., Disp: , Rfl:    Pegfilgrastim  (NEULASTA  [place]), Inject into the skin., Disp: , Rfl:    pregabalin  (LYRICA ) 75 MG capsule, Take 1 capsule (75 mg total) by mouth 3 (three) times daily., Disp: 90 capsule, Rfl: 3   prochlorperazine  (COMPAZINE ) 10 MG tablet, Take 1 tablet (10 mg total) by mouth every 6 (six) hours as needed for nausea or vomiting., Disp: 60 tablet, Rfl: 3   triamterene -hydrochlorothiazide  (MAXZIDE -25) 37.5-25 MG tablet, TAKE 1 TABLET BY MOUTH DAILY, Disp: 90 tablet, Rfl:  3 No current facility-administered medications for this visit.  Facility-Administered Medications Ordered in Other Visits:    0.9 %  sodium chloride  infusion, , Intravenous, Continuous, [name], MD, Stopped at 05/17/24 1153   sodium chloride  flush (NS) 0.9 % injection 10 mL, 10 mL, Intracatheter, PRN, Athelene Hursey, MD, 10 mL at 05/17/24 1152   Allergies: Allergies  Allergen Reactions   Ace Inhibitors Shortness Of Breath    Roof of mouth  Tingling, throat closing   Penicillins     Yeast infections    Poison Ivy Extract Rash    REVIEW OF SYSTEMS:   Review of Systems  Constitutional:  Negative for chills, fatigue and fever.  HENT:   Negative for lump/mass, mouth sores, nosebleeds, sore throat and trouble swallowing.   Eyes:  Negative for eye problems.  Respiratory:  Negative for cough and shortness of breath.   Cardiovascular:  Negative for chest pain, leg swelling and palpitations.  Gastrointestinal:  Positive for nausea. Negative for abdominal pain, constipation, diarrhea and vomiting.  Genitourinary:  Negative for bladder incontinence, difficulty urinating, dysuria, frequency, hematuria and nocturia.   Musculoskeletal:  Negative for arthralgias, back pain, flank pain, myalgias and neck pain.  Skin:  Negative for itching and rash.  Neurological:  Positive for dizziness and headaches. Negative for numbness.  Hematological:  Does not bruise/bleed easily.  Psychiatric/Behavioral:  Negative for depression, sleep disturbance and suicidal ideas. The patient is not nervous/anxious.   All other systems reviewed and are negative.    VITALS:   Temperature 99.4 F (37.4 C), temperature source Tympanic.  Wt Readings from Last 3 Encounters:  05/17/24 188 lb 4.4 oz (85.4 kg)  05/10/24 184 lb 4.9 oz (83.6 kg)  05/03/24  184 lb 15.5 oz (83.9 kg)    There is no height or weight on file to calculate BMI.  Performance status (ECOG): 1 - Symptomatic but completely  ambulatory  PHYSICAL EXAM:   Physical Exam Vitals and nursing note reviewed. Exam conducted with a chaperone present.  Constitutional:      Appearance: Normal appearance.   Cardiovascular:     Rate and Rhythm: Normal rate and regular rhythm.     Pulses: Normal pulses.     Heart sounds: Normal heart sounds.  Pulmonary:     Effort: Pulmonary effort is normal.     Breath sounds: Normal breath sounds.  Abdominal:     Palpations: Abdomen is soft. There is no hepatomegaly, splenomegaly or mass.     Tenderness: There is no abdominal tenderness.   Musculoskeletal:     Right lower leg: No edema.     Left lower leg: No edema.  Lymphadenopathy:     Cervical: No cervical adenopathy.     Right cervical: No superficial, deep or posterior cervical adenopathy.    Left cervical: No superficial, deep or posterior cervical adenopathy.     Upper Body:     Right upper body: No supraclavicular or axillary adenopathy.     Left upper body: No supraclavicular or axillary adenopathy.   Neurological:     General: No focal deficit present.     Mental Status: She is alert and oriented to person, place, and time.   Psychiatric:        Mood and Affect: Mood normal.        Behavior: Behavior normal.     LABS:   CBC     Component Value Date/Time   WBC 2.9 (L) 05/17/2024 0844   RBC 2.97 (L) 05/17/2024 0844   HGB 10.7 (L) 05/17/2024 0844   HGB 12.7 04/09/2023 1417   HCT 31.4 (L) 05/17/2024 0844   HCT 36.4 04/09/2023 1417   PLT 301 05/17/2024 0844   PLT 359 04/09/2023 1417   MCV 105.7 (H) 05/17/2024 0844   MCV 91 04/09/2023 1417   MCH 36.0 (H) 05/17/2024 0844   MCHC 34.1 05/17/2024 0844   RDW 13.6 05/17/2024 0844   RDW 13.7 04/09/2023 1417   LYMPHSABS 0.5 (L) 05/17/2024 0844   MONOABS 0.3 05/17/2024 0844   EOSABS 0.1 05/17/2024 0844   BASOSABS 0.0 05/17/2024 0844    CMP      Component Value Date/Time   NA 140 05/17/2024 0844   NA 140 09/10/2023 1343   K 3.3 (L) 05/17/2024 0844    CL 106 05/17/2024 0844   CO2 22 05/17/2024 0844   GLUCOSE 211 (H) 05/17/2024 0844   BUN 18 05/17/2024 0844   BUN 27 (H) 09/10/2023 1343   CREATININE 1.01 (H) 05/17/2024 0844   CREATININE 0.81 01/10/2020 0908   CALCIUM  9.3 05/17/2024 0844   PROT 6.8 05/17/2024 0844   PROT 7.2 09/10/2023 1343   ALBUMIN 3.9 05/17/2024 0844   ALBUMIN 4.5 09/10/2023 1343   AST 29 05/17/2024 0844   ALT 36 05/17/2024 0844   ALKPHOS 49 05/17/2024 0844   BILITOT 0.7 05/17/2024 0844   BILITOT 0.5 09/10/2023 1343   GFRNONAA >60 05/17/2024 0844   GFRNONAA 82 01/10/2020 0908   GFRAA 95 01/10/2020 0908     No results found for: CEA1, CEA / No results found for: CEA1, CEA No results found for: PSA1 No results found for: EXB284 No results found for: CAN125  No results found for: TOTALPROTELP, ALBUMINELP,  A1GS, A2GS, BETS, BETA2SER, GAMS, MSPIKE, SPEI Lab Results  Component Value Date   TIBC 341 03/29/2024   TIBC 344 08/17/2014   FERRITIN 331 (H) 03/29/2024   FERRITIN 348 (H) 08/17/2014   FERRITIN 134 07/09/2014   IRONPCTSAT 17 03/29/2024   IRONPCTSAT 28 08/17/2014   No results found for: LDH   STUDIES:   No results found.

## 2024-05-17 ENCOUNTER — Inpatient Hospital Stay

## 2024-05-17 ENCOUNTER — Inpatient Hospital Stay (HOSPITAL_BASED_OUTPATIENT_CLINIC_OR_DEPARTMENT_OTHER): Admitting: Hematology

## 2024-05-17 VITALS — BP 99/67 | HR 92 | Temp 97.9°F | Resp 18

## 2024-05-17 VITALS — Temp 99.4°F

## 2024-05-17 DIAGNOSIS — R739 Hyperglycemia, unspecified: Secondary | ICD-10-CM

## 2024-05-17 DIAGNOSIS — Z5111 Encounter for antineoplastic chemotherapy: Secondary | ICD-10-CM | POA: Diagnosis not present

## 2024-05-17 DIAGNOSIS — R790 Abnormal level of blood mineral: Secondary | ICD-10-CM

## 2024-05-17 DIAGNOSIS — Z171 Estrogen receptor negative status [ER-]: Secondary | ICD-10-CM

## 2024-05-17 DIAGNOSIS — E876 Hypokalemia: Secondary | ICD-10-CM

## 2024-05-17 DIAGNOSIS — C50212 Malignant neoplasm of upper-inner quadrant of left female breast: Secondary | ICD-10-CM

## 2024-05-17 LAB — CBC WITH DIFFERENTIAL/PLATELET
Abs Immature Granulocytes: 0 10*3/uL (ref 0.00–0.07)
Basophils Absolute: 0 10*3/uL (ref 0.0–0.1)
Basophils Relative: 1 %
Eosinophils Absolute: 0.1 10*3/uL (ref 0.0–0.5)
Eosinophils Relative: 2 %
HCT: 31.4 % — ABNORMAL LOW (ref 36.0–46.0)
Hemoglobin: 10.7 g/dL — ABNORMAL LOW (ref 12.0–15.0)
Immature Granulocytes: 0 %
Lymphocytes Relative: 18 %
Lymphs Abs: 0.5 10*3/uL — ABNORMAL LOW (ref 0.7–4.0)
MCH: 36 pg — ABNORMAL HIGH (ref 26.0–34.0)
MCHC: 34.1 g/dL (ref 30.0–36.0)
MCV: 105.7 fL — ABNORMAL HIGH (ref 80.0–100.0)
Monocytes Absolute: 0.3 10*3/uL (ref 0.1–1.0)
Monocytes Relative: 11 %
Neutro Abs: 2 10*3/uL (ref 1.7–7.7)
Neutrophils Relative %: 68 %
Platelets: 301 10*3/uL (ref 150–400)
RBC: 2.97 MIL/uL — ABNORMAL LOW (ref 3.87–5.11)
RDW: 13.6 % (ref 11.5–15.5)
WBC: 2.9 10*3/uL — ABNORMAL LOW (ref 4.0–10.5)
nRBC: 0 % (ref 0.0–0.2)

## 2024-05-17 LAB — COMPREHENSIVE METABOLIC PANEL WITH GFR
ALT: 36 U/L (ref 0–44)
AST: 29 U/L (ref 15–41)
Albumin: 3.9 g/dL (ref 3.5–5.0)
Alkaline Phosphatase: 49 U/L (ref 38–126)
Anion gap: 12 (ref 5–15)
BUN: 18 mg/dL (ref 6–20)
CO2: 22 mmol/L (ref 22–32)
Calcium: 9.3 mg/dL (ref 8.9–10.3)
Chloride: 106 mmol/L (ref 98–111)
Creatinine, Ser: 1.01 mg/dL — ABNORMAL HIGH (ref 0.44–1.00)
GFR, Estimated: >60 mL/min (ref >60)
Glucose, Bld: 211 mg/dL — ABNORMAL HIGH (ref 70–99)
Potassium: 3.3 mmol/L — ABNORMAL LOW (ref 3.5–5.1)
Sodium: 140 mmol/L (ref 135–145)
Total Bilirubin: 0.7 mg/dL (ref 0.0–1.2)
Total Protein: 6.8 g/dL (ref 6.5–8.1)

## 2024-05-17 LAB — MAGNESIUM: Magnesium: 1.6 mg/dL — ABNORMAL LOW (ref 1.7–2.4)

## 2024-05-17 MED ORDER — SODIUM CHLORIDE 0.9 % IV SOLN: INTRAVENOUS | Status: DC

## 2024-05-17 MED ORDER — FAMOTIDINE IN NACL 20-0.9 MG/50ML-% IV SOLN
20.0000 mg | Freq: Once | INTRAVENOUS | Status: AC
  Administered 2024-05-17: 20 mg via INTRAVENOUS
  Filled 2024-05-17: qty 50

## 2024-05-17 MED ORDER — SODIUM CHLORIDE 0.9% FLUSH
10.0000 mL | Freq: Once | INTRAVENOUS | Status: AC
  Administered 2024-05-17: 10 mL via INTRAVENOUS

## 2024-05-17 MED ORDER — SODIUM CHLORIDE 0.9% FLUSH
10.0000 mL | INTRAVENOUS | Status: DC | PRN
  Administered 2024-05-17: 10 mL

## 2024-05-17 MED ORDER — POTASSIUM CHLORIDE CRYS ER 20 MEQ PO TBCR
40.0000 meq | EXTENDED_RELEASE_TABLET | Freq: Once | ORAL | Status: AC
  Administered 2024-05-17: 40 meq via ORAL
  Filled 2024-05-17: qty 2

## 2024-05-17 MED ORDER — HEPARIN SOD (PORK) LOCK FLUSH 100 UNIT/ML IV SOLN
500.0000 [IU] | Freq: Once | INTRAVENOUS | Status: AC | PRN
  Administered 2024-05-17: 500 [IU]

## 2024-05-17 MED ORDER — CETIRIZINE HCL 10 MG/ML IV SOLN
10.0000 mg | Freq: Once | INTRAVENOUS | Status: AC
  Administered 2024-05-17: 10 mg via INTRAVENOUS
  Filled 2024-05-17: qty 1

## 2024-05-17 MED ORDER — SODIUM CHLORIDE 0.9 % IV SOLN
64.0000 mg/m2 | Freq: Once | INTRAVENOUS | Status: AC
  Administered 2024-05-17: 126 mg via INTRAVENOUS
  Filled 2024-05-17: qty 21

## 2024-05-17 MED ORDER — INSULIN ASPART 100 UNIT/ML IJ SOLN
10.0000 [IU] | Freq: Once | INTRAMUSCULAR | Status: AC
  Administered 2024-05-17: 10 [IU] via SUBCUTANEOUS
  Filled 2024-05-17: qty 1

## 2024-05-17 MED ORDER — MAGNESIUM SULFATE 2 GM/50ML IV SOLN
2.0000 g | Freq: Once | INTRAVENOUS | Status: AC
  Administered 2024-05-17: 2 g via INTRAVENOUS
  Filled 2024-05-17: qty 50

## 2024-05-17 MED ORDER — ONDANSETRON HCL 4 MG/2ML IJ SOLN
8.0000 mg | Freq: Once | INTRAMUSCULAR | Status: AC
  Administered 2024-05-17: 8 mg via INTRAVENOUS
  Filled 2024-05-17: qty 4

## 2024-05-17 MED ORDER — DEXAMETHASONE SODIUM PHOSPHATE 10 MG/ML IJ SOLN
10.0000 mg | Freq: Once | INTRAMUSCULAR | Status: AC
  Administered 2024-05-17: 10 mg via INTRAVENOUS
  Filled 2024-05-17: qty 1

## 2024-05-17 NOTE — Patient Instructions (Signed)

## 2024-05-17 NOTE — Progress Notes (Signed)
 Patient has been examined by Dr. Ellin Saba. Vital signs and labs have been reviewed by MD - ANC, Creatinine, LFTs, hemoglobin, and platelets are within treatment parameters per M.D. - pt may proceed with treatment.  Primary RN and pharmacy notified.

## 2024-05-17 NOTE — Patient Instructions (Signed)
 [facility] [place] - A DEPT OF [facility]  Discharge Instructions: Thank you for choosing [facility] to provide your oncology and hematology care.  If you have a lab appointment with the Cancer Center - please note that after April 8th, 2024, all labs will be drawn in the cancer center.  You do not have to check in or register with the main entrance as you have in the past but will complete your check-in in the cancer center.  Wear comfortable clothing and clothing appropriate for easy access to any Portacath or PICC line.   We strive to give you quality time with your provider. You may need to reschedule your appointment if you arrive late (15 or more minutes).  Arriving late affects you and other patients whose appointments are after yours.  Also, if you miss three or more appointments without notifying the office, you may be dismissed from the clinic at the provider's discretion.      For prescription refill requests, have your pharmacy contact our office and allow 72 hours for refills to be completed.    Today you received the following chemotherapy and/or immunotherapy agents taxol      To help prevent nausea and vomiting after your treatment, we encourage you to take your nausea medication as directed.  BELOW ARE SYMPTOMS THAT SHOULD BE REPORTED IMMEDIATELY: *FEVER GREATER THAN 100.4 F (38 C) OR HIGHER *CHILLS OR SWEATING *NAUSEA AND VOMITING THAT IS NOT CONTROLLED WITH YOUR NAUSEA MEDICATION *UNUSUAL SHORTNESS OF BREATH *UNUSUAL BRUISING OR BLEEDING *URINARY PROBLEMS (pain or burning when urinating, or frequent urination) *BOWEL PROBLEMS (unusual diarrhea, constipation, pain near the anus) TENDERNESS IN MOUTH AND THROAT WITH OR WITHOUT PRESENCE OF ULCERS (sore throat, sores in mouth, or a toothache) UNUSUAL RASH, SWELLING OR PAIN  UNUSUAL VAGINAL DISCHARGE OR ITCHING   Items with * indicate a potential emergency and should be followed up as  soon as possible or go to the Emergency Department if any problems should occur.  Please show the CHEMOTHERAPY ALERT CARD or IMMUNOTHERAPY ALERT CARD at check-in to the Emergency Department and triage nurse.  Should you have questions after your visit or need to cancel or reschedule your appointment, please contact [facility] [place] - A DEPT OF Eligha Brideg[facility] [phone]  and follow the prompts.  Office hours are 8:00 a.m. to 4:30 p.m. Monday - Friday. Please note that voicemails left after 4:00 p.m. may not be returned until the following business day.  We are closed weekends and major holidays. You have access to a nurse at all times for urgent questions. Please call the main number to the clinic [phone] and follow the prompts.  For any non-urgent questions, you may also contact your provider using MyChart. We now offer e-Visits for anyone 18 and older to request care online for non-urgent symptoms. For details visit [email or web address].   Also download the MyChart app! Go to the app store, search "MyChart", open the app, select [place], and log in with your MyChart username and password.

## 2024-05-17 NOTE — Progress Notes (Signed)

## 2024-05-24 ENCOUNTER — Inpatient Hospital Stay

## 2024-05-24 VITALS — BP 113/62 | HR 90 | Temp 97.3°F | Resp 18 | Wt 185.2 lb

## 2024-05-24 DIAGNOSIS — Z5111 Encounter for antineoplastic chemotherapy: Secondary | ICD-10-CM | POA: Diagnosis not present

## 2024-05-24 DIAGNOSIS — Z171 Estrogen receptor negative status [ER-]: Secondary | ICD-10-CM

## 2024-05-24 LAB — CBC WITH DIFFERENTIAL/PLATELET
Abs Immature Granulocytes: 0 10*3/uL (ref 0.00–0.07)
Basophils Absolute: 0 10*3/uL (ref 0.0–0.1)
Basophils Relative: 1 %
Eosinophils Absolute: 0.1 10*3/uL (ref 0.0–0.5)
Eosinophils Relative: 2 %
HCT: 32.7 % — ABNORMAL LOW (ref 36.0–46.0)
Hemoglobin: 11 g/dL — ABNORMAL LOW (ref 12.0–15.0)
Immature Granulocytes: 0 %
Lymphocytes Relative: 17 %
Lymphs Abs: 0.5 10*3/uL — ABNORMAL LOW (ref 0.7–4.0)
MCH: 35.1 pg — ABNORMAL HIGH (ref 26.0–34.0)
MCHC: 33.6 g/dL (ref 30.0–36.0)
MCV: 104.5 fL — ABNORMAL HIGH (ref 80.0–100.0)
Monocytes Absolute: 0.3 10*3/uL (ref 0.1–1.0)
Monocytes Relative: 11 %
Neutro Abs: 2 10*3/uL (ref 1.7–7.7)
Neutrophils Relative %: 69 %
Platelets: 317 10*3/uL (ref 150–400)
RBC: 3.13 MIL/uL — ABNORMAL LOW (ref 3.87–5.11)
RDW: 13.2 % (ref 11.5–15.5)
WBC: 2.8 10*3/uL — ABNORMAL LOW (ref 4.0–10.5)
nRBC: 0 % (ref 0.0–0.2)

## 2024-05-24 LAB — COMPREHENSIVE METABOLIC PANEL WITH GFR
ALT: 37 U/L (ref 0–44)
AST: 30 U/L (ref 15–41)
Albumin: 4 g/dL (ref 3.5–5.0)
Alkaline Phosphatase: 47 U/L (ref 38–126)
Anion gap: 12 (ref 5–15)
BUN: 26 mg/dL — ABNORMAL HIGH (ref 6–20)
CO2: 22 mmol/L (ref 22–32)
Calcium: 9.4 mg/dL (ref 8.9–10.3)
Chloride: 107 mmol/L (ref 98–111)
Creatinine, Ser: 1.16 mg/dL — ABNORMAL HIGH (ref 0.44–1.00)
GFR, Estimated: 54 mL/min — ABNORMAL LOW (ref >60)
Glucose, Bld: 200 mg/dL — ABNORMAL HIGH (ref 70–99)
Potassium: 3.8 mmol/L (ref 3.5–5.1)
Sodium: 141 mmol/L (ref 135–145)
Total Bilirubin: 0.9 mg/dL (ref 0.0–1.2)
Total Protein: 6.9 g/dL (ref 6.5–8.1)

## 2024-05-24 LAB — MAGNESIUM: Magnesium: 1.7 mg/dL (ref 1.7–2.4)

## 2024-05-24 MED ORDER — SODIUM CHLORIDE 0.9% FLUSH
10.0000 mL | INTRAVENOUS | Status: DC | PRN
  Administered 2024-05-24: 10 mL via INTRAVENOUS

## 2024-05-24 MED ORDER — SODIUM CHLORIDE 0.9% FLUSH
10.0000 mL | INTRAVENOUS | Status: DC | PRN
  Administered 2024-05-24: 10 mL

## 2024-05-24 MED ORDER — HEPARIN SOD (PORK) LOCK FLUSH 100 UNIT/ML IV SOLN
500.0000 [IU] | Freq: Once | INTRAVENOUS | Status: AC | PRN
  Administered 2024-05-24: 500 [IU]

## 2024-05-24 MED ORDER — DEXAMETHASONE SODIUM PHOSPHATE 10 MG/ML IJ SOLN
10.0000 mg | Freq: Once | INTRAMUSCULAR | Status: AC
  Administered 2024-05-24: 10 mg via INTRAVENOUS
  Filled 2024-05-24: qty 1

## 2024-05-24 MED ORDER — FAMOTIDINE IN NACL 20-0.9 MG/50ML-% IV SOLN
20.0000 mg | Freq: Once | INTRAVENOUS | Status: AC
  Administered 2024-05-24: 20 mg via INTRAVENOUS
  Filled 2024-05-24: qty 50

## 2024-05-24 MED ORDER — ONDANSETRON HCL 4 MG/2ML IJ SOLN
8.0000 mg | Freq: Once | INTRAMUSCULAR | Status: AC
  Administered 2024-05-24: 8 mg via INTRAVENOUS
  Filled 2024-05-24: qty 4

## 2024-05-24 MED ORDER — SODIUM CHLORIDE 0.9 % IV SOLN: INTRAVENOUS | Status: DC

## 2024-05-24 MED ORDER — SODIUM CHLORIDE 0.9 % IV SOLN
64.0000 mg/m2 | Freq: Once | INTRAVENOUS | Status: AC
  Administered 2024-05-24: 126 mg via INTRAVENOUS
  Filled 2024-05-24: qty 21

## 2024-05-24 MED ORDER — CETIRIZINE HCL 10 MG/ML IV SOLN
10.0000 mg | Freq: Once | INTRAVENOUS | Status: AC
  Administered 2024-05-24: 10 mg via INTRAVENOUS
  Filled 2024-05-24: qty 1

## 2024-05-24 NOTE — Progress Notes (Signed)
Patient presents today for chemotherapy infusion.  Patient is in satisfactory condition with no new complaints voiced.  Vital signs are stable.  Labs reviewed and all labs are within treatment parameters.  We will proceed with treatment per MD orders.

## 2024-05-24 NOTE — Patient Instructions (Signed)
 [facility] [place] - A DEPT OF [facility]  Discharge Instructions: Thank you for choosing [facility] to provide your oncology and hematology care.  If you have a lab appointment with the Cancer Center - please note that after April 8th, 2024, all labs will be drawn in the cancer center.  You do not have to check in or register with the main entrance as you have in the past but will complete your check-in in the cancer center.  Wear comfortable clothing and clothing appropriate for easy access to any Portacath or PICC line.   We strive to give you quality time with your provider. You may need to reschedule your appointment if you arrive late (15 or more minutes).  Arriving late affects you and other patients whose appointments are after yours.  Also, if you miss three or more appointments without notifying the office, you may be dismissed from the clinic at the provider's discretion.      For prescription refill requests, have your pharmacy contact our office and allow 72 hours for refills to be completed.    Today you received the following chemotherapy and/or immunotherapy agents taxol      To help prevent nausea and vomiting after your treatment, we encourage you to take your nausea medication as directed.  BELOW ARE SYMPTOMS THAT SHOULD BE REPORTED IMMEDIATELY: *FEVER GREATER THAN 100.4 F (38 C) OR HIGHER *CHILLS OR SWEATING *NAUSEA AND VOMITING THAT IS NOT CONTROLLED WITH YOUR NAUSEA MEDICATION *UNUSUAL SHORTNESS OF BREATH *UNUSUAL BRUISING OR BLEEDING *URINARY PROBLEMS (pain or burning when urinating, or frequent urination) *BOWEL PROBLEMS (unusual diarrhea, constipation, pain near the anus) TENDERNESS IN MOUTH AND THROAT WITH OR WITHOUT PRESENCE OF ULCERS (sore throat, sores in mouth, or a toothache) UNUSUAL RASH, SWELLING OR PAIN  UNUSUAL VAGINAL DISCHARGE OR ITCHING   Items with * indicate a potential emergency and should be followed up as  soon as possible or go to the Emergency Department if any problems should occur.  Please show the CHEMOTHERAPY ALERT CARD or IMMUNOTHERAPY ALERT CARD at check-in to the Emergency Department and triage nurse.  Should you have questions after your visit or need to cancel or reschedule your appointment, please contact [facility] [place] - A DEPT OF Eligha Brideg[facility] [phone]  and follow the prompts.  Office hours are 8:00 a.m. to 4:30 p.m. Monday - Friday. Please note that voicemails left after 4:00 p.m. may not be returned until the following business day.  We are closed weekends and major holidays. You have access to a nurse at all times for urgent questions. Please call the main number to the clinic [phone] and follow the prompts.  For any non-urgent questions, you may also contact your provider using MyChart. We now offer e-Visits for anyone 18 and older to request care online for non-urgent symptoms. For details visit [email or web address].   Also download the MyChart app! Go to the app store, search "MyChart", open the app, select [place], and log in with your MyChart username and password.

## 2024-05-24 NOTE — Progress Notes (Signed)

## 2024-05-31 ENCOUNTER — Inpatient Hospital Stay

## 2024-05-31 ENCOUNTER — Inpatient Hospital Stay: Attending: Hematology

## 2024-05-31 ENCOUNTER — Inpatient Hospital Stay: Admitting: Hematology

## 2024-05-31 VITALS — BP 127/73 | HR 47 | Temp 97.9°F | Resp 16 | Wt 183.9 lb

## 2024-05-31 VITALS — BP 117/84 | HR 80

## 2024-05-31 DIAGNOSIS — Z171 Estrogen receptor negative status [ER-]: Secondary | ICD-10-CM | POA: Diagnosis not present

## 2024-05-31 DIAGNOSIS — G629 Polyneuropathy, unspecified: Secondary | ICD-10-CM | POA: Diagnosis not present

## 2024-05-31 DIAGNOSIS — Z452 Encounter for adjustment and management of vascular access device: Secondary | ICD-10-CM | POA: Insufficient documentation

## 2024-05-31 DIAGNOSIS — C50212 Malignant neoplasm of upper-inner quadrant of left female breast: Secondary | ICD-10-CM

## 2024-05-31 DIAGNOSIS — C50812 Malignant neoplasm of overlapping sites of left female breast: Secondary | ICD-10-CM | POA: Diagnosis present

## 2024-05-31 DIAGNOSIS — Z95828 Presence of other vascular implants and grafts: Secondary | ICD-10-CM

## 2024-05-31 DIAGNOSIS — Z5111 Encounter for antineoplastic chemotherapy: Secondary | ICD-10-CM | POA: Diagnosis present

## 2024-05-31 LAB — COMPREHENSIVE METABOLIC PANEL WITH GFR
ALT: 35 U/L (ref 0–44)
AST: 32 U/L (ref 15–41)
Albumin: 4 g/dL (ref 3.5–5.0)
Alkaline Phosphatase: 49 U/L (ref 38–126)
Anion gap: 15 (ref 5–15)
BUN: 19 mg/dL (ref 6–20)
CO2: 20 mmol/L — ABNORMAL LOW (ref 22–32)
Calcium: 9.4 mg/dL (ref 8.9–10.3)
Chloride: 105 mmol/L (ref 98–111)
Creatinine, Ser: 1.03 mg/dL — ABNORMAL HIGH (ref 0.44–1.00)
GFR, Estimated: >60 mL/min (ref >60)
Glucose, Bld: 186 mg/dL — ABNORMAL HIGH (ref 70–99)
Potassium: 3.1 mmol/L — ABNORMAL LOW (ref 3.5–5.1)
Sodium: 140 mmol/L (ref 135–145)
Total Bilirubin: 0.8 mg/dL (ref 0.0–1.2)
Total Protein: 7 g/dL (ref 6.5–8.1)

## 2024-05-31 LAB — CBC WITH DIFFERENTIAL/PLATELET
Abs Immature Granulocytes: 0.01 K/uL (ref 0.00–0.07)
Basophils Absolute: 0 K/uL (ref 0.0–0.1)
Basophils Relative: 1 %
Eosinophils Absolute: 0 K/uL (ref 0.0–0.5)
Eosinophils Relative: 1 %
HCT: 32.8 % — ABNORMAL LOW (ref 36.0–46.0)
Hemoglobin: 11.4 g/dL — ABNORMAL LOW (ref 12.0–15.0)
Immature Granulocytes: 0 %
Lymphocytes Relative: 16 %
Lymphs Abs: 0.5 K/uL — ABNORMAL LOW (ref 0.7–4.0)
MCH: 36.1 pg — ABNORMAL HIGH (ref 26.0–34.0)
MCHC: 34.8 g/dL (ref 30.0–36.0)
MCV: 103.8 fL — ABNORMAL HIGH (ref 80.0–100.0)
Monocytes Absolute: 0.3 K/uL (ref 0.1–1.0)
Monocytes Relative: 11 %
Neutro Abs: 2.2 K/uL (ref 1.7–7.7)
Neutrophils Relative %: 71 %
Platelets: 321 K/uL (ref 150–400)
RBC: 3.16 MIL/uL — ABNORMAL LOW (ref 3.87–5.11)
RDW: 13 % (ref 11.5–15.5)
WBC: 3.1 K/uL — ABNORMAL LOW (ref 4.0–10.5)
nRBC: 0 % (ref 0.0–0.2)

## 2024-05-31 LAB — MAGNESIUM: Magnesium: 1.5 mg/dL — ABNORMAL LOW (ref 1.7–2.4)

## 2024-05-31 MED ORDER — HEPARIN SOD (PORK) LOCK FLUSH 100 UNIT/ML IV SOLN
500.0000 [IU] | Freq: Once | INTRAVENOUS | Status: AC | PRN
  Administered 2024-05-31: 500 [IU]

## 2024-05-31 MED ORDER — MAGNESIUM SULFATE 2 GM/50ML IV SOLN
2.0000 g | Freq: Once | INTRAVENOUS | Status: AC
  Administered 2024-05-31: 2 g via INTRAVENOUS
  Filled 2024-05-31: qty 50

## 2024-05-31 MED ORDER — ONDANSETRON HCL 4 MG/2ML IJ SOLN
8.0000 mg | Freq: Once | INTRAMUSCULAR | Status: AC
  Administered 2024-05-31: 8 mg via INTRAVENOUS
  Filled 2024-05-31: qty 4

## 2024-05-31 MED ORDER — POTASSIUM CHLORIDE CRYS ER 20 MEQ PO TBCR
40.0000 meq | EXTENDED_RELEASE_TABLET | Freq: Once | ORAL | Status: AC
  Administered 2024-05-31: 40 meq via ORAL
  Filled 2024-05-31: qty 2

## 2024-05-31 MED ORDER — SODIUM CHLORIDE 0.9 % IV SOLN
INTRAVENOUS | Status: DC
Start: 2024-05-31 — End: 2024-05-31

## 2024-05-31 MED ORDER — SODIUM CHLORIDE FLUSH 0.9 % IV SOLN
10.0000 mL | Freq: Once | INTRAVENOUS | Status: AC
  Administered 2024-05-31: 10 mL via INTRAVENOUS
  Filled 2024-05-31: qty 10

## 2024-05-31 MED ORDER — FAMOTIDINE IN NACL 20-0.9 MG/50ML-% IV SOLN
20.0000 mg | Freq: Once | INTRAVENOUS | Status: AC
  Administered 2024-05-31: 20 mg via INTRAVENOUS
  Filled 2024-05-31: qty 50

## 2024-05-31 MED ORDER — CETIRIZINE HCL 10 MG/ML IV SOLN
10.0000 mg | Freq: Once | INTRAVENOUS | Status: AC
  Administered 2024-05-31: 10 mg via INTRAVENOUS
  Filled 2024-05-31: qty 1

## 2024-05-31 MED ORDER — SODIUM CHLORIDE 0.9 % IV SOLN
64.0000 mg/m2 | Freq: Once | INTRAVENOUS | Status: AC
  Administered 2024-05-31: 126 mg via INTRAVENOUS
  Filled 2024-05-31: qty 21

## 2024-05-31 MED ORDER — DEXAMETHASONE SODIUM PHOSPHATE 10 MG/ML IJ SOLN
10.0000 mg | Freq: Once | INTRAMUSCULAR | Status: AC
  Administered 2024-05-31: 10 mg via INTRAVENOUS
  Filled 2024-05-31: qty 1

## 2024-05-31 NOTE — Progress Notes (Signed)
 Potassium 3.1 and Magnesium  1.5. Pt will receive potassium 40 mEq PO and Magnesium  2g IV per standing orders. Pt updated and agrees to plan.  Patient tolerated chemotherapy with no complaints voiced.  Side effects with management reviewed with understanding verbalized.  Port site clean and dry with no bruising or swelling noted at site.  Good blood return noted before and after administration of chemotherapy.  Band aid applied.  Patient left in satisfactory condition with VSS and no s/s of distress noted. All follow ups as scheduled.   Sarissa Dern

## 2024-05-31 NOTE — Patient Instructions (Signed)
 [facility] [place] - A DEPT OF [facility]  Discharge Instructions: Thank you for choosing [facility] to provide your oncology and hematology care.  If you have a lab appointment with the Cancer Center - please note that after April 8th, 2024, all labs will be drawn in the cancer center.  You do not have to check in or register with the main entrance as you have in the past but will complete your check-in in the cancer center.  Wear comfortable clothing and clothing appropriate for easy access to any Portacath or PICC line.   We strive to give you quality time with your provider. You may need to reschedule your appointment if you arrive late (15 or more minutes).  Arriving late affects you and other patients whose appointments are after yours.  Also, if you miss three or more appointments without notifying the office, you may be dismissed from the clinic at the provider's discretion.      For prescription refill requests, have your pharmacy contact our office and allow 72 hours for refills to be completed.    Today you received the following chemotherapy and/or immunotherapy agents Taxol   To help prevent nausea and vomiting after your treatment, we encourage you to take your nausea medication as directed.  BELOW ARE SYMPTOMS THAT SHOULD BE REPORTED IMMEDIATELY: *FEVER GREATER THAN 100.4 F (38 C) OR HIGHER *CHILLS OR SWEATING *NAUSEA AND VOMITING THAT IS NOT CONTROLLED WITH YOUR NAUSEA MEDICATION *UNUSUAL SHORTNESS OF BREATH *UNUSUAL BRUISING OR BLEEDING *URINARY PROBLEMS (pain or burning when urinating, or frequent urination) *BOWEL PROBLEMS (unusual diarrhea, constipation, pain near the anus) TENDERNESS IN MOUTH AND THROAT WITH OR WITHOUT PRESENCE OF ULCERS (sore throat, sores in mouth, or a toothache) UNUSUAL RASH, SWELLING OR PAIN  UNUSUAL VAGINAL DISCHARGE OR ITCHING   Items with * indicate a potential emergency and should be followed up as  soon as possible or go to the Emergency Department if any problems should occur.  Please show the CHEMOTHERAPY ALERT CARD or IMMUNOTHERAPY ALERT CARD at check-in to the Emergency Department and triage nurse.  Should you have questions after your visit or need to cancel or reschedule your appointment, please contact [facility] CANCER CTR [place] - A DEPT OF [facility]igh[facility] Bridegroom [facility] [phone]  and follow the prompts.  Office hours are 8:00 a.m. to 4:30 p.m. Monday - Friday. Please note that voicemails left after 4:00 p.m. may not be returned until the following business day.  We are closed weekends and major holidays. You have access to a nurse at all times for urgent questions. Please call the main number to the clinic [phone] and follow the prompts.  For any non-urgent questions, you may also contact your provider using MyChart. We now offer e-Visits for anyone 40 and older to request care online for non-urgent symptoms. For details visit [email or web address].   Also download the MyChart app! Go to the app store, search "MyChart", open the app, select [place], and log in with your MyChart username and password.

## 2024-06-07 ENCOUNTER — Inpatient Hospital Stay

## 2024-06-15 ENCOUNTER — Inpatient Hospital Stay (HOSPITAL_BASED_OUTPATIENT_CLINIC_OR_DEPARTMENT_OTHER): Admitting: Hematology

## 2024-06-15 ENCOUNTER — Inpatient Hospital Stay

## 2024-06-15 DIAGNOSIS — C50212 Malignant neoplasm of upper-inner quadrant of left female breast: Secondary | ICD-10-CM

## 2024-06-15 DIAGNOSIS — Z171 Estrogen receptor negative status [ER-]: Secondary | ICD-10-CM | POA: Diagnosis not present

## 2024-06-15 DIAGNOSIS — Z5111 Encounter for antineoplastic chemotherapy: Secondary | ICD-10-CM | POA: Diagnosis not present

## 2024-06-15 DIAGNOSIS — Z1231 Encounter for screening mammogram for malignant neoplasm of breast: Secondary | ICD-10-CM | POA: Diagnosis not present

## 2024-06-15 LAB — CBC WITH DIFFERENTIAL/PLATELET
Abs Immature Granulocytes: 0.01 K/uL (ref 0.00–0.07)
Basophils Absolute: 0 K/uL (ref 0.0–0.1)
Basophils Relative: 1 %
Eosinophils Absolute: 0 K/uL (ref 0.0–0.5)
Eosinophils Relative: 1 %
HCT: 33.7 % — ABNORMAL LOW (ref 36.0–46.0)
Hemoglobin: 11.3 g/dL — ABNORMAL LOW (ref 12.0–15.0)
Immature Granulocytes: 0 %
Lymphocytes Relative: 13 %
Lymphs Abs: 0.4 K/uL — ABNORMAL LOW (ref 0.7–4.0)
MCH: 34.7 pg — ABNORMAL HIGH (ref 26.0–34.0)
MCHC: 33.5 g/dL (ref 30.0–36.0)
MCV: 103.4 fL — ABNORMAL HIGH (ref 80.0–100.0)
Monocytes Absolute: 0.4 K/uL (ref 0.1–1.0)
Monocytes Relative: 12 %
Neutro Abs: 2.5 K/uL (ref 1.7–7.7)
Neutrophils Relative %: 73 %
Platelets: 338 K/uL (ref 150–400)
RBC: 3.26 MIL/uL — ABNORMAL LOW (ref 3.87–5.11)
RDW: 12.7 % (ref 11.5–15.5)
WBC: 3.4 K/uL — ABNORMAL LOW (ref 4.0–10.5)
nRBC: 0 % (ref 0.0–0.2)

## 2024-06-15 LAB — COMPREHENSIVE METABOLIC PANEL WITH GFR
ALT: 28 U/L (ref 0–44)
AST: 29 U/L (ref 15–41)
Albumin: 3.8 g/dL (ref 3.5–5.0)
Alkaline Phosphatase: 51 U/L (ref 38–126)
Anion gap: 12 (ref 5–15)
BUN: 20 mg/dL (ref 6–20)
CO2: 22 mmol/L (ref 22–32)
Calcium: 9.5 mg/dL (ref 8.9–10.3)
Chloride: 106 mmol/L (ref 98–111)
Creatinine, Ser: 0.95 mg/dL (ref 0.44–1.00)
GFR, Estimated: >60 mL/min (ref >60)
Glucose, Bld: 165 mg/dL — ABNORMAL HIGH (ref 70–99)
Potassium: 3.1 mmol/L — ABNORMAL LOW (ref 3.5–5.1)
Sodium: 140 mmol/L (ref 135–145)
Total Bilirubin: 0.8 mg/dL (ref 0.0–1.2)
Total Protein: 7.2 g/dL (ref 6.5–8.1)

## 2024-06-15 LAB — MAGNESIUM: Magnesium: 1.8 mg/dL (ref 1.7–2.4)

## 2024-06-15 MED ORDER — SODIUM CHLORIDE 0.9% FLUSH
10.0000 mL | INTRAVENOUS | Status: DC | PRN
  Administered 2024-06-15: 10 mL via INTRAVENOUS

## 2024-06-15 MED ORDER — HEPARIN SOD (PORK) LOCK FLUSH 100 UNIT/ML IV SOLN
500.0000 [IU] | Freq: Once | INTRAVENOUS | Status: AC
  Administered 2024-06-15: 500 [IU] via INTRAVENOUS

## 2024-06-15 NOTE — Patient Instructions (Signed)
 [facility] at [facility] Discharge Instructions   You were seen and examined today by Dr. Ellin Saba.  He reviewed the results of your lab work which are normal/stable.   We will see you back in 4 months. We will repeat lab work prior to this visit.   Return as scheduled.    Thank you for choosing [facility] at [facility] to provide your oncology and hematology care.  To afford each patient quality time with our provider, please arrive at least 15 minutes before your scheduled appointment time.   If you have a lab appointment with the Cancer Center please come in thru the Main Entrance and check in at the main information desk.  You need to re-schedule your appointment should you arrive 10 or more minutes late.  We strive to give you quality time with our providers, and arriving late affects you and other patients whose appointments are after yours.  Also, if you no show three or more times for appointments you may be dismissed from the clinic at the providers discretion.     Again, thank you for choosing [facility].  Our hope is that these requests will decrease the amount of time that you wait before being seen by our physicians.       _____________________________________________________________  Should you have questions after your visit to [facility], please contact our office at [phone] and follow the prompts.  Our office hours are 8:00 a.m. and 4:30 p.m. Monday - Friday.  Please note that voicemails left after 4:00 p.m. may not be returned until the following business day.  We are closed weekends and major holidays.  You do have access to a nurse 24-7, just call the main number to the clinic [phone] and do not press any options, hold on the line and a nurse will answer the phone.    For prescription refill requests, have your pharmacy contact our office and allow 72 hours.    Due to Covid, you will  need to wear a mask upon entering the hospital. If you do not have a mask, a mask will be given to you at the Main Entrance upon arrival. For doctor visits, patients may have 1 support person age 73 or older with them. For treatment visits, patients can not have anyone with them due to social distancing guidelines and our immunocompromised population.

## 2024-06-15 NOTE — Progress Notes (Signed)
 [facility] [address] [place] [address]    Clinic Day:  06/15/2024  Referring physician: [name], MD  Patient Care Team: Meredith Rollene BRAVO, MD as PCP - General   ASSESSMENT & PLAN:   Assessment: 1.  T1 cN0 G3 ER/PR/HER2-left breast IDC: - Screening mammogram on 10/20/2022: Abnormal - Left breast diagnostic mammogram/ultrasound (10/26/2023): Linear oriented pleomorphic calcifications in the lower inner left breast span 5.9 cm.  There has been interval development of irregular mass associated with these calcifications in the lower inner left breast.  Ultrasound of the left breast shows irregular hypoechoic mass in the left breast at 9 o'clock position measuring 2 x 1.3 x 1.6 cm.  No abnormal lymph nodes in the left axilla. - Left breast 9:00 biopsy (11/02/2023): Poorly differentiated IDC, grade 3, ER/PR negative, HER2 (1+) by IHC.  Ki-67: 20%. - Left breast biopsy LIQ posterior extent and anterior extent (11/17/2023): DCIS, comedo, high nuclear grade. - PET scan (11/25/2023): No evidence of metastatic disease. - Left mastectomy and SLNB on 12/08/2023 by Dr. Kallie - Pathology: 3.9 x 2.2 x 1.5 cm invasive poorly differentiated ductal adenocarcinoma, grade 3 with extensive necrosis, high-grade DCIS with necrosis, margins free, negative angiolymphatic invasion.  0/6 lymph nodes involved.  pT2 pN0. - Adjuvant chemotherapy with dose dense AC x 4 followed by weekly paclitaxel  x 12 was recommended. - 4 cycles of dose dense AC from 01/19/2024 through 03/01/2024 - Weekly paclitaxel  started on 03/15/2024, week 12 completed on 05/31/2024   2. Social/Family History: -Lives at home with her daughter. Works full-time with mild physical activity at work. No tobacco use.  -Brother died of lung cancer in his early 40's with a history of tobacco use. No other family history of cancer.   3. BRIP1 heterozygosity: - We reviewed genetic testing results which was positive for  BRIP1 mutation, putting her at high risk for ovarian cancer.  Also at high risk for Fanconi anemia. - She had unilateral salpingo-oophorectomy more than 10 years ago.  I have recommended salpingo-oophorectomy of the remaining ovary.   also recommended that her daughters be tested for the same mutation.    Plan: 1.  T1 cN0 G3 triple negative left breast IDC: - She has completed week 12 of paclitaxel  on 05/31/2024. - We have made a referral to radiation oncology at last visit.  She has not seen them yet. - Reviewed labs today: Normal LFTs.  CBC shows white count is improving at 3.4.  Hemoglobin is 11.3 and platelet count is normal. - We discussed surveillance plan with follow-ups every 4 to 6 months.  She will continue yearly screening mammograms for the right breast. - She was encouraged to meet with the radiation oncology.  Will make another referral. - I have also recommended port flush every 3 months.  RTC 4 months with repeat labs and exam.   2.  Peripheral neuropathy: - Continue Lyrica  75 mg twice daily.  She has occasional burning sensation of the toes particularly at nighttime.  I have given her amitriptyline  25 mg at bedtime which she could not tolerate due to drowsiness.  She can take half tablet of amitriptyline  at bedtime as needed.   3.  Hypomagnesemia: - She could not tolerate supplements.  Magnesium  has normalized today since she completed chemotherapy.    Orders Placed This Encounter  Procedures   MM 3D SCREENING MAMMOGRAM UNILATERAL RIGHT BREAST    Standing Status:   Future    Expected  Date:   10/10/2024    Expiration Date:   06/15/2025    Reason for Exam (SYMPTOM  OR DIAGNOSIS REQUIRED):   breast cancer screening    Is the patient pregnant?:   No    Preferred imaging location?:   [facility]   CBC with Differential    Standing Status:   Future    Expected Date:   10/10/2024    Expiration Date:   01/08/2025   Comprehensive metabolic panel    Standing Status:    Future    Expected Date:   10/10/2024    Expiration Date:   01/08/2025   Magnesium     Standing Status:   Future    Expected Date:   10/10/2024    Expiration Date:   01/08/2025   Cancer antigen 15-3    Standing Status:   Future    Expected Date:   10/10/2024    Expiration Date:   01/08/2025   VITAMIN D  25 Hydroxy (Vit-D Deficiency, Fractures)    Standing Status:   Future    Expected Date:   10/10/2024    Expiration Date:   01/08/2025      [name],acting as a scribe for Alean Stands, MD.,have documented all relevant documentation on the behalf of Alean Stands, MD,as directed by  Alean Stands, MD while in the presence of Alean Stands, MD.  I, Alean Stands MD, have reviewed the above documentation for accuracy and completeness, and I agree with the above.      Alean Stands, MD   7/17/20251:05 PM  CHIEF COMPLAINT:   Diagnosis: triple negative left breast cancer   Cancer Staging  Breast cancer of upper-inner quadrant of left female breast [facility]) Staging form: Breast, AJCC 8th Edition - Clinical stage from 11/15/2023: Stage IIB (cT2, cN0(sn), cM0, G3, ER-, PR-, HER2-) - Signed by Stands Alean, MD on 01/05/2024    Prior Therapy: 1. Left mastectomy and SLNB, 12/08/23 2. Dose dense AC, 01/1924 - 03/01/24  Current Therapy:  weekly paclitaxel    HISTORY OF PRESENT ILLNESS:   Oncology History  Breast cancer of upper-inner quadrant of left female breast (HCC)  11/15/2023 Initial Diagnosis   Breast cancer of upper-inner quadrant of left female breast (HCC)    Genetic Testing   Likely pathogenic variant in BRIP1 called c.3196del (p.Ser1066Hisfs*12) identified on the [facility] Common Hereditary Cancers+RNA panel. VUS in CTNNA1 called c.1070G>A (p.Arg357His) identified. The report date is 11/29/2023.  The Common Hereditary Cancers Panel + RNA offered by [facility] includes sequencing and/or deletion duplication testing of the following 48  genes: APC*, ATM*, AXIN2, BAP1, BARD1, BMPR1A, BRCA1, BRCA2, BRIP1, CDH1, CDK4, CDKN2A (p14ARF), CDKN2A (p16INK4a), CHEK2, CTNNA1, DICER1*, EPCAM*, FH*, GREM1*, HOXB13, KIT, MBD4, MEN1*, MLH1*, MSH2*, MSH3*, MSH6*, MUTYH, NF1*, NTHL1, PALB2, PDGFRA, PMS2*, POLD1*, POLE, PTEN*, RAD51C, RAD51D, SDHA*, SDHB, SDHC*, SDHD, SMAD4, SMARCA4, STK11, TP53, TSC1*, TSC2, VHL.    11/15/2023 Cancer Staging   Staging form: Breast, AJCC 8th Edition - Clinical stage from 11/15/2023: Stage IIB (cT2, cN0(sn), cM0, G3, ER-, PR-, HER2-) - Signed by Stands Alean, MD on 01/05/2024 Histopathologic type: Infiltrating duct carcinoma, NOS Stage prefix: Initial diagnosis Method of lymph node assessment: Sentinel lymph node biopsy Nuclear grade: G3 Histologic grading system: 3 grade system   01/19/2024 -  Chemotherapy   Patient is on Treatment Plan : BREAST DOSE DENSE AC q14d / PACLitaxel  q7d        INTERVAL HISTORY:   Meredith Sanchez is a 58 y.o. female presenting to clinic today  for follow up of triple negative left breast cancer. She was last seen by me on 05/17/2024.  Today, she states that she is doing well overall. Her appetite level is at 100%. Her energy level is at 75%. Meredith Sanchez finished chemotherapy on 05/31/2024. She notes occasional numbness and tingling in the toes and fingertips. She is taking Lyrica  for neuropathy, which is effective in improving symptoms. Meredith Sanchez states she took the half dose of amitriptyline  once and reports it causes drowsiness.   She has not seen radiation oncology and did not schedule an appointment with them, as she would like to start working again.  Meredith Sanchez discontinued magnesium  after finishing treatment.   PAST MEDICAL HISTORY:   Past Medical History: Past Medical History:  Diagnosis Date   Allergic rhinitis    Family history of breast cancer    Hyperlipidemia    Hypertension    Myocardial infarction [facility])    Obesity     Surgical History: Past Surgical History:   Procedure Laterality Date   ABDOMINAL HYSTERECTOMY     APPENDECTOMY  11/30/2001   BREAST BIOPSY Left 11/02/2023   path pending   BREAST BIOPSY Left 11/02/2023   US  LT BREAST BX W LOC DEV 1ST LESION IMG BX SPEC US  GUIDE 11/02/2023 [facility]   BREAST BIOPSY Left 11/17/2023   MM LT BREAST BX W LOC DEV 1ST LESION IMAGE BX SPEC STEREO GUIDE 11/17/2023 [facility]MOGRAP[facility]   BREAST BIOPSY Left 11/17/2023   MM LT BREAST BX W LOC DEV EA AD LESION IMG BX SPEC STEREO GUIDE 11/17/2023 [facility]   COLONOSCOPY N/A 08/20/2014   Procedure: COLONOSCOPY;  Surgeon: Margo LITTIE Haddock, MD;  Location: [facility] ENDO SUITE;  Service: Endoscopy;  Laterality: N/A;  2:15pm   LEFT OOPHORECTOMY  11/30/2001   PORTACATH PLACEMENT Right 12/08/2023   Procedure: INSERTION SUBCLAVIAN PORT-A-CATH;  Surgeon: Kallie Manuelita BROCKS, MD;  Location: [facility] ORS;  Service: General;  Laterality: Right;   SIMPLE MASTECTOMY WITH AXILLARY SENTINEL NODE BIOPSY Left 12/08/2023   Procedure: SIMPLE MASTECTOMY WITH AXILLARY SENTINEL NODE BIOPSY;  Surgeon: Kallie Manuelita BROCKS, MD;  Location: [facility] ORS;  Service: General;  Laterality: Left;   TUBAL LIGATION     VESICOVAGINAL FISTULA CLOSURE W/ TAH  11/30/2001    Social History: Social History   Socioeconomic History   Marital status: Married    Spouse name: Not on file   Number of children: 3   Years of education: Not on file   Highest education level: Not on file  Occupational History   Occupation: employed     Comment: [employer]  Tobacco Use   Smoking status: Never   Smokeless tobacco: Never  Vaping Use   Vaping status: Never Used  Substance and Sexual Activity   Alcohol use: No   Drug use: No   Sexual activity: Not on file  Other Topics Concern   Not on file  Social History Narrative   Not on file   Social Drivers of Health   Financial Resource Strain: High Risk (01/19/2024)   Overall Financial Resource Strain (CARDIA)    Difficulty of Paying Living Expenses: Very hard  Food  Insecurity: Patient Declined (12/08/2023)   Hunger Vital Sign    Worried About Running Out of Food in the Last Year: Patient declined    Ran Out of Food in the Last Year: Patient declined  Transportation Needs: Patient Declined (12/08/2023)   PRAPARE - Administrator, Civil Service (Medical): Patient declined    Lack of  Transportation (Non-Medical): Patient declined  Physical Activity: Inactive (12/08/2018)   [facility]   Exercise Vital Sign    Days of Exercise per Week: 0 days    Minutes of Exercise per Session: 0 min  Stress: Not on file  Social Connections: Not on file  Intimate Partner Violence: Patient Declined (12/08/2023)   Humiliation, Afraid, Rape, and Kick questionnaire    Fear of Current or Ex-Partner: Patient declined    Emotionally Abused: Patient declined    Physically Abused: Patient declined    Sexually Abused: Patient declined    Family History: Family History  Problem Relation Age of Onset   Diabetes Mother    Hypertension Mother    Hypertension Father    Lung cancer Brother 7       metastatic lung cancer to brain   Breast cancer Cousin 52       pat first cousin   Colon cancer Neg Hx     Current Medications:  Current Outpatient Medications:    amitriptyline  (ELAVIL ) 25 MG tablet, Take 1 tablet (25 mg total) by mouth at bedtime., Disp: 30 tablet, Rfl: 1   amlodipine -olmesartan  (AZOR ) 10-20 MG tablet, TAKE 1 TABLET BY MOUTH DAILY, Disp: 90 tablet, Rfl: 3   APPLE CIDER VINEGAR PO, Take 1 capsule by mouth daily., Disp: , Rfl:    Ascorbic Acid (VITAMIN C PO), Take 1 capsule by mouth daily., Disp: , Rfl:    ASHWAGANDHA PO, Take 2 tablets by mouth daily., Disp: , Rfl:    ASPIRIN  81 PO, Take 1 tablet by mouth daily., Disp: , Rfl:    atorvastatin  (LIPITOR) 40 MG tablet, TAKE 1 TABLET(40 MG) BY MOUTH DAILY, Disp: 90 tablet, Rfl: 3   docusate sodium  (COLACE) 100 MG capsule, Take 1 capsule (100 mg total) by mouth 2 (two) times daily as needed  for mild constipation., Disp: 30 capsule, Rfl: 0   famotidine  (PEPCID ) 40 MG tablet, Take 1 tablet (40 mg total) by mouth daily., Disp: 30 tablet, Rfl: 5   furosemide  (LASIX ) 20 MG tablet, Take 1 tablet (20 mg total) by mouth daily as needed for edema., Disp: 30 tablet, Rfl: 2   lidocaine -prilocaine  (EMLA ) cream, Apply to affected area once, Disp: 30 g, Rfl: 3   loratadine  (CLARITIN ) 10 MG tablet, Take 1 tablet (10 mg total) by mouth daily., Disp: 30 tablet, Rfl: 5   magnesium  oxide (MAG-OX) 400 (240 Mg) MG tablet, Take 1 tablet (400 mg total) by mouth daily., Disp: 30 tablet, Rfl: 3   Multiple Vitamin (MULTIVITAMIN) tablet, Take 1 tablet by mouth daily., Disp: , Rfl:    ondansetron  (ZOFRAN -ODT) 4 MG disintegrating tablet, Take 1 tablet (4 mg total) by mouth every 6 (six) hours as needed for nausea., Disp: 20 tablet, Rfl: 0   PACLitaxel  (TAXOL  IV), Inject into the vein., Disp: , Rfl:    Pegfilgrastim  (NEULASTA  [place]), Inject into the skin., Disp: , Rfl:    pregabalin  (LYRICA ) 75 MG capsule, Take 1 capsule (75 mg total) by mouth 3 (three) times daily., Disp: 90 capsule, Rfl: 3   prochlorperazine  (COMPAZINE ) 10 MG tablet, Take 1 tablet (10 mg total) by mouth every 6 (six) hours as needed for nausea or vomiting., Disp: 60 tablet, Rfl: 3   triamterene -hydrochlorothiazide  (MAXZIDE -25) 37.5-25 MG tablet, TAKE 1 TABLET BY MOUTH DAILY, Disp: 90 tablet, Rfl: 3   Allergies: Allergies  Allergen Reactions   Ace Inhibitors Shortness Of Breath    Roof of mouth  Tingling, throat closing  Penicillins     Yeast infections    Poison Ivy Extract Rash    REVIEW OF SYSTEMS:   Review of Systems  Constitutional:  Negative for chills, fatigue and fever.  HENT:   Negative for lump/mass, mouth sores, nosebleeds, sore throat and trouble swallowing.   Eyes:  Negative for eye problems.  Respiratory:  Negative for cough and shortness of breath.   Cardiovascular:  Negative for chest pain, leg swelling and  palpitations.  Gastrointestinal:  Positive for diarrhea. Negative for abdominal pain, constipation, nausea and vomiting.  Genitourinary:  Negative for bladder incontinence, difficulty urinating, dysuria, frequency, hematuria and nocturia.   Musculoskeletal:  Negative for arthralgias, back pain, flank pain, myalgias and neck pain.  Skin:  Negative for itching and rash.  Neurological:  Positive for numbness (and tingling in hands and feet). Negative for dizziness and headaches.  Hematological:  Does not bruise/bleed easily.  Psychiatric/Behavioral:  Negative for depression, sleep disturbance and suicidal ideas. The patient is not nervous/anxious.   All other systems reviewed and are negative.    VITALS:   There were no vitals taken for this visit.  Wt Readings from Last 3 Encounters:  06/15/24 188 lb (85.3 kg)  05/31/24 183 lb 13.8 oz (83.4 kg)  05/24/24 185 lb 3.2 oz (84 kg)    There is no height or weight on file to calculate BMI.  Performance status (ECOG): 1 - Symptomatic but completely ambulatory  PHYSICAL EXAM:   Physical Exam Vitals and nursing note reviewed. Exam conducted with a chaperone present.  Constitutional:      Appearance: Normal appearance.  Cardiovascular:     Rate and Rhythm: Normal rate and regular rhythm.     Pulses: Normal pulses.     Heart sounds: Normal heart sounds.  Pulmonary:     Effort: Pulmonary effort is normal.     Breath sounds: Normal breath sounds.  Abdominal:     Palpations: Abdomen is soft. There is no hepatomegaly, splenomegaly or mass.     Tenderness: There is no abdominal tenderness.  Musculoskeletal:     Right lower leg: No edema.     Left lower leg: No edema.  Lymphadenopathy:     Cervical: No cervical adenopathy.     Right cervical: No superficial, deep or posterior cervical adenopathy.    Left cervical: No superficial, deep or posterior cervical adenopathy.     Upper Body:     Right upper body: No supraclavicular or axillary  adenopathy.     Left upper body: No supraclavicular or axillary adenopathy.  Neurological:     General: No focal deficit present.     Mental Status: She is alert and oriented to person, place, and time.  Psychiatric:        Mood and Affect: Mood normal.        Behavior: Behavior normal.     LABS:   CBC     Component Value Date/Time   WBC 3.4 (L) 06/15/2024 1048   RBC 3.26 (L) 06/15/2024 1048   HGB 11.3 (L) 06/15/2024 1048   HGB 12.7 04/09/2023 1417   HCT 33.7 (L) 06/15/2024 1048   HCT 36.4 04/09/2023 1417   PLT 338 06/15/2024 1048   PLT 359 04/09/2023 1417   MCV 103.4 (H) 06/15/2024 1048   MCV 91 04/09/2023 1417   MCH 34.7 (H) 06/15/2024 1048   MCHC 33.5 06/15/2024 1048   RDW 12.7 06/15/2024 1048   RDW 13.7 04/09/2023 1417   LYMPHSABS 0.4 (L) 06/15/2024  1048   MONOABS 0.4 06/15/2024 1048   EOSABS 0.0 06/15/2024 1048   BASOSABS 0.0 06/15/2024 1048    CMP      Component Value Date/Time   NA 140 06/15/2024 1048   NA 140 09/10/2023 1343   K 3.1 (L) 06/15/2024 1048   CL 106 06/15/2024 1048   CO2 22 06/15/2024 1048   GLUCOSE 165 (H) 06/15/2024 1048   BUN 20 06/15/2024 1048   BUN 27 (H) 09/10/2023 1343   CREATININE 0.95 06/15/2024 1048   CREATININE 0.81 01/10/2020 0908   CALCIUM  9.5 06/15/2024 1048   PROT 7.2 06/15/2024 1048   PROT 7.2 09/10/2023 1343   ALBUMIN 3.8 06/15/2024 1048   ALBUMIN 4.5 09/10/2023 1343   AST 29 06/15/2024 1048   ALT 28 06/15/2024 1048   ALKPHOS 51 06/15/2024 1048   BILITOT 0.8 06/15/2024 1048   BILITOT 0.5 09/10/2023 1343   GFRNONAA >60 06/15/2024 1048   GFRNONAA 82 01/10/2020 0908   GFRAA 95 01/10/2020 0908     No results found for: CEA1, CEA / No results found for: CEA1, CEA No results found for: PSA1 No results found for: CAN199 No results found for: CAN125  No results found for: STEPHANY RINGS, A1GS, A2GS, BETS, BETA2SER, GAMS, MSPIKE, SPEI Lab Results  Component Value Date   TIBC  341 03/29/2024   TIBC 344 08/17/2014   FERRITIN 331 (H) 03/29/2024   FERRITIN 348 (H) 08/17/2014   FERRITIN 134 07/09/2014   IRONPCTSAT 17 03/29/2024   IRONPCTSAT 28 08/17/2014   No results found for: LDH   STUDIES:   No results found.

## 2024-06-15 NOTE — Progress Notes (Signed)
 Patients port flushed without difficulty.  Good blood return noted with no bruising or swelling noted at site.  Band aid applied.  VSS with discharge and left in satisfactory condition with no s/s of distress noted.

## 2024-06-16 ENCOUNTER — Other Ambulatory Visit: Payer: Self-pay

## 2024-06-20 ENCOUNTER — Encounter: Payer: Self-pay | Admitting: Genetic Counselor

## 2024-06-20 ENCOUNTER — Ambulatory Visit: Payer: Self-pay | Admitting: Genetic Counselor

## 2024-06-20 ENCOUNTER — Telehealth: Payer: Self-pay | Admitting: Genetic Counselor

## 2024-06-20 ENCOUNTER — Other Ambulatory Visit: Payer: Self-pay

## 2024-06-20 DIAGNOSIS — C50212 Malignant neoplasm of upper-inner quadrant of left female breast: Secondary | ICD-10-CM

## 2024-06-20 DIAGNOSIS — Z1379 Encounter for other screening for genetic and chromosomal anomalies: Secondary | ICD-10-CM

## 2024-06-20 NOTE — Telephone Encounter (Signed)
 LM on VM that we had updated information on her genetic test results and to please call.  Left CB instructions.

## 2024-06-20 NOTE — Progress Notes (Unsigned)
 HPI:  Ms. Trias was previously seen in the [facility]etics clinic due to a {Personal/family:20331} history of {cancer/polyps} and concerns regarding a hereditary predisposition to cancer. Please refer to our prior cancer genetics clinic note for more information regarding our discussion, assessment and recommendations, at the time. Ms. Odonnel recent genetic test results were disclosed to her, as were recommendations warranted by these results. These results and recommendations are discussed in more detail below.  CANCER HISTORY:  Oncology History  Breast cancer of upper-inner quadrant of left female breast (HCC)  11/15/2023 Initial Diagnosis   Breast cancer of upper-inner quadrant of left female breast (HCC)    Genetic Testing   UPDATE: BRIP1 c.3196del downgraded to VUS.  This is no longer a likely pathogenic variant.    Likely pathogenic variant in BRIP1 called c.3196del (p.Ser1066Hisfs*12) identified on the [facility] Common Hereditary Cancers+RNA panel. VUS in CTNNA1 called c.1070G>A (p.Arg357His) identified. The report date is 11/29/2023.  The Common Hereditary Cancers Panel + RNA offered by [facility] includes sequencing and/or deletion duplication testing of the following 48 genes: APC*, ATM*, AXIN2, BAP1, BARD1, BMPR1A, BRCA1, BRCA2, BRIP1, CDH1, CDK4, CDKN2A (p14ARF), CDKN2A (p16INK4a), CHEK2, CTNNA1, DICER1*, EPCAM*, FH*, GREM1*, HOXB13, KIT, MBD4, MEN1*, MLH1*, MSH2*, MSH3*, MSH6*, MUTYH, NF1*, NTHL1, PALB2, PDGFRA, PMS2*, POLD1*, POLE, PTEN*, RAD51C, RAD51D, SDHA*, SDHB, SDHC*, SDHD, SMAD4, SMARCA4, STK11, TP53, TSC1*, TSC2, VHL.    11/15/2023 Cancer Staging   Staging form: Breast, AJCC 8th Edition - Clinical stage from 11/15/2023: Stage IIB (cT2, cN0(sn), cM0, G3, ER-, PR-, HER2-) - Signed by Rogers Hai, MD on 01/05/2024 Histopathologic type: Infiltrating duct carcinoma, NOS Stage prefix: Initial diagnosis Method of lymph node assessment: Sentinel lymph node  biopsy Nuclear grade: G3 Histologic grading system: 3 grade system   01/19/2024 -  Chemotherapy   Patient is on Treatment Plan : BREAST DOSE DENSE AC q14d / PACLitaxel  q7d       FAMILY HISTORY:  We obtained a detailed, 4-generation family history.  Significant diagnoses are listed below: Family History  Problem Relation Age of Onset   Diabetes Mother    Hypertension Mother    Hypertension Father    Lung cancer Brother 14       metastatic lung cancer to brain   Breast cancer Cousin 29       pat first cousin   Colon cancer Neg Hx     ***  GENETIC TEST RESULTS: Genetic testing reported out on {date} through the *** cancer panel found no pathogenic mutations. {GENE LIST}. The test report has been scanned into EPIC and is located under the Molecular Pathology section of the Results Review tab.  A portion of the result report is included below for reference.   ***  We discussed with Ms. Thurmon that because current genetic testing is not perfect, it is possible there may be a gene mutation in one of these genes that current testing cannot detect, but that chance is small.  We also discussed, that there could be another gene that has not yet been discovered, or that we have not yet tested, that is responsible for the cancer diagnoses in the family. It is also possible there is a hereditary cause for the cancer in the family that Ms. Gallo did not inherit and therefore was not identified in her testing.  Therefore, it is important to remain in touch with cancer genetics in the future so that we can continue to offer Ms. Schoenberger the most up to date genetic testing.   ***  Genetic testing did identify a variant of uncertain significance (VUS) was identified in the *** gene called ***.  At this time, it is unknown if this variant is associated with increased cancer risk or if this is a normal finding, but most variants such as this get reclassified to being inconsequential. It should not be used to make  medical management decisions. With time, we suspect the lab will determine the significance of this variant, if any. If we do learn more about it, we will try to contact Ms. Desir to discuss it further. However, it is important to stay in touch with us  periodically and keep the address and phone number up to date.  ***Genetic testing did identify {number} Variants of uncertain significance (VUS) - one in the *** gene called ***, a second in the *** gene called ***, and a third in the *** gene called ***.  At this time, it is unknown if these variants are associated with increased cancer risk or if they are normal findings, but most variants such as these get reclassified to being inconsequential. They should not be used to make medical management decisions. With time, we suspect the lab will determine the significance of these variants, if any. If we do learn more about them, we will try to contact Ms. Cardona to discuss it further. However, it is important to stay in touch with us  periodically and keep the address and phone number up to date.  ***[For true negative] We recommended Ms. Amezcua pursue testing for the familial hereditary cancer gene mutation called ***, ***. Ms. Thibodaux test was normal and did not reveal the familial mutation. We call this result a true negative result because the cancer-causing mutation was identified in Ms. Wire's family, and she did not inherit it.  Given this negative result, Ms. Bloxham's chances of developing ***-related cancers are the same as they are in the general population.    ADDITIONAL GENETIC TESTING: We discussed with Ms. Heinrichs that there are other genes that are associated with increased cancer risk that can be analyzed. Should Ms. Loisel wish to pursue additional genetic testing, we are happy to discuss and coordinate this testing, at any time.    ***We discussed with Ms. Stegeman that her genetic testing was fairly extensive.  If there are genes identified to  increase cancer risk that can be analyzed in the future, we would be happy to discuss and coordinate this testing at that time.    CANCER SCREENING RECOMMENDATIONS: Ms. Calkin test result is considered negative (normal).  This means that we have not identified a hereditary cause for her {Personal/family:20331} history of {cancer/polyps} at this time. Most cancers happen by chance and this negative test suggests that her {Personal/family:20331} history of {cancer/polyps} may fall into this category.    Possible reasons for Ms. Mintzer's negative genetic test include:  1. There may be a gene mutation in one of these genes that current testing methods cannot detect but that chance is small.  2. There could be another gene that has not yet been discovered, or that we have not yet tested, that is responsible for the cancer diagnoses in the family.  3.  There may be no hereditary risk for cancer in the family. The cancers in Ms. Vancamp and/or her family may be sporadic/familial or due to other genetic and environmental factors. 4. It is also possible there is a hereditary cause for the cancer in the family that Ms. Maple did not inherit.  Therefore,  it is recommended she continue to follow the cancer management and screening guidelines provided by her ***oncology and primary healthcare provider. An individual's cancer risk and medical management are not determined by genetic test results alone. Overall cancer risk assessment incorporates additional factors, including personal medical history, family history, and any available genetic information that may result in a personalized plan for cancer prevention and surveillance  ***[For affected/unaffected do not believe] Given Ms. Ferrufino's {Personal/family:20331} histories, we must interpret these negative results with some caution.  Families with features suggestive of hereditary risk for cancer tend to have multiple family members with cancer, diagnoses in multiple  generations and diagnoses before the age of 55. Ms. Wahlstrom family exhibits some of these features. Thus, this result may simply reflect our current inability to detect all mutations within these genes or there may be a different gene that has not yet been discovered or tested.   An individual's cancer risk and medical management are not determined by genetic test results alone. Overall cancer risk assessment incorporates additional factors, including personal medical history, family history, and any available genetic information that may result in a personalized plan for cancer prevention and surveillance.  ****[BREAST CANCER RISK] Based on Ms. Sara's {Personal/family:20331} of cancer, as well as her genetic test results, statistical models ({GENMODELS:62370})  and literature data were used to estimate her risk of developing {CA HX:54794}. This estimates her lifetime risk of developing {CA HX:54794} to be approximately ***% to ***%.  The patient's lifetime breast cancer risk is a preliminary estimate based on available information using one of several models endorsed by the [employer] (NCCN).  The NCCN recommends consideration of breast MRI screening as an adjunct to mammography for patients at high risk (defined as 20% or greater lifetime risk). Please note that a woman's breast cancer risk changes over time. It may increase or decrease based on age and any changes to the personal and/or family medical history. The risks and recommendations listed above apply to this patient at this point in time. In the future, she may or may not be eligible for the same medical management strategies and, in some cases, other medical management strategies may become available to her. If she is interested in an updated breast cancer risk assessment at a later date, she can contact us .  ***The Tyrer-Cuzick model is one of multiple prediction models developed to estimate an individual's lifetime  risk of developing breast cancer. The Tyrer-Cuzick model is endorsed by the [employer] (NCCN). This model includes many risk factors such as family history, endogenous estrogen exposure, and benign breast disease. The calculation is highly-dependent on the accuracy of clinical data provided by the patient and can change over time. The Tyrer-Cuzick model may be repeated to reflect new information in her personal or family history in the future.    ***[HIGH RISK BREAST] Ms. Fischman has been determined to be at high risk for breast cancer. her Tyrer-Cuzick risk score is ***%.  For women with a greater than 20% lifetime risk of breast cancer, the [employer] (NCCN) recommends the following:   1.      Clinical encounter every 6-12 months to begin when identified as being at increased risk, but not before age 25  2.      Annual mammograms. Tomosynthesis is recommended starting 10 years earlier than the youngest breast cancer diagnosis in the family or at age 82 (whichever comes first), but not before age 8  3.      Annual breast MRI starting 10 years earlier than the youngest breast cancer diagnosis in the family or at age 45 (whichever comes first), but not before age 80.   ***[Breast High Risk FU]  We, therefore, discussed that it is reasonable for Ms. Drohan to be followed by a high-risk breast cancer clinic; in addition to a yearly mammogram and physical exam by a healthcare provider, she should discuss the usefulness of an annual breast MRI with the high-risk clinic providers.  We have made a referral to the [facility].  ***  ***[GI High Risk FU] This negative genetic test simply tells us  that we cannot yet define why Ms. Humphries has had *** an increased number of colorectal polyps. *** colorectal cancer at a young age. Ms. Achorn medical management and screening should be based on the prospect that she *** will likely form more colon  polyps  *** may be at an increased risk for a second colorectal cancer in the future and should, therefore, undergo more frequent colonoscopy screening at intervals determined by her GI providers.  We also recommended that Ms. Cobey have an upper endoscopy periodically.  RECOMMENDATIONS FOR FAMILY MEMBERS:   Since she did not inherit a identifiable mutation in a cancer predisposition gene included on this panel, her children could not have inherited a known mutation from her in one of these genes. Individuals in this family might be at some increased risk of developing cancer, over the general population risk, simply due to the family history of cancer.  We recommended women in this family have a yearly mammogram beginning at age 57, or 24 years younger than the earliest onset of cancer, an annual clinical breast exam, and perform monthly breast self-exams. Women in this family should also have a gynecological exam as recommended by their primary provider. All family members should be referred for colonoscopy starting at age 34, or 34 years younger than the earliest onset of cancer. *** It is also possible there is a hereditary cause for the cancer in Ms. Seckel's family that she did not inherit and therefore was not identified in her.  Based on Ms. Kielbasa's family history, we recommended her ***, who was diagnosed with *** at age ***, have genetic counseling and testing. Ms. Cranmer will let us  know if we can be of any assistance in coordinating genetic counseling and/or testing for this family member.   FOLLOW-UP: Lastly, we discussed with Ms. Darwish that cancer genetics is a rapidly advancing field and it is possible that new genetic tests will be appropriate for her and/or her family members in the future. We encouraged her to remain in contact with cancer genetics on an annual basis so we can update her personal and family histories and let her know of advances in cancer genetics that may benefit this family.    Our contact number was provided. Ms. Wiley questions were answered to her satisfaction, and she knows she is welcome to call us  at anytime with additional questions or concerns.   Darice Monte, MS, [facility] Licensed, Certified Genetic Counselor [email or web address]

## 2024-06-20 NOTE — Telephone Encounter (Signed)
 Revealed that an amended report from [facility] has downgraded the BRIP1 mutation from Likely Pathogenic to a VUS.  This means that we no longer feel that this mutation is harmful/causes cancer, and we would not change her medical management nor would we offer genetic testing to family members.  Patient confirmed that other family members have not undergone genetic testing at this time.  I let her know that I have taken out the documentation of BRIP1 in her problem list, and that she should be mindful that it still is in her chart, so other providers may not see the corrected information.  She states she understands this information.

## 2024-06-21 ENCOUNTER — Encounter: Payer: Self-pay | Admitting: Hematology

## 2024-06-21 ENCOUNTER — Ambulatory Visit: Admitting: Licensed Clinical Social Worker

## 2024-06-21 DIAGNOSIS — Z171 Estrogen receptor negative status [ER-]: Secondary | ICD-10-CM

## 2024-06-21 NOTE — Progress Notes (Signed)
 [facility] CSW Progress Note  Clinical Child psychotherapist contacted patient by phone to follow-up on financial concerns.    Interventions: CSW spoke with patient regarding financial concerns.  Pt reports her employer let her go due to excessive absences while undergoing treatment.  Pt has completed treatment and reports she is actively looking for work at this time, but has no income and has needed to move in with family.  CSW had sent the link for the [employer] grant to pt at the beginning of treatment which she has not applied for.  CSW encouraged pt to go online and apply for the grant as she should still qualify given how recently she completed treatment.  CSW to send a message to Wadie Rung on behalf of pt to see if they are able to offer any financial assistance.  CSW provided pt w/ the telephone number for Legal [employer]ide to see if there is legal recourse for her former employer.        Follow Up Plan:  Patient will contact CSW with any support or resource needs    Devere JONELLE Manna, LCSW Clinical Social Worker [facility]    Patient is participating in a Managed Medicaid Plan:  Yes

## 2024-07-06 ENCOUNTER — Encounter (INDEPENDENT_AMBULATORY_CARE_PROVIDER_SITE_OTHER): Payer: Self-pay | Admitting: *Deleted

## 2024-09-05 ENCOUNTER — Other Ambulatory Visit: Payer: Self-pay | Admitting: Family Medicine

## 2024-09-05 MED ORDER — LORATADINE 10 MG PO TABS: 10.0000 mg | ORAL_TABLET | Freq: Every day | ORAL | 5 refills | Status: DC

## 2024-09-05 NOTE — Telephone Encounter (Signed)
 Copied from CRM #[id number]. Topic: Clinical - Medication Refill >> Sep 05, 2024 11:34 AM Emylou G wrote: Medication: loratadine  (CLARITIN ) 10 MG tablet  Please do 90 day supply  Has the patient contacted their pharmacy? No (Agent: If no, request that the patient contact the pharmacy for the refill. If patient does not wish to contact the pharmacy document the reason why and proceed with request.) (Agent: If yes, when and what did the pharmacy advise?)  This is the patient's preferred pharmacy:  [facility] [phone] - [place], [place] - [address] AT [facility] [address] [place] [address] Phone: [phone] Fax: [phone]  Is this the correct pharmacy for this prescription? Yes If no, delete pharmacy and type the correct one.   Has the prescription been filled recently? No  Is the patient out of the medication? Yes  Has the patient been seen for an appointment in the last year OR does the patient have an upcoming appointment? Yes  Can we respond through MyChart? Yes  Agent: Please be advised that Rx refills may take up to 3 business days. We ask that you follow-up with your pharmacy.

## 2024-10-02 ENCOUNTER — Other Ambulatory Visit: Payer: Self-pay | Admitting: *Deleted

## 2024-10-03 NOTE — Telephone Encounter (Signed)
 For her chemo induced neuropathy

## 2024-10-03 NOTE — Telephone Encounter (Signed)
 Are we prescribing Lyrica  for?  Delon Hope, NP 10/03/2024 10:13 AM

## 2024-10-06 ENCOUNTER — Other Ambulatory Visit: Payer: Self-pay

## 2024-10-09 ENCOUNTER — Encounter: Payer: Self-pay | Admitting: Oncology

## 2024-10-09 MED ORDER — PREGABALIN 75 MG PO CAPS: 75.0000 mg | ORAL_CAPSULE | Freq: Three times a day (TID) | ORAL | 3 refills | Status: AC

## 2024-10-12 ENCOUNTER — Other Ambulatory Visit: Payer: Self-pay

## 2024-10-12 ENCOUNTER — Telehealth: Payer: Self-pay | Admitting: Family Medicine

## 2024-10-12 DIAGNOSIS — R7303 Prediabetes: Secondary | ICD-10-CM

## 2024-10-12 DIAGNOSIS — E785 Hyperlipidemia, unspecified: Secondary | ICD-10-CM

## 2024-10-12 DIAGNOSIS — I10 Essential (primary) hypertension: Secondary | ICD-10-CM

## 2024-10-12 NOTE — Telephone Encounter (Signed)
 Ordered, pt informed.

## 2024-10-12 NOTE — Telephone Encounter (Signed)
 Copied from CRM #[id number]. Topic: Clinical - Request for Lab/Test Order >> Oct 12, 2024 12:02 PM Olam RAMAN wrote: Reason for CRM: pt is requesting labs for cholesterol and sugar checked. Cb [phone]

## 2024-10-12 NOTE — Telephone Encounter (Signed)
 Pals order fasting lipid, HBA1C and TSH and let her know

## 2024-10-17 LAB — HEMOGLOBIN A1C
Est. average glucose Bld gHb Est-mCnc: 123 mg/dL
Hgb A1c MFr Bld: 5.9 % — ABNORMAL HIGH (ref 4.8–5.6)

## 2024-10-17 LAB — TSH: TSH: 3.43 u[IU]/mL (ref 0.450–4.500)

## 2024-10-17 LAB — LIPID PANEL
Chol/HDL Ratio: 3.9 ratio (ref 0.0–4.4)
Cholesterol, Total: 163 mg/dL (ref 100–199)
HDL: 42 mg/dL (ref >39)
LDL Chol Calc (NIH): 82 mg/dL (ref 0–99)
Triglycerides: 233 mg/dL — ABNORMAL HIGH (ref 0–149)
VLDL Cholesterol Cal: 39 mg/dL (ref 5–40)

## 2024-10-18 ENCOUNTER — Ambulatory Visit: Admitting: Family Medicine

## 2024-10-18 ENCOUNTER — Encounter: Payer: Self-pay | Admitting: Family Medicine

## 2024-10-18 VITALS — BP 112/75 | HR 81 | Resp 95 | Ht 66.0 in | Wt 178.0 lb

## 2024-10-18 DIAGNOSIS — E785 Hyperlipidemia, unspecified: Secondary | ICD-10-CM | POA: Diagnosis not present

## 2024-10-18 DIAGNOSIS — I10 Essential (primary) hypertension: Secondary | ICD-10-CM | POA: Diagnosis not present

## 2024-10-18 DIAGNOSIS — Z23 Encounter for immunization: Secondary | ICD-10-CM | POA: Diagnosis not present

## 2024-10-18 DIAGNOSIS — R7303 Prediabetes: Secondary | ICD-10-CM | POA: Diagnosis not present

## 2024-10-18 DIAGNOSIS — E663 Overweight: Secondary | ICD-10-CM

## 2024-10-18 DIAGNOSIS — G629 Polyneuropathy, unspecified: Secondary | ICD-10-CM

## 2024-10-18 MED ORDER — AMLODIPINE-OLMESARTAN 10-20 MG PO TABS
1.0000 | ORAL_TABLET | Freq: Every day | ORAL | 3 refills | Status: AC
Start: 2024-10-18 — End: ?

## 2024-10-18 MED ORDER — ATORVASTATIN CALCIUM 40 MG PO TABS: 40.0000 mg | ORAL_TABLET | Freq: Every day | ORAL | 3 refills | Status: AC

## 2024-10-18 MED ORDER — TRIAMTERENE-HCTZ 37.5-25 MG PO TABS
1.0000 | ORAL_TABLET | Freq: Every day | ORAL | 3 refills | Status: AC
Start: 2024-10-18 — End: ?

## 2024-10-18 MED ORDER — LORATADINE 10 MG PO TABS: 10.0000 mg | ORAL_TABLET | Freq: Every day | ORAL | 3 refills | Status: AC

## 2024-10-18 MED ORDER — EZETIMIBE 10 MG PO TABS: 10.0000 mg | ORAL_TABLET | Freq: Every day | ORAL | 2 refills | Status: AC

## 2024-10-18 NOTE — Patient Instructions (Addendum)
 F/U in 6 months  Fasting lipid, cmp andEGFR, hBA1C 1 week before next appointment  Flu vaccine today  Nurse visit for pneumonia vaccine in 1 week  Nurse visit for hep B#1 in 2 weeks  Nurse visit for Hep B#2 in 10 weeks   Pls get covid vaccine at your pharmacy  Nurse pls print and give pt the letter for her to complete for her colonoscopy  Thanks for choosing [facility], we consider it a privelige to serve you.

## 2024-10-19 ENCOUNTER — Other Ambulatory Visit: Payer: Self-pay

## 2024-10-19 DIAGNOSIS — Z23 Encounter for immunization: Secondary | ICD-10-CM | POA: Insufficient documentation

## 2024-10-19 NOTE — Assessment & Plan Note (Signed)
  Patient re-educated about  the importance of commitment to a  minimum of 150 minutes of exercise per week as able.  The importance of healthy food choices with portion control discussed, as well as eating regularly and within a 12 hour window most days. The need to choose clean , green food 50 to 75% of the time is discussed, as well as to make water  the primary drink and set a goal of 64 ounces water  daily.       10/18/2024   12:57 PM 06/15/2024   10:46 AM 05/31/2024   10:00 AM  Weight /BMI  Weight 178 lb 0.6 oz 188 lb 183 lb 13.8 oz  Height 5' 6 (1.676 m)    BMI 28.74 kg/m2 30.34 kg/m2 29.68 kg/m2    Improved with change in diet, she is applauded on this

## 2024-10-19 NOTE — Assessment & Plan Note (Signed)
 After obtaining informed consent, the vaccine is  administered , with no adverse effect noted at the time of administration.

## 2024-10-19 NOTE — Progress Notes (Signed)
 Meredith Sanchez     MRN: [id number]      DOB: 12-10-1965  Chief Complaint  Patient presents with   Hypertension    6 month follow up     HPI Meredith Sanchez is here for follow up and re-evaluation of chronic medical conditions, medication management and review of any available recent lab and radiology data.  Preventive health is updated, specifically  Cancer screening and Immunization.   Has upcoming appt with Oncology, has not been taking lyrica  3 times daily as prescribed and c/o numbness in fingers more than toes, no adverse s/e noted , will inc to 3 times daily and address at upcoming visit The PT denies any adverse reactions to current medications since the last visit.     ROS Denies recent fever or chills. Denies sinus pressure, nasal congestion, ear pain or sore throat. Denies chest congestion, productive cough or wheezing. Denies chest pains, palpitations and leg swelling Denies abdominal pain, nausea, vomiting,diarrhea or constipation.   Denies dysuria, frequency, hesitancy or incontinence. Denies joint pain, swelling and limitation in mobility. Denies headaches, seizures,Denies depression, anxiety or insomnia. Denies skin break down or rash.   PE  BP 112/75   Pulse 81   Resp (!) 95   Ht 5' 6 (1.676 m)   Wt 178 lb 0.6 oz (80.8 kg)   BMI 28.74 kg/m   Patient alert and oriented and in no cardiopulmonary distress.  HEENT: No facial asymmetry, EOMI,     Neck supple .  Chest: Clear to auscultation bilaterally.  CVS: S1, S2 no murmurs, no S3.Regular rate.  ABD: Soft non tender.   Ext: No edema  MS: Adequate ROM spine, shoulders, hips and knees.  Skin: Intact, no ulcerations or rash noted.  Psych: Good eye contact, normal affect. Memory intact not anxious or depressed appearing.  CNS: CN 2-12 intact, power,  normal throughout.no focal deficits noted.   Assessment & Plan  Influenza vaccination administered at current visit After obtaining informed consent,  the vaccine is  administered , with no adverse effect noted at the time of administration.   Hyperlipidemia LDL goal <100 Hyperlipidemia:Low fat diet discussed and encouraged.   Lipid Panel  Lab Results  Component Value Date   CHOL 163 10/16/2024   HDL 42 10/16/2024   LDLCALC 82 10/16/2024   TRIG 233 (H) 10/16/2024   CHOLHDL 3.9 10/16/2024     Needs to reduce fat in diet, zetia added , continue lipitor Updated lab needed at/ before next visit.   Prediabetes Patient educated about the importance of limiting  Carbohydrate intake , the need to commit to daily physical activity for a minimum of 30 minutes , and to commit weight loss. The fact that changes in all these areas will reduce or eliminate all together the development of diabetes is stressed.      Latest Ref Rng & Units 10/16/2024    4:45 PM 06/15/2024   10:48 AM 05/31/2024   10:06 AM 05/24/2024   12:19 PM 05/17/2024    8:44 AM  Diabetic Labs  HbA1c 4.8 - 5.6 % 5.9       Chol 100 - 199 mg/dL 836       HDL >60 mg/dL 42       Calc LDL 0 - 99 mg/dL 82       Triglycerides 0 - 149 mg/dL 766       Creatinine 9.55 - 1.00 mg/dL  9.04  8.96  8.83  8.98  10/18/2024   12:57 PM 06/15/2024   10:46 AM 05/31/2024    2:03 PM 05/31/2024   10:00 AM 05/24/2024    3:24 PM 05/24/2024   12:48 PM 05/24/2024   12:22 PM  BP/Weight  Systolic BP 112 119 117 127 113  118  Diastolic BP 75 71 84 73 62  55  Wt. (Lbs) 178.04 188  183.86  185.2   BMI 28.74 kg/m2 30.34 kg/m2  29.68 kg/m2  29.89 kg/m2        No data to display          Improved which is great Updated lab needed at/ before next visit.   Overweight (BMI 25.0-29.9)  Patient re-educated about  the importance of commitment to a  minimum of 150 minutes of exercise per week as able.  The importance of healthy food choices with portion control discussed, as well as eating regularly and within a 12 hour window most days. The need to choose clean , green food 50 to 75% of  the time is discussed, as well as to make water  the primary drink and set a goal of 64 ounces water  daily.       10/18/2024   12:57 PM 06/15/2024   10:46 AM 05/31/2024   10:00 AM  Weight /BMI  Weight 178 lb 0.6 oz 188 lb 183 lb 13.8 oz  Height 5' 6 (1.676 m)    BMI 28.74 kg/m2 30.34 kg/m2 29.68 kg/m2    Improved with change in diet, she is applauded on this  Neuropathy Reports uncontrolled pain will inc lyrica  to take a directed and d/w oncology at visit next month  Essential hypertension Controlled, no change in medication DASH diet and commitment to daily physical activity for a minimum of 30 minutes discussed and encouraged, as a part of hypertension management. The importance of attaining a healthy weight is also discussed.     10/18/2024   12:57 PM 06/15/2024   10:46 AM 05/31/2024    2:03 PM 05/31/2024   10:00 AM 05/24/2024    3:24 PM 05/24/2024   12:48 PM 05/24/2024   12:22 PM  BP/Weight  Systolic BP 112 119 117 127 113  118  Diastolic BP 75 71 84 73 62  55  Wt. (Lbs) 178.04 188  183.86  185.2   BMI 28.74 kg/m2 30.34 kg/m2  29.68 kg/m2  29.89 kg/m2

## 2024-10-19 NOTE — Assessment & Plan Note (Signed)
 Hyperlipidemia:Low fat diet discussed and encouraged.   Lipid Panel  Lab Results  Component Value Date   CHOL 163 10/16/2024   HDL 42 10/16/2024   LDLCALC 82 10/16/2024   TRIG 233 (H) 10/16/2024   CHOLHDL 3.9 10/16/2024     Needs to reduce fat in diet, zetia added , continue lipitor Updated lab needed at/ before next visit.

## 2024-10-19 NOTE — Assessment & Plan Note (Signed)
 Reports uncontrolled pain will inc lyrica  to take a directed and d/w oncology at visit next month

## 2024-10-19 NOTE — Assessment & Plan Note (Signed)
 Controlled, no change in medication DASH diet and commitment to daily physical activity for a minimum of 30 minutes discussed and encouraged, as a part of hypertension management. The importance of attaining a healthy weight is also discussed.     10/18/2024   12:57 PM 06/15/2024   10:46 AM 05/31/2024    2:03 PM 05/31/2024   10:00 AM 05/24/2024    3:24 PM 05/24/2024   12:48 PM 05/24/2024   12:22 PM  BP/Weight  Systolic BP 112 119 117 127 113  118  Diastolic BP 75 71 84 73 62  55  Wt. (Lbs) 178.04 188  183.86  185.2   BMI 28.74 kg/m2 30.34 kg/m2  29.68 kg/m2  29.89 kg/m2

## 2024-10-19 NOTE — Assessment & Plan Note (Signed)
 Patient educated about the importance of limiting  Carbohydrate intake , the need to commit to daily physical activity for a minimum of 30 minutes , and to commit weight loss. The fact that changes in all these areas will reduce or eliminate all together the development of diabetes is stressed.      Latest Ref Rng & Units 10/16/2024    4:45 PM 06/15/2024   10:48 AM 05/31/2024   10:06 AM 05/24/2024   12:19 PM 05/17/2024    8:44 AM  Diabetic Labs  HbA1c 4.8 - 5.6 % 5.9       Chol 100 - 199 mg/dL 836       HDL >60 mg/dL 42       Calc LDL 0 - 99 mg/dL 82       Triglycerides 0 - 149 mg/dL 766       Creatinine 9.55 - 1.00 mg/dL  9.04  8.96  8.83  8.98       10/18/2024   12:57 PM 06/15/2024   10:46 AM 05/31/2024    2:03 PM 05/31/2024   10:00 AM 05/24/2024    3:24 PM 05/24/2024   12:48 PM 05/24/2024   12:22 PM  BP/Weight  Systolic BP 112 119 117 127 113  118  Diastolic BP 75 71 84 73 62  55  Wt. (Lbs) 178.04 188  183.86  185.2   BMI 28.74 kg/m2 30.34 kg/m2  29.68 kg/m2  29.89 kg/m2        No data to display          Improved which is great Updated lab needed at/ before next visit.

## 2024-10-23 ENCOUNTER — Encounter (INDEPENDENT_AMBULATORY_CARE_PROVIDER_SITE_OTHER): Payer: Self-pay | Admitting: *Deleted

## 2024-10-25 ENCOUNTER — Ambulatory Visit

## 2024-10-30 ENCOUNTER — Inpatient Hospital Stay: Attending: Hematology

## 2024-10-30 ENCOUNTER — Ambulatory Visit (HOSPITAL_COMMUNITY)
Admission: RE | Admit: 2024-10-30 | Discharge: 2024-10-30 | Disposition: A | Source: Ambulatory Visit | Attending: Hematology | Admitting: Hematology

## 2024-10-30 DIAGNOSIS — G6289 Other specified polyneuropathies: Secondary | ICD-10-CM | POA: Diagnosis not present

## 2024-10-30 DIAGNOSIS — Z79899 Other long term (current) drug therapy: Secondary | ICD-10-CM | POA: Diagnosis not present

## 2024-10-30 DIAGNOSIS — Z170 Estrogen receptor positive status [ER+]: Secondary | ICD-10-CM | POA: Diagnosis not present

## 2024-10-30 DIAGNOSIS — Z853 Personal history of malignant neoplasm of breast: Secondary | ICD-10-CM | POA: Diagnosis not present

## 2024-10-30 DIAGNOSIS — Z171 Estrogen receptor negative status [ER-]: Secondary | ICD-10-CM

## 2024-10-30 DIAGNOSIS — Z1231 Encounter for screening mammogram for malignant neoplasm of breast: Secondary | ICD-10-CM | POA: Insufficient documentation

## 2024-10-30 DIAGNOSIS — C50212 Malignant neoplasm of upper-inner quadrant of left female breast: Secondary | ICD-10-CM | POA: Diagnosis present

## 2024-10-30 LAB — CBC WITH DIFFERENTIAL/PLATELET
Abs Immature Granulocytes: 0 K/uL (ref 0.00–0.07)
Basophils Absolute: 0 K/uL (ref 0.0–0.1)
Basophils Relative: 0 %
Eosinophils Absolute: 0.2 K/uL (ref 0.0–0.5)
Eosinophils Relative: 4 %
HCT: 38.6 % (ref 36.0–46.0)
Hemoglobin: 12.8 g/dL (ref 12.0–15.0)
Immature Granulocytes: 0 %
Lymphocytes Relative: 25 %
Lymphs Abs: 1.1 K/uL (ref 0.7–4.0)
MCH: 31.1 pg (ref 26.0–34.0)
MCHC: 33.2 g/dL (ref 30.0–36.0)
MCV: 93.7 fL (ref 80.0–100.0)
Monocytes Absolute: 0.4 K/uL (ref 0.1–1.0)
Monocytes Relative: 9 %
Neutro Abs: 2.8 K/uL (ref 1.7–7.7)
Neutrophils Relative %: 62 %
Platelets: 268 K/uL (ref 150–400)
RBC: 4.12 MIL/uL (ref 3.87–5.11)
RDW: 14.2 % (ref 11.5–15.5)
WBC: 4.5 K/uL (ref 4.0–10.5)
nRBC: 0 % (ref 0.0–0.2)

## 2024-10-30 LAB — COMPREHENSIVE METABOLIC PANEL WITH GFR
ALT: 25 U/L (ref 0–44)
AST: 27 U/L (ref 15–41)
Albumin: 4.4 g/dL (ref 3.5–5.0)
Alkaline Phosphatase: 65 U/L (ref 38–126)
Anion gap: 10 (ref 5–15)
BUN: 18 mg/dL (ref 6–20)
CO2: 25 mmol/L (ref 22–32)
Calcium: 9.7 mg/dL (ref 8.9–10.3)
Chloride: 106 mmol/L (ref 98–111)
Creatinine, Ser: 0.95 mg/dL (ref 0.44–1.00)
GFR, Estimated: >60 mL/min (ref >60)
Glucose, Bld: 104 mg/dL — ABNORMAL HIGH (ref 70–99)
Potassium: 3.8 mmol/L (ref 3.5–5.1)
Sodium: 141 mmol/L (ref 135–145)
Total Bilirubin: 0.5 mg/dL (ref 0.0–1.2)
Total Protein: 7.1 g/dL (ref 6.5–8.1)

## 2024-10-30 LAB — MAGNESIUM: Magnesium: 2 mg/dL (ref 1.7–2.4)

## 2024-10-30 LAB — VITAMIN D 25 HYDROXY (VIT D DEFICIENCY, FRACTURES): Vit D, 25-Hydroxy: 43.02 ng/mL (ref 30–100)

## 2024-10-30 NOTE — Progress Notes (Signed)
 Patients port flushed without difficulty.  Good blood return noted with no bruising or swelling noted at site.  Labs drawn per orders.  VSS with discharge and left in satisfactory condition with no s/s of distress noted. All follow ups as scheduled.       Kierre Deines

## 2024-10-31 LAB — CANCER ANTIGEN 15-3: CA 15-3: 16.7 U/mL (ref 0.0–25.0)

## 2024-11-03 ENCOUNTER — Encounter: Payer: Self-pay | Admitting: Oncology

## 2024-11-07 ENCOUNTER — Inpatient Hospital Stay: Admitting: Oncology

## 2024-11-07 VITALS — BP 108/67 | HR 83 | Temp 98.7°F | Resp 18 | Wt 176.0 lb

## 2024-11-07 DIAGNOSIS — Z170 Estrogen receptor positive status [ER+]: Secondary | ICD-10-CM | POA: Diagnosis not present

## 2024-11-07 DIAGNOSIS — G6289 Other specified polyneuropathies: Secondary | ICD-10-CM

## 2024-11-07 DIAGNOSIS — C50212 Malignant neoplasm of upper-inner quadrant of left female breast: Secondary | ICD-10-CM

## 2024-11-07 NOTE — Progress Notes (Signed)
 [facility] OFFICE PROGRESS NOTE  Meredith Rollene BRAVO, MD  ASSESSMENT & PLAN:    Assessment & Plan Malignant neoplasm of upper-inner quadrant of left breast in female, estrogen receptor positive (HCC) - She completed week 12 of paclitaxel  on 05/31/2024. - Met with radiation oncology on 06/20/2024 who did not recommend any additional treatment with radiation due to negative lymph nodes, tumor size under 5 cm and adequate surgical margins. - Reviewed labs from 10/30/2024: Normal LFTs.  CBC is unremarkable with recovery of white blood cell count and hemoglobin since his last visit 4 months ago. - Most recent  mammogram is from 11/02/2024 which was BI-RADS Category 1 negative.  Repeat annually. - I have also recommended port flush every 3-4 months.  RTC 4 months with repeat labs and exam. -As long as everything remains stable, can move her out to every 6 months at her next visit. Other polyneuropathy - Continue Lyrica  75 mg twice daily.  She has occasional burning sensation of the toes particularly at nighttime.  I have given her amitriptyline  25 mg at bedtime which she could not tolerate due to drowsiness.  She can take half tablet of amitriptyline  at bedtime as needed.  Hypomagnesemia - Resolved.  Orders Placed This Encounter  Procedures   CBC with Differential    Standing Status:   Future    Expected Date:   03/04/2025    Expiration Date:   06/02/2025   Comprehensive metabolic panel    Standing Status:   Future    Expected Date:   03/04/2025    Expiration Date:   06/02/2025   Magnesium     Standing Status:   Future    Expected Date:   03/04/2025    Expiration Date:   06/02/2025   Cancer antigen 15-3    Standing Status:   Future    Expected Date:   03/04/2025    Expiration Date:   06/02/2025   VITAMIN D  25 Hydroxy (Vit-D Deficiency, Fractures)    Standing Status:   Future    Expected Date:   03/04/2025    Expiration Date:   06/02/2025    INTERVAL HISTORY: Patient returns for  follow-up for history of left breast cancer.  Since her last visit, she met with radiation oncology on 06/20/2024 who did not recommend any radiation due to negative lymph nodes, tumor size and adequate surgical margins.  She denies any hospitalizations, surgeries or changes to baseline health.  Denies any new concerning symptoms for recurrence of disease.  Overall, is doing well.  She meets with her PCP regularly for checkups. She continues Lyrica  for neuropathy in fingers and toes but worse in her fingers.  No adverse side effects noted.  Reports she has not formally met with the lymphedema clinic but she does receive special balls given her mastectomy.  She denies any pain to her mastectomy site although it feels numb.  Denies any lymphedema in her left arm.  We reviewed CBC, CMP, magnesium  and CEA 15 3.  SUMMARY OF HEMATOLOGIC HISTORY: Oncology History  Breast cancer of upper-inner quadrant of left female breast (HCC)  11/15/2023 Initial Diagnosis   Breast cancer of upper-inner quadrant of left female breast (HCC)    Genetic Testing   UPDATE: BRIP1 c.3196del downgraded to VUS.  This is no longer a likely pathogenic variant.    Likely pathogenic variant in BRIP1 called c.3196del (p.Ser1066Hisfs*12) identified on the [facility] Common Hereditary Cancers+RNA panel. VUS in CTNNA1 called c.1070G>A (p.Arg357His) identified. The  report date is 11/29/2023.  The Common Hereditary Cancers Panel + RNA offered by [facility] includes sequencing and/or deletion duplication testing of the following 48 genes: APC*, ATM*, AXIN2, BAP1, BARD1, BMPR1A, BRCA1, BRCA2, BRIP1, CDH1, CDK4, CDKN2A (p14ARF), CDKN2A (p16INK4a), CHEK2, CTNNA1, DICER1*, EPCAM*, FH*, GREM1*, HOXB13, KIT, MBD4, MEN1*, MLH1*, MSH2*, MSH3*, MSH6*, MUTYH, NF1*, NTHL1, PALB2, PDGFRA, PMS2*, POLD1*, POLE, PTEN*, RAD51C, RAD51D, SDHA*, SDHB, SDHC*, SDHD, SMAD4, SMARCA4, STK11, TP53, TSC1*, TSC2, VHL.    11/15/2023 Cancer Staging   Staging form:  Breast, AJCC 8th Edition - Clinical stage from 11/15/2023: Stage IIB (cT2, cN0(sn), cM0, G3, ER-, PR-, HER2-) - Signed by Rogers Hai, MD on 01/05/2024 Histopathologic type: Infiltrating duct carcinoma, NOS Stage prefix: Initial diagnosis Method of lymph node assessment: Sentinel lymph node biopsy Nuclear grade: G3 Histologic grading system: 3 grade system   01/19/2024 -  Chemotherapy   Patient is on Treatment Plan : BREAST DOSE DENSE AC q14d / PACLitaxel  q7d      1.  T1 cN0 G3 ER/PR/HER2-left breast IDC: - Screening mammogram on 10/20/2022: Abnormal - Left breast diagnostic mammogram/ultrasound (10/26/2023): Linear oriented pleomorphic calcifications in the lower inner left breast span 5.9 cm.  There has been interval development of irregular mass associated with these calcifications in the lower inner left breast.  Ultrasound of the left breast shows irregular hypoechoic mass in the left breast at 9 o'clock position measuring 2 x 1.3 x 1.6 cm.  No abnormal lymph nodes in the left axilla. - Left breast 9:00 biopsy (11/02/2023): Poorly differentiated IDC, grade 3, ER/PR negative, HER2 (1+) by IHC.  Ki-67: 20%. - Left breast biopsy LIQ posterior extent and anterior extent (11/17/2023): DCIS, comedo, high nuclear grade. - PET scan (11/25/2023): No evidence of metastatic disease. - Left mastectomy and SLNB on 12/08/2023 by Dr. Kallie - Pathology: 3.9 x 2.2 x 1.5 cm invasive poorly differentiated ductal adenocarcinoma, grade 3 with extensive necrosis, high-grade DCIS with necrosis, margins free, negative angiolymphatic invasion.  0/6 lymph nodes involved.  pT2 pN0. - Adjuvant chemotherapy with dose dense AC x 4 followed by weekly paclitaxel  x 12 was recommended. - 4 cycles of dose dense AC from 01/19/2024 through 03/01/2024 - Weekly paclitaxel  started on 03/15/2024, week 12 completed on 05/31/2024   2. Social/Family History: -Lives at home with her daughter. Works full-time with mild physical  activity at work. No tobacco use.  -Brother died of lung cancer in his early 84's with a history of tobacco use. No other family history of cancer.   3. BRIP1 heterozygosity: - We reviewed genetic testing results which was positive for BRIP1 mutation, putting her at high risk for ovarian cancer.  Also at high risk for Fanconi anemia. - She had unilateral salpingo-oophorectomy more than 10 years ago.  I have recommended salpingo-oophorectomy of the remaining ovary.   also recommended that her daughters be tested for the same mutation.  CBC    Component Value Date/Time   WBC 4.5 10/30/2024 1025   RBC 4.12 10/30/2024 1025   HGB 12.8 10/30/2024 1025   HGB 12.7 04/09/2023 1417   HCT 38.6 10/30/2024 1025   HCT 36.4 04/09/2023 1417   PLT 268 10/30/2024 1025   PLT 359 04/09/2023 1417   MCV 93.7 10/30/2024 1025   MCV 91 04/09/2023 1417   MCH 31.1 10/30/2024 1025   MCHC 33.2 10/30/2024 1025   RDW 14.2 10/30/2024 1025   RDW 13.7 04/09/2023 1417   LYMPHSABS 1.1 10/30/2024 1025   MONOABS 0.4 10/30/2024 1025  EOSABS 0.2 10/30/2024 1025   BASOSABS 0.0 10/30/2024 1025       Latest Ref Rng & Units 10/30/2024   10:25 AM 06/15/2024   10:48 AM 05/31/2024   10:06 AM  CMP  Glucose 70 - 99 mg/dL 895  834  813   BUN 6 - 20 mg/dL 18  20  19    Creatinine 0.44 - 1.00 mg/dL 9.04  9.04  8.96   Sodium 135 - 145 mmol/L 141  140  140   Potassium 3.5 - 5.1 mmol/L 3.8  3.1  3.1   Chloride 98 - 111 mmol/L 106  106  105   CO2 22 - 32 mmol/L 25  22  20    Calcium  8.9 - 10.3 mg/dL 9.7  9.5  9.4   Total Protein 6.5 - 8.1 g/dL 7.1  7.2  7.0   Total Bilirubin 0.0 - 1.2 mg/dL 0.5  0.8  0.8   Alkaline Phos 38 - 126 U/L 65  51  49   AST 15 - 41 U/L 27  29  32   ALT 0 - 44 U/L 25  28  35      Lab Results  Component Value Date   FERRITIN 331 (H) 03/29/2024   VITAMINB12 668 07/24/2019    Vitals:   11/07/24 1129  BP: 108/67  Pulse: 83  Resp: 18  Temp: 98.7 F (37.1 C)  SpO2: 99%    Review of  System:  Review of Systems  Neurological:  Positive for tingling and sensory change.    Physical Exam: Physical Exam Constitutional:      Appearance: Normal appearance.  HENT:     Head: Normocephalic and atraumatic.  Eyes:     Pupils: Pupils are equal, round, and reactive to light.  Cardiovascular:     Rate and Rhythm: Normal rate and regular rhythm.     Heart sounds: Normal heart sounds. No murmur heard. Pulmonary:     Effort: Pulmonary effort is normal.     Breath sounds: Normal breath sounds. No wheezing.  Chest:  Breasts:    Left: Absent.     Comments: Left mastectomy scar line WNL.  No lymphadenopathy. Right breast WNL.  Abdominal:     General: Bowel sounds are normal. There is no distension.     Palpations: Abdomen is soft.     Tenderness: There is no abdominal tenderness.  Musculoskeletal:        General: Normal range of motion.     Cervical back: Normal range of motion.  Skin:    General: Skin is warm and dry.     Findings: No rash.  Neurological:     Mental Status: She is alert and oriented to person, place, and time.     Gait: Gait is intact.  Psychiatric:        Mood and Affect: Mood and affect normal.        Cognition and Memory: Memory normal.        Judgment: Judgment normal.      I spent 20 minutes dedicated to the care of this patient (face-to-face and non-face-to-face) on the date of the encounter to include what is described in the assessment and plan.,  Delon Hope, NP 11/07/2024 11:52 AM

## 2024-11-07 NOTE — Assessment & Plan Note (Addendum)
 Resolved

## 2024-11-07 NOTE — Assessment & Plan Note (Addendum)
-   Continue Lyrica  75 mg twice daily.  She has occasional burning sensation of the toes particularly at nighttime.  I have given her amitriptyline  25 mg at bedtime which she could not tolerate due to drowsiness.  She can take half tablet of amitriptyline  at bedtime as needed.

## 2024-11-07 NOTE — Assessment & Plan Note (Addendum)
-   She completed week 12 of paclitaxel  on 05/31/2024. - Met with radiation oncology on 06/20/2024 who did not recommend any additional treatment with radiation due to negative lymph nodes, tumor size under 5 cm and adequate surgical margins. - Reviewed labs from 10/30/2024: Normal LFTs.  CBC is unremarkable with recovery of white blood cell count and hemoglobin since his last visit 4 months ago. - Most recent  mammogram is from 11/02/2024 which was BI-RADS Category 1 negative.  Repeat annually. - I have also recommended port flush every 3-4 months.  RTC 4 months with repeat labs and exam. -As long as everything remains stable, can move her out to every 6 months at her next visit.

## 2024-11-27 ENCOUNTER — Encounter: Payer: Self-pay | Admitting: *Deleted

## 2024-12-04 ENCOUNTER — Telehealth (INDEPENDENT_AMBULATORY_CARE_PROVIDER_SITE_OTHER): Admitting: Family Medicine

## 2024-12-04 ENCOUNTER — Encounter: Payer: Self-pay | Admitting: Family Medicine

## 2024-12-04 DIAGNOSIS — J209 Acute bronchitis, unspecified: Secondary | ICD-10-CM

## 2024-12-04 MED ORDER — HYDROCOD POLI-CHLORPHE POLI ER 10-8 MG/5ML PO SUER: 5.0000 mL | Freq: Every evening | ORAL | 0 refills | Status: AC | PRN

## 2024-12-04 MED ORDER — ALBUTEROL SULFATE HFA 108 (90 BASE) MCG/ACT IN AERS: 2.0000 | INHALATION_SPRAY | Freq: Four times a day (QID) | RESPIRATORY_TRACT | 0 refills | Status: AC | PRN

## 2024-12-04 MED ORDER — AZITHROMYCIN 250 MG PO TABS: ORAL_TABLET | ORAL | 0 refills | Status: AC

## 2024-12-04 NOTE — Progress Notes (Signed)
 "  Virtual Visit via Video Note  Assessment and Plan: Acute bronchitis, unspecified organism - Plan: albuterol  (VENTOLIN  HFA) 108 (90 Base) MCG/ACT inhaler  Follow Up Instructions: Follow up with PCP if not improving Return if symptoms worsen or fail to improve.   I discussed the assessment and treatment plan with the patient. The patient was provided an opportunity to ask questions, and all were answered. The patient agreed with the plan and demonstrated an understanding of the instructions.   The patient was advised to call back or seek an in-person evaluation if the symptoms worsen or if the condition fails to improve as anticipated.  The above assessment and management plan was discussed with the patient. The patient verbalized understanding of and has agreed to the management plan.   Connie Emperor, MD  I connected with Meredith Sanchez on 12/04/2024 at  3:40 PM EST by a video enabled telemedicine application and verified that I am speaking with the correct person using two identifiers.  Patient Location: Home Provider Location: Home Office  I discussed the limitations, risks, security, and privacy concerns of performing an evaluation and management service by video and the availability of in person appointments. I also discussed with the patient that there may be a patient responsible charge related to this service. The patient expressed understanding and agreed to proceed.  Subjective: PCP: [name], MD  No chief complaint on file.   History of Present Illness Meredith Sanchez is a 59 year old female who presents with persistent cough and congestion since Christmas.  She has experienced persistent cough and congestion since Christmas, beginning with nasal congestion and throat irritation. Her chest feels congested, and the cough is productive of phlegm that was initially cloudy. No shortness of breath, fever, or chills, but she reports wheezing during this episode.  She has not used an inhaler recently.  She has been using Mucinex and Theraflu without relief. Allergy medications, including a steroid nasal spray, have also been ineffective. She denies known exposure to COVID, flu, or RSV, although she works around people.  She is currently off work this week due to her illness. She has a history of breast cancer Her last appointment in November was a regular follow-up, and everything was reported as great at that time.  No smoking history is reported.  Assessment and Plan Acute bronchitis Persistent cough with phlegm and wheezing, unresponsive to OTC medications. No fever or respiratory distress. No smoking history or known viral exposure. Desenex may not be covered by insurance. - Prescribed Z-Pak (azithromycin ). - Prescribed Desenex for nighttime cough, consider Tessalon  Perles if not covered. - Prescribed inhaler for wheezing.  History of breast cancer Recent follow-up in November showed no issues.   ROS: Per HPI Current Medications[1]  Social History   Socioeconomic History   Marital status: Married    Spouse name: Not on file   Number of children: 3   Years of education: Not on file   Highest education level: Not on file  Occupational History   Occupation: employed     Comment: [employer]  Tobacco Use   Smoking status: Never   Smokeless tobacco: Never  Vaping Use   Vaping status: Never Used  Substance and Sexual Activity   Alcohol use: No   Drug use: No   Sexual activity: Not on file  Other Topics Concern   Not on file  Social History Narrative   Not on file   Social Drivers of Health  Tobacco Use: Low Risk (12/04/2024)   Patient History    Smoking Tobacco Use: Never    Smokeless Tobacco Use: Never    Passive Exposure: Not on file  Financial Resource Strain: High Risk (01/19/2024)   Overall Financial Resource Strain (CARDIA)    Difficulty of Paying Living Expenses: Very hard  Food Insecurity: Food Insecurity Present  (06/20/2024)   Received from [facility]   Epic    Within the past 12 months, you worried that your food would run out before you got the money to buy more.: Sometimes true    Within the past 12 months, the food you bought just didn't last and you didn't have money to get more.: Never true  Transportation Needs: No Transportation Needs (06/20/2024)   Received from [facility]   PRAPARE - Transportation    Lack of Transportation (Medical): No    Lack of Transportation (Non-Medical): No  Physical Activity: Not on file  Stress: Not on file  Social Connections: Not on file  Intimate Partner Violence: Patient Declined (12/08/2023)   Humiliation, Afraid, Rape, and Kick questionnaire    Fear of Current or Ex-Partner: Patient declined    Emotionally Abused: Patient declined    Physically Abused: Patient declined    Sexually Abused: Patient declined  Depression (PHQ2-9): Low Risk (11/07/2024)   Depression (PHQ2-9)    PHQ-2 Score: 0  Alcohol Screen: Not on file  Housing: Unknown (12/08/2023)   Housing Stability Vital Sign    Unable to Pay for Housing in the Last Year: No    Number of Times Moved in the Last Year: Not on file    Homeless in the Last Year: No  Utilities: Low Risk (06/20/2024)   Received from [facility]   Utilities    Within the past 12 months, have you been unable to get utilities(heat, electricity) when it was really needed?: No  Health Literacy: Not on file     Observations/Objective: There were no vitals filed for this visit. Physical Exam Constitutional:      General: She is not in acute distress. HENT:     Head: Normocephalic and atraumatic.  Neurological:     General: No focal deficit present.     Mental Status: She is alert.       [1]  Current Outpatient Medications:    albuterol  (VENTOLIN  HFA) 108 (90 Base) MCG/ACT inhaler, Inhale 2 puffs into the lungs every 6 (six) hours as needed for wheezing or shortness of breath., Disp: 1 each, Rfl: 0    azithromycin  (ZITHROMAX ) 250 MG tablet, Take 2 tablets on day 1, then 1 tablet daily on days 2 through 5, Disp: 6 tablet, Rfl: 0   chlorpheniramine-HYDROcodone (TUSSIONEX) 10-8 MG/5ML, Take 5 mLs by mouth at bedtime as needed for cough. Take at night time, Disp: 120 mL, Rfl: 0   amitriptyline  (ELAVIL ) 25 MG tablet, Take 1 tablet (25 mg total) by mouth at bedtime., Disp: 30 tablet, Rfl: 1   amlodipine -olmesartan  (AZOR ) 10-20 MG tablet, Take 1 tablet by mouth daily., Disp: 90 tablet, Rfl: 3   APPLE CIDER VINEGAR PO, Take 1 capsule by mouth daily., Disp: , Rfl:    Ascorbic Acid (VITAMIN C PO), Take 1 capsule by mouth daily., Disp: , Rfl:    ASHWAGANDHA PO, Take 2 tablets by mouth daily., Disp: , Rfl:    ASPIRIN  81 PO, Take 1 tablet by mouth daily., Disp: , Rfl:    atorvastatin  (LIPITOR) 40 MG tablet, Take 1  tablet (40 mg total) by mouth daily., Disp: 90 tablet, Rfl: 3   docusate sodium  (COLACE) 100 MG capsule, Take 1 capsule (100 mg total) by mouth 2 (two) times daily as needed for mild constipation., Disp: 30 capsule, Rfl: 0   ezetimibe  (ZETIA ) 10 MG tablet, Take 1 tablet (10 mg total) by mouth daily., Disp: 90 tablet, Rfl: 2   famotidine  (PEPCID ) 40 MG tablet, Take 1 tablet (40 mg total) by mouth daily., Disp: 30 tablet, Rfl: 5   furosemide  (LASIX ) 20 MG tablet, Take 1 tablet (20 mg total) by mouth daily as needed for edema., Disp: 30 tablet, Rfl: 2   lidocaine -prilocaine  (EMLA ) cream, Apply to affected area once, Disp: 30 g, Rfl: 3   loratadine  (CLARITIN ) 10 MG tablet, Take 1 tablet (10 mg total) by mouth daily., Disp: 90 tablet, Rfl: 3   magnesium  oxide (MAG-OX) 400 (240 Mg) MG tablet, Take 1 tablet (400 mg total) by mouth daily., Disp: 30 tablet, Rfl: 3   Multiple Vitamin (MULTIVITAMIN) tablet, Take 1 tablet by mouth daily., Disp: , Rfl:    ondansetron  (ZOFRAN -ODT) 4 MG disintegrating tablet, Take 1 tablet (4 mg total) by mouth every 6 (six) hours as needed for nausea., Disp: 20 tablet, Rfl: 0    PACLitaxel  (TAXOL  IV), Inject into the vein., Disp: , Rfl:    Pegfilgrastim  (NEULASTA  [place]), Inject into the skin., Disp: , Rfl:    pregabalin  (LYRICA ) 75 MG capsule, Take 1 capsule (75 mg total) by mouth 3 (three) times daily., Disp: 90 capsule, Rfl: 3   prochlorperazine  (COMPAZINE ) 10 MG tablet, Take 1 tablet (10 mg total) by mouth every 6 (six) hours as needed for nausea or vomiting., Disp: 60 tablet, Rfl: 3   triamterene -hydrochlorothiazide  (MAXZIDE -25) 37.5-25 MG tablet, Take 1 tablet by mouth daily., Disp: 90 tablet, Rfl: 3  "

## 2025-01-02 ENCOUNTER — Other Ambulatory Visit: Payer: Self-pay | Admitting: Family Medicine

## 2025-01-02 DIAGNOSIS — J209 Acute bronchitis, unspecified: Secondary | ICD-10-CM

## 2025-03-14 ENCOUNTER — Inpatient Hospital Stay

## 2025-03-14 ENCOUNTER — Inpatient Hospital Stay: Admitting: Oncology

## 2025-04-18 ENCOUNTER — Ambulatory Visit: Admitting: Family Medicine
